# Patient Record
Sex: Female | Born: 1935 | Race: Black or African American | Hispanic: No | Smoking: Never smoker
Health system: Southern US, Community
[De-identification: ages and names within clinical notes are randomized; demographics above are authoritative.]

## PROBLEM LIST (undated history)

## (undated) DIAGNOSIS — I4891 Unspecified atrial fibrillation: Secondary | ICD-10-CM

## (undated) DIAGNOSIS — I5022 Chronic systolic (congestive) heart failure: Secondary | ICD-10-CM

## (undated) DIAGNOSIS — I639 Cerebral infarction, unspecified: Secondary | ICD-10-CM

## (undated) DIAGNOSIS — I10 Essential (primary) hypertension: Secondary | ICD-10-CM

## (undated) DIAGNOSIS — R001 Bradycardia, unspecified: Secondary | ICD-10-CM

## (undated) DIAGNOSIS — M109 Gout, unspecified: Secondary | ICD-10-CM

## (undated) DIAGNOSIS — E119 Type 2 diabetes mellitus without complications: Secondary | ICD-10-CM

## (undated) DIAGNOSIS — I350 Nonrheumatic aortic (valve) stenosis: Secondary | ICD-10-CM

## (undated) DIAGNOSIS — K219 Gastro-esophageal reflux disease without esophagitis: Secondary | ICD-10-CM

## (undated) HISTORY — DX: Unspecified atrial fibrillation: I48.91

## (undated) HISTORY — DX: Cerebral infarction, unspecified: I63.9

## (undated) HISTORY — DX: Nonrheumatic aortic (valve) stenosis: I35.0

## (undated) HISTORY — PX: CHOLECYSTECTOMY: SHX55

---

## 1999-04-24 ENCOUNTER — Emergency Department (HOSPITAL_COMMUNITY): Admission: EM | Admit: 1999-04-24 | Discharge: 1999-04-24 | Payer: Self-pay | Admitting: Emergency Medicine

## 1999-07-16 ENCOUNTER — Ambulatory Visit (HOSPITAL_COMMUNITY): Admission: RE | Admit: 1999-07-16 | Discharge: 1999-07-16 | Payer: Self-pay | Admitting: Internal Medicine

## 1999-07-16 ENCOUNTER — Encounter: Payer: Self-pay | Admitting: Internal Medicine

## 2001-07-14 ENCOUNTER — Emergency Department (HOSPITAL_COMMUNITY): Admission: EM | Admit: 2001-07-14 | Discharge: 2001-07-14 | Payer: Self-pay | Admitting: Emergency Medicine

## 2002-12-03 ENCOUNTER — Emergency Department (HOSPITAL_COMMUNITY): Admission: EM | Admit: 2002-12-03 | Discharge: 2002-12-03 | Payer: Self-pay | Admitting: Emergency Medicine

## 2003-06-24 ENCOUNTER — Emergency Department (HOSPITAL_COMMUNITY): Admission: EM | Admit: 2003-06-24 | Discharge: 2003-06-24 | Payer: Self-pay | Admitting: Emergency Medicine

## 2004-01-16 ENCOUNTER — Ambulatory Visit (HOSPITAL_COMMUNITY): Admission: RE | Admit: 2004-01-16 | Discharge: 2004-01-16 | Payer: Self-pay | Admitting: Internal Medicine

## 2006-06-02 ENCOUNTER — Inpatient Hospital Stay (HOSPITAL_COMMUNITY): Admission: AD | Admit: 2006-06-02 | Discharge: 2006-06-07 | Payer: Self-pay | Admitting: Internal Medicine

## 2006-06-03 ENCOUNTER — Ambulatory Visit: Payer: Self-pay | Admitting: Internal Medicine

## 2007-06-30 ENCOUNTER — Ambulatory Visit: Payer: Self-pay | Admitting: Internal Medicine

## 2007-07-13 ENCOUNTER — Ambulatory Visit: Payer: Self-pay

## 2007-07-13 ENCOUNTER — Encounter: Payer: Self-pay | Admitting: Internal Medicine

## 2007-07-13 ENCOUNTER — Ambulatory Visit: Payer: Self-pay | Admitting: Internal Medicine

## 2007-07-18 ENCOUNTER — Ambulatory Visit: Payer: Self-pay | Admitting: Cardiology

## 2007-07-21 ENCOUNTER — Ambulatory Visit: Payer: Self-pay | Admitting: Cardiology

## 2007-07-26 ENCOUNTER — Ambulatory Visit: Payer: Self-pay | Admitting: Cardiovascular Disease

## 2007-08-05 ENCOUNTER — Ambulatory Visit: Payer: Self-pay | Admitting: Cardiology

## 2007-08-23 ENCOUNTER — Ambulatory Visit: Payer: Self-pay | Admitting: Cardiology

## 2007-08-30 ENCOUNTER — Ambulatory Visit: Payer: Self-pay | Admitting: Cardiovascular Disease

## 2007-09-06 ENCOUNTER — Ambulatory Visit: Payer: Self-pay | Admitting: Cardiology

## 2007-09-20 ENCOUNTER — Ambulatory Visit: Payer: Self-pay | Admitting: Cardiology

## 2007-09-28 ENCOUNTER — Ambulatory Visit: Payer: Self-pay | Admitting: Internal Medicine

## 2007-09-28 LAB — CONVERTED CEMR LAB
BUN: 19 mg/dL (ref 6–23)
Basophils Relative: 0.4 % (ref 0.0–3.0)
CO2: 26 meq/L (ref 19–32)
Chloride: 109 meq/L (ref 96–112)
Creatinine, Ser: 1.1 mg/dL (ref 0.4–1.2)
Eosinophils Absolute: 0.2 10*3/uL (ref 0.0–0.7)
Eosinophils Relative: 2.3 % (ref 0.0–5.0)
GFR calc non Af Amer: 51 mL/min
Glucose, Bld: 104 mg/dL — ABNORMAL HIGH (ref 70–99)
Lymphocytes Relative: 33.2 % (ref 12.0–46.0)
MCV: 89.3 fL (ref 78.0–100.0)
Monocytes Relative: 6.5 % (ref 3.0–12.0)
Neutrophils Relative %: 57.6 % (ref 43.0–77.0)
Platelets: 281 10*3/uL (ref 150–400)
Potassium: 4 meq/L (ref 3.5–5.1)
RBC: 3.67 M/uL — ABNORMAL LOW (ref 3.87–5.11)
WBC: 7.7 10*3/uL (ref 4.5–10.5)

## 2007-10-07 ENCOUNTER — Ambulatory Visit: Payer: Self-pay | Admitting: Cardiology

## 2007-10-28 ENCOUNTER — Ambulatory Visit: Payer: Self-pay | Admitting: Cardiology

## 2007-11-04 ENCOUNTER — Ambulatory Visit: Payer: Self-pay | Admitting: Cardiology

## 2007-11-14 ENCOUNTER — Ambulatory Visit: Payer: Self-pay | Admitting: Cardiology

## 2007-11-23 ENCOUNTER — Ambulatory Visit: Payer: Self-pay | Admitting: Cardiovascular Disease

## 2007-12-02 ENCOUNTER — Ambulatory Visit: Payer: Self-pay | Admitting: Internal Medicine

## 2007-12-09 ENCOUNTER — Ambulatory Visit: Payer: Self-pay | Admitting: Cardiovascular Disease

## 2007-12-20 ENCOUNTER — Ambulatory Visit: Payer: Self-pay | Admitting: Cardiovascular Disease

## 2007-12-28 ENCOUNTER — Ambulatory Visit: Payer: Self-pay | Admitting: Internal Medicine

## 2008-01-11 ENCOUNTER — Ambulatory Visit: Payer: Self-pay | Admitting: Cardiovascular Disease

## 2008-01-25 ENCOUNTER — Ambulatory Visit: Payer: Self-pay | Admitting: Cardiology

## 2008-02-01 ENCOUNTER — Ambulatory Visit: Payer: Self-pay | Admitting: Cardiology

## 2008-02-08 ENCOUNTER — Ambulatory Visit: Payer: Self-pay | Admitting: Internal Medicine

## 2008-03-01 ENCOUNTER — Ambulatory Visit: Payer: Self-pay | Admitting: Cardiovascular Disease

## 2008-03-07 ENCOUNTER — Ambulatory Visit: Payer: Self-pay | Admitting: Cardiology

## 2008-03-19 ENCOUNTER — Ambulatory Visit: Payer: Self-pay | Admitting: Cardiology

## 2008-03-28 ENCOUNTER — Ambulatory Visit: Payer: Self-pay | Admitting: Internal Medicine

## 2008-04-04 ENCOUNTER — Ambulatory Visit: Payer: Self-pay | Admitting: Cardiology

## 2008-04-13 ENCOUNTER — Ambulatory Visit: Payer: Self-pay | Admitting: Cardiology

## 2008-04-24 ENCOUNTER — Ambulatory Visit: Payer: Self-pay | Admitting: Cardiovascular Disease

## 2008-05-08 ENCOUNTER — Ambulatory Visit: Payer: Self-pay | Admitting: Cardiology

## 2008-05-18 ENCOUNTER — Ambulatory Visit: Payer: Self-pay | Admitting: Cardiology

## 2008-05-29 ENCOUNTER — Ambulatory Visit: Payer: Self-pay | Admitting: Cardiology

## 2008-06-13 ENCOUNTER — Ambulatory Visit: Payer: Self-pay | Admitting: Internal Medicine

## 2008-06-19 ENCOUNTER — Encounter: Payer: Self-pay | Admitting: *Deleted

## 2008-06-27 ENCOUNTER — Ambulatory Visit: Payer: Self-pay

## 2008-06-27 LAB — CONVERTED CEMR LAB
POC INR: 1.3
Protime: 13.9

## 2008-07-11 ENCOUNTER — Ambulatory Visit: Payer: Self-pay | Admitting: Internal Medicine

## 2008-07-11 LAB — CONVERTED CEMR LAB: POC INR: 1.7

## 2008-07-25 ENCOUNTER — Encounter: Payer: Self-pay | Admitting: *Deleted

## 2008-08-01 ENCOUNTER — Ambulatory Visit: Payer: Self-pay | Admitting: Internal Medicine

## 2008-08-01 LAB — CONVERTED CEMR LAB
POC INR: 2
Prothrombin Time: 17.3 s

## 2008-08-15 ENCOUNTER — Ambulatory Visit: Payer: Self-pay | Admitting: Cardiology

## 2008-08-31 ENCOUNTER — Ambulatory Visit: Payer: Self-pay | Admitting: Internal Medicine

## 2008-08-31 LAB — CONVERTED CEMR LAB: POC INR: 4.9

## 2008-09-07 ENCOUNTER — Ambulatory Visit: Payer: Self-pay | Admitting: Cardiology

## 2008-09-21 ENCOUNTER — Ambulatory Visit: Payer: Self-pay | Admitting: Internal Medicine

## 2008-10-05 ENCOUNTER — Ambulatory Visit: Payer: Self-pay | Admitting: Internal Medicine

## 2008-10-05 LAB — CONVERTED CEMR LAB: POC INR: 1.3

## 2008-10-19 ENCOUNTER — Ambulatory Visit: Payer: Self-pay | Admitting: Internal Medicine

## 2008-10-19 LAB — CONVERTED CEMR LAB: POC INR: 1.3

## 2008-10-24 ENCOUNTER — Ambulatory Visit: Payer: Self-pay | Admitting: Internal Medicine

## 2008-10-24 DIAGNOSIS — I4891 Unspecified atrial fibrillation: Secondary | ICD-10-CM | POA: Insufficient documentation

## 2008-10-24 DIAGNOSIS — I359 Nonrheumatic aortic valve disorder, unspecified: Secondary | ICD-10-CM | POA: Insufficient documentation

## 2008-10-24 DIAGNOSIS — I498 Other specified cardiac arrhythmias: Secondary | ICD-10-CM

## 2008-10-24 DIAGNOSIS — I119 Hypertensive heart disease without heart failure: Secondary | ICD-10-CM | POA: Insufficient documentation

## 2008-11-02 ENCOUNTER — Ambulatory Visit: Payer: Self-pay | Admitting: Internal Medicine

## 2008-11-07 ENCOUNTER — Telehealth (INDEPENDENT_AMBULATORY_CARE_PROVIDER_SITE_OTHER): Payer: Self-pay | Admitting: *Deleted

## 2008-11-08 ENCOUNTER — Ambulatory Visit: Payer: Self-pay

## 2008-11-08 ENCOUNTER — Ambulatory Visit: Payer: Self-pay | Admitting: Cardiology

## 2008-11-08 ENCOUNTER — Encounter: Payer: Self-pay | Admitting: Internal Medicine

## 2008-11-08 ENCOUNTER — Ambulatory Visit (HOSPITAL_COMMUNITY): Admission: RE | Admit: 2008-11-08 | Discharge: 2008-11-08 | Payer: Self-pay | Admitting: Cardiology

## 2008-11-16 ENCOUNTER — Ambulatory Visit: Payer: Self-pay | Admitting: Internal Medicine

## 2008-11-16 LAB — CONVERTED CEMR LAB: POC INR: 2.7

## 2008-12-04 ENCOUNTER — Ambulatory Visit: Payer: Self-pay | Admitting: Internal Medicine

## 2008-12-04 ENCOUNTER — Ambulatory Visit: Payer: Self-pay | Admitting: Cardiology

## 2008-12-11 ENCOUNTER — Ambulatory Visit: Payer: Self-pay | Admitting: Cardiovascular Disease

## 2008-12-21 ENCOUNTER — Ambulatory Visit: Payer: Self-pay | Admitting: Cardiovascular Disease

## 2009-01-09 ENCOUNTER — Ambulatory Visit: Payer: Self-pay | Admitting: Internal Medicine

## 2009-01-09 LAB — CONVERTED CEMR LAB: POC INR: 3.1

## 2009-02-12 ENCOUNTER — Ambulatory Visit: Payer: Self-pay | Admitting: Cardiovascular Disease

## 2009-02-12 LAB — CONVERTED CEMR LAB: POC INR: 1.4

## 2009-02-26 ENCOUNTER — Ambulatory Visit: Payer: Self-pay | Admitting: Cardiology

## 2009-03-12 ENCOUNTER — Ambulatory Visit: Payer: Self-pay | Admitting: Cardiology

## 2009-03-12 LAB — CONVERTED CEMR LAB: POC INR: 1.5

## 2009-03-22 ENCOUNTER — Ambulatory Visit: Payer: Self-pay | Admitting: Cardiology

## 2009-03-22 LAB — CONVERTED CEMR LAB: POC INR: 2.2

## 2009-04-04 ENCOUNTER — Ambulatory Visit: Payer: Self-pay | Admitting: Internal Medicine

## 2009-04-04 LAB — CONVERTED CEMR LAB: POC INR: 1.8

## 2009-05-02 ENCOUNTER — Ambulatory Visit: Payer: Self-pay | Admitting: Internal Medicine

## 2009-05-30 ENCOUNTER — Ambulatory Visit: Payer: Self-pay | Admitting: Internal Medicine

## 2009-05-30 LAB — CONVERTED CEMR LAB: POC INR: 1.1

## 2009-06-06 ENCOUNTER — Ambulatory Visit: Payer: Self-pay | Admitting: Internal Medicine

## 2009-06-21 ENCOUNTER — Ambulatory Visit: Payer: Self-pay | Admitting: Cardiovascular Disease

## 2009-06-28 ENCOUNTER — Ambulatory Visit: Payer: Self-pay | Admitting: Cardiovascular Disease

## 2009-07-05 ENCOUNTER — Ambulatory Visit: Payer: Self-pay | Admitting: Cardiology

## 2009-07-05 LAB — CONVERTED CEMR LAB: POC INR: 2.5

## 2009-07-26 ENCOUNTER — Ambulatory Visit: Payer: Self-pay | Admitting: Cardiology

## 2009-07-26 LAB — CONVERTED CEMR LAB: POC INR: 3

## 2009-08-23 ENCOUNTER — Ambulatory Visit: Payer: Self-pay | Admitting: Cardiovascular Disease

## 2009-09-16 ENCOUNTER — Ambulatory Visit: Payer: Self-pay | Admitting: Cardiology

## 2009-09-16 LAB — CONVERTED CEMR LAB: POC INR: 2.2

## 2009-10-14 ENCOUNTER — Ambulatory Visit: Payer: Self-pay | Admitting: Cardiovascular Disease

## 2009-10-14 LAB — CONVERTED CEMR LAB: POC INR: 3.2

## 2009-11-11 ENCOUNTER — Ambulatory Visit: Payer: Self-pay | Admitting: Internal Medicine

## 2009-11-11 LAB — CONVERTED CEMR LAB: POC INR: 4.7

## 2009-11-21 ENCOUNTER — Ambulatory Visit: Payer: Self-pay | Admitting: Cardiology

## 2009-12-02 ENCOUNTER — Ambulatory Visit: Payer: Self-pay | Admitting: Internal Medicine

## 2009-12-03 ENCOUNTER — Observation Stay (HOSPITAL_COMMUNITY)
Admission: EM | Admit: 2009-12-03 | Discharge: 2009-12-05 | Payer: Self-pay | Source: Home / Self Care | Admitting: Emergency Medicine

## 2009-12-06 ENCOUNTER — Encounter: Payer: Self-pay | Admitting: Cardiology

## 2009-12-10 ENCOUNTER — Ambulatory Visit (HOSPITAL_COMMUNITY): Admission: RE | Admit: 2009-12-10 | Discharge: 2009-12-10 | Payer: Self-pay | Admitting: Internal Medicine

## 2009-12-13 ENCOUNTER — Encounter: Payer: Self-pay | Admitting: Cardiology

## 2009-12-13 LAB — CONVERTED CEMR LAB: Prothrombin Time: 34.7 s

## 2009-12-18 ENCOUNTER — Ambulatory Visit: Payer: Self-pay | Admitting: Internal Medicine

## 2009-12-18 LAB — CONVERTED CEMR LAB: POC INR: 3.2

## 2010-01-02 ENCOUNTER — Ambulatory Visit: Payer: Self-pay | Admitting: Internal Medicine

## 2010-01-30 ENCOUNTER — Ambulatory Visit: Admission: RE | Admit: 2010-01-30 | Discharge: 2010-01-30 | Payer: Self-pay | Source: Home / Self Care

## 2010-01-30 LAB — CONVERTED CEMR LAB: POC INR: 2.6

## 2010-02-16 LAB — CONVERTED CEMR LAB: POC INR: 2.3

## 2010-02-20 NOTE — Medication Information (Signed)
Summary: rov/sp  Anticoagulant Therapy  Managed by: Bethena Midget, RN, BSN Referring MD: Sherryl Manges MD Supervising MD: Excell Seltzer MD, Casimiro Needle Indication 1: Atrial Fibrillation (ICD-427.31) Lab Used: [facility]  Site: [facility] INR POC 1.9 INR RANGE 2 - 3  Dietary changes: no    Health status changes: no    Bleeding/hemorrhagic complications: no    Recent/future hospitalizations: no    Any changes in medication regimen? no    Recent/future dental: no  Any missed doses?: no       Is patient compliant with meds? yes       Allergies: 1)  ! Procardia  Anticoagulation Management History:      The patient is taking warfarin and comes in today for a routine follow up visit.  Positive risk factors for bleeding include an age of 75 years or older.  The bleeding index is 'intermediate risk'.  Positive CHADS2 values include Age > 34 years old.  The start date was 07/11/2007.  Her last INR was 6.1 RATIO.  Anticoagulation responsible Veronica Powers: Excell Seltzer MD, Casimiro Needle.  INR POC: 1.9.  Cuvette Lot#: [id number].  Exp: 10/2010.    Anticoagulation Management Assessment/Plan:      The patient's current anticoagulation dose is Coumadin 5 mg tabs: Take as directed by coumadin clinic..  The target INR is 2 - 3.  The next INR is due 09/16/2009.  Anticoagulation instructions were given to patient.  Results were reviewed/authorized by Bethena Midget, RN, BSN.  She was notified by Bethena Midget, RN, BSN.         Prior Anticoagulation Instructions: INR 3.0  Continue same dose of 1 tablet every day except 1 1/2 tablets on Tuesday, Thursday and Saturday.  Eat an extra serving of greens today.   Current Anticoagulation Instructions: INR 1.9 Today take 1.5 pills then resume 1 pill everyday except 1.5 pills on Tuesdays, Thursdays and Saturdays. Recheck in 3 weeks.

## 2010-02-20 NOTE — Medication Information (Signed)
Summary: rov/sp   Anticoagulant Therapy  Managed by: Weston Brass, PharmD Referring MD: Sherryl Manges MD Supervising MD: Gala Romney MD, Reuel Boom Indication 1: Atrial Fibrillation (ICD-427.31) Lab Used: [facility] [place] Site: [facility] INR POC 3.2 INR RANGE 2 - 3  Dietary changes: no    Health status changes: no    Bleeding/hemorrhagic complications: no    Recent/future hospitalizations: no    Any changes in medication regimen? yes       Details: starting metoprolol today  Recent/future dental: no  Any missed doses?: no       Is patient compliant with meds? yes       Allergies: 1)  ! Procardia  Anticoagulation Management History:      The patient is taking warfarin and comes in today for a routine follow up visit.  Positive risk factors for bleeding include an age of 75 years or older.  The bleeding index is 'intermediate risk'.  Positive CHADS2 values include Age > 80 years old.  The start date was 07/11/2007.  Her last INR was 6.1 RATIO.  Anticoagulation responsible provider: Bensimhon MD, Reuel Boom.  INR POC: 3.2.  Exp: 12/2010.    Anticoagulation Management Assessment/Plan:      The patient's current anticoagulation dose is Coumadin 5 mg tabs: Take as directed by coumadin clinic..  The target INR is 2 - 3.  The next INR is due 01/02/2010.  Anticoagulation instructions were given to patient.  Results were reviewed/authorized by Weston Brass, PharmD.  She was notified by Weston Brass PharmD.         Prior Anticoagulation Instructions: INR 2.9  Results received from [facility] with [facility].  Pt being discharged from [facility] today.  Spoke with caregiver in home.  Continue same dose of 1 tablet every day except 1 1/2 tablets on Monday, Wednesday and Friday.  Pt has appt for [facility]in Clinic on 11/30.   Current Anticoagulation Instructions: INR 3.2  Skip today's dose of Coumadin then resume same dose of 1 tablet every day except 1 1/2 tablest on Monday, Wednesday and Friday.  Recheck INR in 2 weeks.

## 2010-02-20 NOTE — Medication Information (Signed)
Summary: Veronica Powers  Anticoagulant Therapy  Managed by: Bethena Midget, RN, BSN Referring MD: Sherryl Manges MD PCP: Lubertha Sayres MD: Graciela Husbands MD, Viviann Spare Indication 1: Atrial Fibrillation (ICD-427.31) Lab Used: [facility] [place] Site: [facility] INR POC 2.7 INR RANGE 2 - 3  Dietary changes: no    Health status changes: no    Bleeding/hemorrhagic complications: no    Recent/future hospitalizations: no    Any changes in medication regimen? yes       Details: Metoprolol added  on  11/30  Recent/future dental: no  Any missed doses?: no       Is patient compliant with meds? yes       Allergies: 1)  ! Procardia  Anticoagulation Management History:      The patient is taking warfarin and comes in today for a routine follow up visit.  Positive risk factors for bleeding include an age of 75 years or older.  The bleeding index is 'intermediate risk'.  Positive CHADS2 values include Age > 44 years old.  The start date was 07/11/2007.  Her last INR was 6.1 RATIO.  Anticoagulation responsible provider: Graciela Husbands MD, Viviann Spare.  INR POC: 2.7.  Cuvette Lot#: [id number].  Exp: 02/2011.    Anticoagulation Management Assessment/Plan:      The patient's current anticoagulation dose is Coumadin 5 mg tabs: Take as directed by coumadin clinic..  The target INR is 2 - 3.  The next INR is due 01/30/2010.  Anticoagulation instructions were given to patient.  Results were reviewed/authorized by Bethena Midget, RN, BSN.  She was notified by Bethena Midget, RN, BSN.         Prior Anticoagulation Instructions: INR 3.2  Skip today's dose of Coumadin then resume same dose of 1 tablet every day except 1 1/2 tablest on Monday, Wednesday and Friday.  Recheck INR in 2 weeks.   Current Anticoagulation Instructions: INR 2.7 Continue 1 pill everyday except 1.5 pills on Mondays, Wednesdays and Fridays. Recheck in 4 weeks.

## 2010-02-20 NOTE — Medication Information (Signed)
Summary: rov/ewj  Anticoagulant Therapy  Managed by: Bethena Midget, RN, BSN Referring MD: Sherryl Manges MD Supervising MD: Daleen Squibb MD, Maisie Fus Indication 1: Atrial Fibrillation (ICD-427.31) Lab Used: [facility] [place] Site: [facility] INR POC 1.5 INR RANGE 2 - 3  Dietary changes: no    Health status changes: no    Bleeding/hemorrhagic complications: no    Recent/future hospitalizations: no    Any changes in medication regimen? no    Recent/future dental: no  Any missed doses?: no       Is patient compliant with meds? yes       Allergies: 1)  ! Procardia  Anticoagulation Management History:      The patient is taking warfarin and comes in today for a routine follow up visit.  Positive risk factors for bleeding include an age of 75 years or older.  The bleeding index is 'intermediate risk'.  Positive CHADS2 values include Age > 84 years old.  The start date was 07/11/2007.  Her last INR was 6.1 RATIO.  Anticoagulation responsible provider: Daleen Squibb MD, Maisie Fus.  INR POC: 1.5.  Cuvette Lot#: [id number].  Exp: 04/2010.    Anticoagulation Management Assessment/Plan:      The patient's current anticoagulation dose is Coumadin 5 mg tabs: Take as directed by coumadin clinic..  The target INR is 2 - 3.  The next INR is due 03/22/2009.  Anticoagulation instructions were given to patient.  Results were reviewed/authorized by Bethena Midget, RN, BSN.  She was notified by Bethena Midget, RN, BSN.         Prior Anticoagulation Instructions: INR 1.8 Today take 1 1/2 tablet then change dose to 1 tablet everyday. Recheck in 2 weeks.   Current Anticoagulation Instructions: INR 1.5 Today 1 1/2 tablets then change dose to 1 tablet everyday except 1 1/2 tablets on Wednesdays. Recheck in 10 days.

## 2010-02-20 NOTE — Medication Information (Signed)
Summary: rov/ewj  Anticoagulant Therapy  Managed by: Lyna Poser, PharmD Referring MD: Sherryl Manges MD Supervising MD: Daleen Squibb MD, Maisie Fus Indication 1: Atrial Fibrillation (ICD-427.31) Lab Used: [facility] [place] Site: [facility] INR POC 1.3 INR RANGE 2 - 3  Dietary changes: no    Health status changes: no    Bleeding/hemorrhagic complications: no    Recent/future hospitalizations: no    Any changes in medication regimen? no    Recent/future dental: no  Any missed doses?: no       Is patient compliant with meds? yes       Allergies: 1)  ! Procardia  Anticoagulation Management History:      The patient is taking warfarin and comes in today for a routine follow up visit.  Positive risk factors for bleeding include an age of 16 years or older.  The bleeding index is 'intermediate risk'.  Positive CHADS2 values include Age > 108 years old.  The start date was 07/11/2007.  Her last INR was 6.1 RATIO.  Anticoagulation responsible provider: Daleen Squibb MD, Maisie Fus.  INR POC: 1.3.  Cuvette Lot#: [id number].  Exp: 12/2010.    Anticoagulation Management Assessment/Plan:      The patient's current anticoagulation dose is Coumadin 5 mg tabs: Take as directed by coumadin clinic..  The target INR is 2 - 3.  The next INR is due 12/02/2009.  Anticoagulation instructions were given to patient.  Results were reviewed/authorized by Lyna Poser, PharmD.         Prior Anticoagulation Instructions: INR 4.7  Skip today and tomorrow's dosage of Coumadin, then start taking 1 tablet daily except 1.5 tablets on Saturdays.  Recheck in 10 days.    Current Anticoagulation Instructions: INR 1.3 Today take 1.5 pills then change dose to 1 pill everyday except 1.5 pills on Mondays and Fridays. Recheck in 10 days.

## 2010-02-20 NOTE — Medication Information (Signed)
Summary: rov/jm  Anticoagulant Therapy  Managed by: Cloyde Reams, RN, BSN Referring MD: Sherryl Manges MD Supervising MD: Johney Frame MD, Fayrene Fearing Indication 1: Atrial Fibrillation (ICD-427.31) Lab Used: [facility] [place] Site: [facility] INR POC 4.7 INR RANGE 2 - 3  Dietary changes: no    Health status changes: no    Bleeding/hemorrhagic complications: no    Recent/future hospitalizations: no    Any changes in medication regimen? no    Recent/future dental: no  Any missed doses?: no       Is patient compliant with meds? yes       Allergies: 1)  ! Procardia  Anticoagulation Management History:      The patient is taking warfarin and comes in today for a routine follow up visit.  Positive risk factors for bleeding include an age of 75 years or older.  The bleeding index is 'intermediate risk'.  Positive CHADS2 values include Age > 40 years old.  The start date was 07/11/2007.  Her last INR was 6.1 RATIO.  Anticoagulation responsible provider: Allred MD, Fayrene Fearing.  INR POC: 4.7.  Cuvette Lot#: [id number].  Exp: 12/2010.    Anticoagulation Management Assessment/Plan:      The patient's current anticoagulation dose is Coumadin 5 mg tabs: Take as directed by coumadin clinic..  The target INR is 2 - 3.  The next INR is due 11/21/2009.  Anticoagulation instructions were given to patient.  Results were reviewed/authorized by Cloyde Reams, RN, BSN.  She was notified by Cloyde Reams RN.         Prior Anticoagulation Instructions: INR 3.2  Do not take today's dose of Coumadin, then resume one tablet every day except for one and one-half tablets on Tuesday, Thursday, and Saturday.  Recheck in 4 weeks.    Current Anticoagulation Instructions: INR 4.7  Skip today and tomorrow's dosage of Coumadin, then start taking 1 tablet daily except 1.5 tablets on Saturdays.  Recheck in 10 days.

## 2010-02-20 NOTE — Medication Information (Signed)
Summary: rov coumadin clinic  Anticoagulant Therapy  Managed by: Weston Brass, PharmD Referring MD: Sherryl Manges MD Supervising MD: Johney Frame MD, Fayrene Fearing Indication 1: Atrial Fibrillation (ICD-427.31) Lab Used: [facility] [place] Site: [facility] INR POC 1.1 INR RANGE 2 - 3  Dietary changes: no    Health status changes: no    Bleeding/hemorrhagic complications: no    Recent/future hospitalizations: no    Any changes in medication regimen? no    Recent/future dental: no  Any missed doses?: no       Is patient compliant with meds? yes       Allergies: 1)  ! Procardia  Anticoagulation Management History:      The patient is taking warfarin and comes in today for a routine follow up visit.  Positive risk factors for bleeding include an age of 75 years or older.  The bleeding index is 'intermediate risk'.  Positive CHADS2 values include Age > 75 years old.  The start date was 07/11/2007.  Her last INR was 6.1 RATIO.  Anticoagulation responsible provider: Charisa Twitty MD, Fayrene Fearing.  INR POC: 1.1.  Cuvette Lot#: [id number].  Exp: 08/2010.    Anticoagulation Management Assessment/Plan:      The patient's current anticoagulation dose is Coumadin 5 mg tabs: Take as directed by coumadin clinic..  The target INR is 2 - 3.  The next INR is due 06/07/2009.  Anticoagulation instructions were given to patient.  Results were reviewed/authorized by Weston Brass, PharmD.  She was notified by Weston Brass PharmD.         Prior Anticoagulation Instructions: INR=2.5  will continue on present dosing schedule--(5mg ) 1 tablet on sun,mon,tues,thur,fri sat--1 and 1/2 tablets on wednesday  Current Anticoagulation Instructions: INR 1.1   Take 2 tablets today and tomorrow then resume same dose of 1 tablet every day except 1 1/2 tablets on Wednesday

## 2010-02-20 NOTE — Medication Information (Signed)
Summary: rov/ewj   Anticoagulant Therapy  Managed by: Cloyde Reams, RN, BSN Referring MD: Sherryl Manges MD Supervising MD: Riley Kill MD, Maisie Fus Indication 1: Atrial Fibrillation (ICD-427.31) Lab Used: [facility] [place] Site: [facility] INR POC 3.2 INR RANGE 2 - 3  Dietary changes: no    Health status changes: no    Bleeding/hemorrhagic complications: no    Recent/future hospitalizations: no    Any changes in medication regimen? no    Recent/future dental: yes     Details: teeth extracted two weeks  Any missed doses?: no       Is patient compliant with meds? yes       Allergies: 1)  ! Procardia  Anticoagulation Management History:      The patient is taking warfarin and comes in today for a routine follow up visit.  Positive risk factors for bleeding include an age of 75 years or older.  The bleeding index is 'intermediate risk'.  Positive CHADS2 values include Age > 95 years old.  The start date was 07/11/2007.  Her last INR was 6.1 RATIO.  Anticoagulation responsible Ricca Melgarejo: Riley Kill MD, Maisie Fus.  INR POC: 3.2.  Exp: 10/2010.    Anticoagulation Management Assessment/Plan:      The patient's current anticoagulation dose is Coumadin 5 mg tabs: Take as directed by coumadin clinic..  The target INR is 2 - 3.  The next INR is due 11/11/2009.  Anticoagulation instructions were given to patient.  Results were reviewed/authorized by Cloyde Reams, RN, BSN.  She was notified by Kennieth Francois.         Prior Anticoagulation Instructions: INR 2.2  Continue on same dosage 1 tablet daily except 1.5 tablets on Tuesdays, Thursdays, and Saturdays.  Recheck in 4 weeks.    Current Anticoagulation Instructions: INR 3.2  Do not take today's dose of Coumadin, then resume one tablet every day except for one and one-half tablets on Tuesday, Thursday, and Saturday.  Recheck in 4 weeks.

## 2010-02-20 NOTE — Medication Information (Signed)
Summary: Coumadin Clinic   Anticoagulant Therapy  Managed by: Weston Brass, PharmD Referring MD: Sherryl Manges MD Supervising MD: Myrtis Ser MD, Tinnie Gens Indication 1: Atrial Fibrillation (ICD-427.31) Lab Used: [facility] [place] Site: [facility] PT 34.7 INR POC 2.9 INR RANGE 2 - 3         Is patient compliant with meds? yes       Allergies: 1)  ! Procardia  Anticoagulation Management History:      The patient is taking warfarin and comes in today for a routine follow up visit.  Positive risk factors for bleeding include an age of 75 years or older.  The bleeding index is 'intermediate risk'.  Positive CHADS2 values include Age > 75 years old.  The start date was 07/11/2007.  Her last INR was 6.1 RATIO.  Prothrombin time is 34.7.  Anticoagulation responsible provider: Myrtis Ser MD, Tinnie Gens.  INR POC: 2.9.  Exp: 12/2010.    Anticoagulation Management Assessment/Plan:      The patient's current anticoagulation dose is Coumadin 5 mg tabs: Take as directed by coumadin clinic..  The target INR is 2 - 3.  The next INR is due 12/18/2009.  Anticoagulation instructions were given to patient.  Results were reviewed/authorized by Weston Brass, PharmD.  She was notified by Weston Brass PharmD.         Prior Anticoagulation Instructions: INR 2.2  Continue current dosing schedule of 1.5 tablets on Monday, Wednesday, and Friday and 1 tablet all other days.  Recheck in 1 week.  Orders given to [facility] with [facility], she was in the home and relayed instructions to patient.   Current Anticoagulation Instructions: INR 2.9  Results received from [facility].  Pt being discharged from [facility] today.  Spoke with caregiver in home.  Continue same dose of 1 tablet every day except 1 1/2 tablets on Monday, Wednesday and Friday.  Pt has appt for Coumadin Clinic on 11/30.

## 2010-02-20 NOTE — Medication Information (Signed)
Summary: rov/ewj  Anticoagulant Therapy  Managed by: Lew Dawes, PharmD Candidate Referring MD: Sherryl Manges MD Supervising MD: Excell Seltzer MD, Casimiro Needle Indication 1: Atrial Fibrillation (ICD-427.31) Lab Used: [facility] [place] [place] Site: [facility] INR POC 1.4 INR RANGE 2 - 3  Dietary changes: no    Health status changes: no    Bleeding/hemorrhagic complications: no    Recent/future hospitalizations: no    Any changes in medication regimen? no    Recent/future dental: no  Any missed doses?: no       Is patient compliant with meds? yes       Allergies: 1)  ! Procardia  Anticoagulation Management History:      The patient is taking warfarin and comes in today for a routine follow up visit.  Positive risk factors for bleeding include an age of 20 years or older.  The bleeding index is 'intermediate risk'.  Positive CHADS2 values include Age > 31 years old.  The start date was 07/11/2007.  Her last INR was 6.1 RATIO.  Anticoagulation responsible provider: Excell Seltzer MD, Casimiro Needle.  INR POC: 1.4.  Cuvette Lot#: [id number].  Exp: 04/2010.    Anticoagulation Management Assessment/Plan:      The patient's current anticoagulation dose is Coumadin 5 mg tabs: Take as directed by coumadin clinic..  The target INR is 2 - 3.  The next INR is due 02/22/2009.  Anticoagulation instructions were given to patient.  Results were reviewed/authorized by Lew Dawes, PharmD Candidate.  She was notified by Lew Dawes, Pharmd Candidate.         Prior Anticoagulation Instructions: INR 3.1  Take 1/2 tablet tonight, then start taking 1 tablet daily except 1/2 tablet on Tuesdays and Saturdays.   Recheck in 2-3 weeks.    Current Anticoagulation Instructions: INR 1.4  Take 1 tablets today then increase dose to 1 tablet daily except 0.5 tablet on Wednesdays.  Recheck in 10 days.

## 2010-02-20 NOTE — Medication Information (Signed)
Summary: rov/tm  Anticoagulant Therapy  Managed by: Weston Brass, PharmD Referring MD: Sherryl Manges MD Supervising MD: Juanda Chance MD, Dillyn Menna Indication 1: Atrial Fibrillation (ICD-427.31) Lab Used: [facility] [place] Site: [facility] INR POC 2.5 INR RANGE 2 - 3  Dietary changes: no    Health status changes: no    Bleeding/hemorrhagic complications: no    Recent/future hospitalizations: no    Any changes in medication regimen? no    Recent/future dental: no  Any missed doses?: no       Is patient compliant with meds? yes       Allergies: 1)  ! Procardia  Anticoagulation Management History:      The patient is taking warfarin and comes in today for a routine follow up visit.  Positive risk factors for bleeding include an age of 5 years or older.  The bleeding index is 'intermediate risk'.  Positive CHADS2 values include Age > 80 years old.  The start date was 07/11/2007.  Her last INR was 6.1 RATIO.  Anticoagulation responsible provider: Juanda Chance MD, Smitty Cords.  INR POC: 2.5.  Cuvette Lot#: [id number].  Exp: 08/2010.    Anticoagulation Management Assessment/Plan:      The patient's current anticoagulation dose is Coumadin 5 mg tabs: Take as directed by coumadin clinic..  The target INR is 2 - 3.  The next INR is due 07/26/2009.  Anticoagulation instructions were given to patient.  Results were reviewed/authorized by Weston Brass, PharmD.  She was notified by Weston Brass PharmD.         Prior Anticoagulation Instructions: INR 2.7 Continue 1 pill everyday except 1 1/2 pills on Tuesdays, Thursdays and Saturdays. Recheck in one week.   Current Anticoagulation Instructions: INR 2.5  Continue same dose of 1 tablet every day except 1 1/2 tablets on Tuesday, Thursday, and Saturday.

## 2010-02-20 NOTE — Medication Information (Signed)
Summary: rov/sp  Anticoagulant Therapy  Managed by: Charolotte Eke, PharmD Referring MD: Sherryl Manges MD Supervising MD: Ladona Ridgel MD, Sharlot Gowda Indication 1: Atrial Fibrillation (ICD-427.31) Lab Used: [facility] [place] Site: [facility] INR POC 1.9 INR RANGE 2 - 3  Dietary changes: no    Health status changes: no    Bleeding/hemorrhagic complications: no    Recent/future hospitalizations: no    Any changes in medication regimen? no    Recent/future dental: no  Any missed doses?: no       Is patient compliant with meds? yes       Current Medications (verified): 1)  Coumadin 5 Mg Tabs (Warfarin Sodium) .... Take As Directed By Coumadin Clinic. 2)  Brimonidine Tartrate 0.15 % Soln (Brimonidine Tartrate) .... Both Eyes Twice Daily 3)  Pilocarpine Hcl 4 % Soln (Pilocarpine Hcl) .... One Drop Both Eyes 4 Times A Day 4)  Azopt 1 % Susp (Brinzolamide) .... One Drop Both Eyes Twice Daily 5)  Alphagan P 0.15 % Soln (Brimonidine Tartrate) .... One Drop Both Eyes Twice Daily 6)  Refresh Plus 0.5 % Soln (Carboxymethylcellulose Sodium) .... One Drop Both Eyes As Needed 7)  Lumigan 0.03 % Soln (Bimatoprost) .... One Drop Both Eyes At Night 8)  Oxybutynin Chloride 10 Mg Xr24h-Tab (Oxybutynin Chloride) .... Take One By Mouth Once Daily As Needed 9)  Allopurinol 100 Mg Tabs (Allopurinol) .Marland Kitchen.. 1 Tab By Mouth Once Daily 10)  Guanfacine Hcl 2 Mg Tabs (Guanfacine Hcl) .... Take 1 Tab By Mouth At Bedtime 11)  Lipitor 20 Mg Tabs (Atorvastatin Calcium) .... Take One Tablet By Mouth Daily. 12)  Indomethacin 25 Mg Caps (Indomethacin) .... Take 1 Tab By Mouth Once Daily As Needed For Gout 13)  Lasix 40 Mg Tabs (Furosemide) .Marland Kitchen.. 1 Tab By Mouth Once Daily 14)  Amlodipine Besy-Benazepril Hcl 10-20 Mg Caps (Amlodipine Besy-Benazepril Hcl) .... Take One Capsule By Mouth Daily 15)  Hydralazine Hcl 10 Mg Tabs (Hydralazine Hcl) .... Take One Tablet Two Times A Day  Allergies (verified): 1)  !  Procardia  Anticoagulation Management History:      The patient is taking warfarin and comes in today for a routine follow up visit.  Positive risk factors for bleeding include an age of 75 years or older.  The bleeding index is 'intermediate risk'.  Positive CHADS2 values include Age > 62 years old.  The start date was 07/11/2007.  Her last INR was 6.1 RATIO.  Anticoagulation responsible provider: Ladona Ridgel MD, Sharlot Gowda.  INR POC: 1.9.  Cuvette Lot#: [id number].  Exp: 08/2010.    Anticoagulation Management Assessment/Plan:      The patient's current anticoagulation dose is Coumadin 5 mg tabs: Take as directed by coumadin clinic..  The target INR is 2 - 3.  The next INR is due 06/20/2009.  Anticoagulation instructions were given to patient.  Results were reviewed/authorized by Charolotte Eke, PharmD.  She was notified by Charolotte Eke, PharmD.         Prior Anticoagulation Instructions: INR 1.1   Take 2 tablets today and tomorrow then resume same dose of 1 tablet every day except 1 1/2 tablets on Wednesday   Current Anticoagulation Instructions: Take one and a half tablets today only then resume previous: 1 tab daily except one and a half tablets every Wed.

## 2010-02-20 NOTE — Medication Information (Signed)
Summary: Veronica Powers      Allergies Added:  Anticoagulant Therapy  Managed by: Minerva Areola, RN Referring MD: Sherryl Manges MD Supervising MD: Juanda Chance MD, Debra Calabretta Indication 1: Atrial Fibrillation (ICD-427.31) Lab Used: [facility] Site: [facility] INR POC 2.2 INR RANGE 2 - 3  Dietary changes: no    Health status changes: no    Bleeding/hemorrhagic complications: no    Recent/future hospitalizations: no    Any changes in medication regimen? no    Recent/future dental: no  Any missed doses?: no       Is patient compliant with meds? yes       Current Medications (verified): 1)  Coumadin 5 Mg Tabs (Warfarin Sodium) .... Take As Directed By [facility]madin Clinic. 2)  Brimonidine Tartrate 0.15 % Soln (Brimonidine Tartrate) .... Both Eyes Twice Daily 3)  Pilocarpine Hcl 4 % Soln (Pilocarpine Hcl) .... One Drop Both Eyes 4 Times A Day 4)  Azopt 1 % Susp (Brinzolamide) .... One Drop Both Eyes Twice Daily 5)  Alphagan P 0.15 % Soln (Brimonidine Tartrate) .... One Drop Both Eyes Twice Daily 6)  Refresh Plus 0.5 % Soln (Carboxymethylcellulose Sodium) .... One Drop Both Eyes As Needed 7)  Lumigan 0.03 % Soln (Bimatoprost) .... One Drop Both Eyes At Night 8)  Oxybutynin Chloride 10 Mg Xr24h-Tab (Oxybutynin Chloride) .... Take One By Mouth Once Daily As Needed 9)  Allopurinol 100 Mg Tabs (Allopurinol) .Marland Kitchen.. 1 Tab By Mouth Once Daily 10)  Guanfacine Hcl 2 Mg Tabs (Guanfacine Hcl) .... Take 1 Tab By Mouth At Bedtime 11)  Lipitor 20 Mg Tabs (Atorvastatin Calcium) .... Take One Tablet By Mouth Daily. 12)  Indomethacin 25 Mg Caps (Indomethacin) .... Take 1 Tab By Mouth Once Daily As Needed For Gout 13)  Lasix 40 Mg Tabs (Furosemide) .Marland Kitchen.. 1 Tab By Mouth Once Daily 14)  Amlodipine Besy-Benazepril Hcl 10-20 Mg Caps (Amlodipine Besy-Benazepril Hcl) .... Take One Capsule By Mouth Daily 15)  Hydralazine Hcl 10 Mg Tabs (Hydralazine Hcl) .... Take One Tablet Two Times A Day  Allergies (verified): 1)   ! Procardia  Anticoagulation Management History:      The patient is taking warfarin and comes in today for a routine follow up visit.  Positive risk factors for bleeding include an age of 41 years or older.  The bleeding index is 'intermediate risk'.  Positive CHADS2 values include Age > 37 years old.  The start date was 07/11/2007.  Her last INR was 6.1 RATIO.  Anticoagulation responsible provider: Juanda Chance MD, Smitty Cords.  INR POC: 2.2.  Cuvette Lot#: [id number].  Exp: 05/2010.    Anticoagulation Management Assessment/Plan:      The patient's current anticoagulation dose is Coumadin 5 mg tabs: Take as directed by coumadin clinic..  The target INR is 2 - 3.  The next INR is due 04/04/2009.  Anticoagulation instructions were given to patient.  Results were reviewed/authorized by Minerva Areola, RN.  She was notified by Minerva Areola, RN, BSN.         Prior Anticoagulation Instructions: INR 1.5 Today 1 1/2 tablets then change dose to 1 tablet everyday except 1 1/2 tablets on Wednesdays. Recheck in 10 days.   Current Anticoagulation Instructions: The patient is to continue with the same dose of coumadin.  This dosage includes: 1 tablet (5mg ) every day except 1 and a half tablet (7.5mg ) on Wednesdays.

## 2010-02-20 NOTE — Medication Information (Signed)
Summary: rov-tp  Anticoagulant Therapy  Managed by: Bethena Midget, RN, BSN Referring MD: Sherryl Manges MD Supervising MD: Excell Seltzer MD, Casimiro Needle Indication 1: Atrial Fibrillation (ICD-427.31) Lab Used: [facility] Site: [facility] INR POC 1.1 INR RANGE 2 - 3  Dietary changes: no    Health status changes: no    Bleeding/hemorrhagic complications: no    Recent/future hospitalizations: no    Any changes in medication regimen? no    Recent/future dental: no  Any missed doses?: no       Is patient compliant with meds? yes       Allergies: 1)  ! Procardia  Anticoagulation Management History:      The patient is taking warfarin and comes in today for a routine follow up visit.  Positive risk factors for bleeding include an age of 20 years or older.  The bleeding index is 'intermediate risk'.  Positive CHADS2 values include Age > 79 years old.  The start date was 07/11/2007.  Her last INR was 6.1 RATIO.  Anticoagulation responsible provider: Excell Seltzer MD, Casimiro Needle.  INR POC: 1.1.  Cuvette Lot#: [id number].  Exp: 08/2010.    Anticoagulation Management Assessment/Plan:      The patient's current anticoagulation dose is Coumadin 5 mg tabs: Take as directed by coumadin clinic..  The target INR is 2 - 3.  The next INR is due 06/28/2009.  Anticoagulation instructions were given to patient.  Results were reviewed/authorized by Bethena Midget, RN, BSN.  She was notified by Bethena Midget, RN, BSN.         Prior Anticoagulation Instructions: Take one and a half tablets today only then resume previous: 1 tab daily except one and a half tablets every Wed.  Current Anticoagulation Instructions: INR 1.1 Today take 1 1/2 pills  then change dose to 1 pill everyday except 1 1/2 pills on Tuesdays, Thursdays, and Saturdays. Recheck in one week.

## 2010-02-20 NOTE — Assessment & Plan Note (Signed)
Summary: per check out/sf   Primary Provider:  Chestine Spore  CC:  ROV; Chronic SOB.  History of Present Illness:    Veronica Powers is seen in followup for bradycardia. She recently underwent Holter monitoring following discontinuation of her carvedilol. It however demonstrated sinus rhythm at 32 and a peak heart rate of 80. Her echo was reviewed demonstrating normal left ventricular function and mild aortic stenosis;  she also has biatrial enlargement  Not withstanifding this however, the patient has no complaints of exercise intolerance or shortness of breath. I should note that she walks with a cane very very slowly.   She was recently hospitalized for diarrhea and nausea. Review of the laboratories from the hospitalization included an ECG demonstrating rapid atrial fibrillation; other laboratories were relatively normal. A TSH was not obtained   Echo cardiogram most recently was 2010 demonstrating mild aortic stenosis and normal left ventricular function    Problems Prior to Update: 1)  Hypertension, Heart Controlled w/o Assoc CHF  (ICD-402.10) 2)  Atrial Fibrillation  (ICD-427.31) 3)  Bradycardia  (ICD-427.89) 4)  Aortic Stenosis  (ICD-424.1)  Medications Prior to Update: 1)  Coumadin 5 Mg Tabs (Warfarin Sodium) .... Take As Directed By Coumadin Clinic. 2)  Brimonidine Tartrate 0.15 % Soln (Brimonidine Tartrate) .... Both Eyes Twice Daily 3)  Pilocarpine Hcl 4 % Soln (Pilocarpine Hcl) .... One Drop Both Eyes 4 Times A Day 4)  Azopt 1 % Susp (Brinzolamide) .... One Drop Both Eyes Twice Daily 5)  Alphagan P 0.15 % Soln (Brimonidine Tartrate) .... One Drop Both Eyes Twice Daily 6)  Refresh Plus 0.5 % Soln (Carboxymethylcellulose Sodium) .... One Drop Both Eyes As Needed 7)  Lumigan 0.03 % Soln (Bimatoprost) .... One Drop Both Eyes At Night 8)  Oxybutynin Chloride 10 Mg Xr24h-Tab (Oxybutynin Chloride) .... Take One By Mouth Once Daily As Needed 9)  Allopurinol 100 Mg Tabs (Allopurinol) .Marland Kitchen..  1 Tab By Mouth Once Daily 10)  Guanfacine Hcl 2 Mg Tabs (Guanfacine Hcl) .... Take 1 Tab By Mouth At Bedtime 11)  Lipitor 20 Mg Tabs (Atorvastatin Calcium) .... Take One Tablet By Mouth Daily. 12)  Indomethacin 25 Mg Caps (Indomethacin) .... Take 1 Tab By Mouth Once Daily As Needed For Gout 13)  Lasix 40 Mg Tabs (Furosemide) .Marland Kitchen.. 1 Tab By Mouth Once Daily 14)  Amlodipine Besy-Benazepril Hcl 10-20 Mg Caps (Amlodipine Besy-Benazepril Hcl) .... Take One Capsule By Mouth Daily 15)  Hydralazine Hcl 10 Mg Tabs (Hydralazine Hcl) .... Take One Tablet Two Times A Day  Current Medications (verified): 1)  Coumadin 5 Mg Tabs (Warfarin Sodium) .... Take As Directed By Coumadin Clinic. 2)  Brimonidine Tartrate 0.15 % Soln (Brimonidine Tartrate) .... Both Eyes Twice Daily 3)  Pilocarpine Hcl 4 % Soln (Pilocarpine Hcl) .... One Drop Both Eyes 4 Times A Day 4)  Azopt 1 % Susp (Brinzolamide) .... One Drop Both Eyes Twice Daily 5)  Alphagan P 0.15 % Soln (Brimonidine Tartrate) .... One Drop Both Eyes Twice Daily 6)  Refresh Plus 0.5 % Soln (Carboxymethylcellulose Sodium) .... One Drop Both Eyes As Needed 7)  Lumigan 0.03 % Soln (Bimatoprost) .... One Drop Both Eyes At Night 8)  Oxybutynin Chloride 10 Mg Xr24h-Tab (Oxybutynin Chloride) .... Take One By Mouth Once Daily As Needed 9)  Allopurinol 100 Mg Tabs (Allopurinol) .Marland Kitchen.. 1 Tab By Mouth Once Daily 10)  Guanfacine Hcl 2 Mg Tabs (Guanfacine Hcl) .... Take 1 Tab By Mouth At Bedtime 11)  Lipitor 20 Mg Tabs (  Atorvastatin Calcium) .... Take One Tablet By Mouth Daily. 12)  Indomethacin 25 Mg Caps (Indomethacin) .... Take 1 Tab By Mouth Once Daily As Needed For Gout 13)  Lasix 40 Mg Tabs (Furosemide) .Marland Kitchen.. 1 Tab By Mouth Once Daily 14)  Amlodipine Besy-Benazepril Hcl 10-20 Mg Caps (Amlodipine Besy-Benazepril Hcl) .... Take One Capsule By Mouth Daily 15)  Hydralazine Hcl 10 Mg Tabs (Hydralazine Hcl) .... Take One Tablet Two Times A Day  Allergies: 1)  !  Procardia  Past History:  Past Medical History: Last updated: 11/29/2008 Atrial fibrillation - permanent Controlled ventricular response Prior stroke Coumadin for the above Aortic stenosis - mild to moderate type 2 diabetes mellitus  Vital Signs:  Patient profile:   77 year old female Height:      58 inches Weight:      165 pounds BMI:     34.61 Pulse rate:   104 / minute Pulse rhythm:   irregular BP sitting:   200 / 80  (right arm)  Vitals Entered By: Stanton Kidney, EMT-P (December 18, 2009 11:43 AM)  Physical Exam  General:  The patient was alert and oriented in no acute distress. HEENT Normal.  Neck veins were flat, carotids were brisk.  Lungs were clear.  Heart sounds were regular without murmurs or gallops.  Abdomen was soft with active bowel sounds. There is no clubbing cyanosis or edema. Skin Warm and dry left arm is edematous and Largely non-functioning   EKG  Procedure date:  12/18/2009  Findings:      atrial fibrillation is present at a rate of 34 Intervals-/2106.46 Axis is leftward at -80  Impression & Recommendations:  Problem # 1:  ATRIAL FIBRILLATION (ICD-427.31) lesion is atrial fibrillation with a rapid rate. She was in atrial fibrillation couple weeks ago; I enjoyed to presume that this is persistent. For right now we will add metoprolol 25 mg twice daily. When she sees Dr. Chestine Spore in 2 weeks, and she is still going rapidly, her dose could be increased slightly. Based on the Race-2 data, a heart rate in the 80s and 04V would certainly be acceptable to me in atrial fibrillation.  In the event that she reverts to sinus rhythm, however, we will end up with bradycardia and she will almost certainly need to undergo pacemaker implantation for per day she has bradycardia  Problem # 2:  HYPERTENSION, HEART CONTROLLED W/O ASSOC CHF (ICD-402.10) her blood pressure is markedly elevated today. Yesterday at home was in the 1:30 range. She did not take her  diuretic today and she finds that she is very diuretic sensitive in terms of her blood pressure. She will be followed up with Dr. Chestine Spore in 2 weeks time. I will defer to his expertise management of her blood pressure.  Patient Instructions: 1)  Your physician has recommended you make the following change in your medication: Metoprolol 50mg  twice a day 2)  Your physician wants you to follow-up in:  6 months.  You will receive a reminder letter in the mail two months in advance. If you don't receive a letter, please call our office to schedule the follow-up appointment. Prescriptions: METOPROLOL TARTRATE 50 MG TABS (METOPROLOL TARTRATE) two times a day  #180 x 6   Entered by:   Claris Gladden RN   Authorized by:   Nathen May, MD, [facility]   Signed by:   Claris Gladden RN on 12/18/2009   Method used:   Electronically to  [facility]  [address] [phone] (retail)       [address] [place], [place]  [address]       Ph: [phone] or [phone]       Fax: [phone]   RxID:   [id number]

## 2010-02-20 NOTE — Medication Information (Signed)
Summary: cvrr  rov   js  Anticoagulant Therapy  Managed by: [name], PharmD, BCPS, CPP Referring MD: Sherryl Manges MD Supervising MD: Ladona Ridgel MD, Sharlot Gowda Indication 1: Atrial Fibrillation (ICD-427.31) Lab Used: [facility] Site: [facility] INR POC 1.8 INR RANGE 2 - 3  Dietary changes: no    Health status changes: no    Bleeding/hemorrhagic complications: no    Recent/future hospitalizations: no    Any changes in medication regimen? no    Recent/future dental: no  Any missed doses?: no       Is patient compliant with meds? yes       Current Medications (verified): 1)  Coumadin 5 Mg Tabs (Warfarin Sodium) .... Take As Directed By Coumadin Clinic. 2)  Brimonidine Tartrate 0.15 % Soln (Brimonidine Tartrate) .... Both Eyes Twice Daily 3)  Pilocarpine Hcl 4 % Soln (Pilocarpine Hcl) .... One Drop Both Eyes 4 Times A Day 4)  Azopt 1 % Susp (Brinzolamide) .... One Drop Both Eyes Twice Daily 5)  Alphagan P 0.15 % Soln (Brimonidine Tartrate) .... One Drop Both Eyes Twice Daily 6)  Refresh Plus 0.5 % Soln (Carboxymethylcellulose Sodium) .... One Drop Both Eyes As Needed 7)  Lumigan 0.03 % Soln (Bimatoprost) .... One Drop Both Eyes At Night 8)  Oxybutynin Chloride 10 Mg Xr24h-Tab (Oxybutynin Chloride) .... Take One By Mouth Once Daily As Needed 9)  Allopurinol 100 Mg Tabs (Allopurinol) .Marland Kitchen.. 1 Tab By Mouth Once Daily 10)  Guanfacine Hcl 2 Mg Tabs (Guanfacine Hcl) .... Take 1 Tab By Mouth At Bedtime 11)  Lipitor 20 Mg Tabs (Atorvastatin Calcium) .... Take One Tablet By Mouth Daily. 12)  Indomethacin 25 Mg Caps (Indomethacin) .... Take 1 Tab By Mouth Once Daily As Needed For Gout 13)  Lasix 40 Mg Tabs (Furosemide) .Marland Kitchen.. 1 Tab By Mouth Once Daily 14)  Amlodipine Besy-Benazepril Hcl 10-20 Mg Caps (Amlodipine Besy-Benazepril Hcl) .... Take One Capsule By Mouth Daily 15)  Hydralazine Hcl 10 Mg Tabs (Hydralazine Hcl) .... Take One Tablet Two Times A Day  Allergies: 1)  !  Procardia  Anticoagulation Management History:      The patient is taking warfarin and comes in today for a routine follow up visit.  Positive risk factors for bleeding include an age of 37 years or older.  The bleeding index is 'intermediate risk'.  Positive CHADS2 values include Age > 78 years old.  The start date was 07/11/2007.  Her last INR was 6.1 RATIO.  Anticoagulation responsible provider: Ladona Ridgel MD, Sharlot Gowda.  INR POC: 1.8.  Cuvette Lot#: [id number].  Exp: 05/2010.    Anticoagulation Management Assessment/Plan:      The patient's current anticoagulation dose is Coumadin 5 mg tabs: Take as directed by coumadin clinic..  The target INR is 2 - 3.  The next INR is due 05/02/2009.  Anticoagulation instructions were given to patient.  Results were reviewed/authorized by [name], PharmD, BCPS, CPP.         Prior Anticoagulation Instructions: The patient is to continue with the same dose of coumadin.  This dosage includes: 1 tablet (5mg ) every day except 1 and a half tablet (7.5mg ) on Wednesdays.  Current Anticoagulation Instructions: INR 1.8  Coumadin 1 and 1/2 tab = 7.5mg  today Thur 3/17  Then 1 and 1/2 tab on Wed and 1 tab = 5mg  all other days

## 2010-02-20 NOTE — Medication Information (Signed)
Summary: rov/tm  Anticoagulant Therapy  Managed by: Bethena Midget, RN, BSN Referring MD: Sherryl Manges MD Supervising MD: Clifton James MD, Cristal Deer Indication 1: Atrial Fibrillation (ICD-427.31) Lab Used: [facility] Site: [facility] INR POC 2.7 INR RANGE 2 - 3  Dietary changes: no    Health status changes: no    Bleeding/hemorrhagic complications: no    Recent/future hospitalizations: no    Any changes in medication regimen? no    Recent/future dental: no  Any missed doses?: no       Is patient compliant with meds? yes       Allergies: 1)  ! Procardia  Anticoagulation Management History:      Her anticoagulation is being managed by telephone today.  Positive risk factors for bleeding include an age of 75 years or older.  The bleeding index is 'intermediate risk'.  Positive CHADS2 values include Age > 55 years old.  The start date was 07/11/2007.  Her last INR was 6.1 RATIO.  Anticoagulation responsible provider: Clifton James MD, Cristal Deer.  INR POC: 2.7.  Cuvette Lot#: [id number].  Exp: 08/2010.    Anticoagulation Management Assessment/Plan:      The patient's current anticoagulation dose is Coumadin 5 mg tabs: Take as directed by coumadin clinic..  The target INR is 2 - 3.  The next INR is due 07/05/2009.  Anticoagulation instructions were given to patient.  Results were reviewed/authorized by Bethena Midget, RN, BSN.  She was notified by Bethena Midget, RN, BSN.         Prior Anticoagulation Instructions: INR 1.1 Today take 1 1/2 pills  then change dose to 1 pill everyday except 1 1/2 pills on Tuesdays, Thursdays, and Saturdays. Recheck in one week.   Current Anticoagulation Instructions: INR 2.7 Continue 1 pill everyday except 1 1/2 pills on Tuesdays, Thursdays and Saturdays. Recheck in one week.

## 2010-02-20 NOTE — Medication Information (Signed)
Summary: Veronica Powers      Allergies Added:  Anticoagulant Therapy  Managed by: [name], PharmD, BCPS, CPP Referring MD: Sherryl Manges MD Supervising MD: Gala Romney MD, Reuel Boom Indication 1: Atrial Fibrillation (ICD-427.31) Lab Used: [facility] Site: [facility] INR POC 2.5 INR RANGE 2 - 3  Dietary changes: no    Health status changes: no    Bleeding/hemorrhagic complications: no    Recent/future hospitalizations: no    Any changes in medication regimen? no    Recent/future dental: no  Any missed doses?: no       Is patient compliant with meds? yes       Current Medications (verified): 1)  Coumadin 5 Mg Tabs (Warfarin Sodium) .... Take As Directed By Coumadin Clinic. 2)  Brimonidine Tartrate 0.15 % Soln (Brimonidine Tartrate) .... Both Eyes Twice Daily 3)  Pilocarpine Hcl 4 % Soln (Pilocarpine Hcl) .... One Drop Both Eyes 4 Times A Day 4)  Azopt 1 % Susp (Brinzolamide) .... One Drop Both Eyes Twice Daily 5)  Alphagan P 0.15 % Soln (Brimonidine Tartrate) .... One Drop Both Eyes Twice Daily 6)  Refresh Plus 0.5 % Soln (Carboxymethylcellulose Sodium) .... One Drop Both Eyes As Needed 7)  Lumigan 0.03 % Soln (Bimatoprost) .... One Drop Both Eyes At Night 8)  Oxybutynin Chloride 10 Mg Xr24h-Tab (Oxybutynin Chloride) .... Take One By Mouth Once Daily As Needed 9)  Allopurinol 100 Mg Tabs (Allopurinol) .Marland Kitchen.. 1 Tab By Mouth Once Daily 10)  Guanfacine Hcl 2 Mg Tabs (Guanfacine Hcl) .... Take 1 Tab By Mouth At Bedtime 11)  Lipitor 20 Mg Tabs (Atorvastatin Calcium) .... Take One Tablet By Mouth Daily. 12)  Indomethacin 25 Mg Caps (Indomethacin) .... Take 1 Tab By Mouth Once Daily As Needed For Gout 13)  Lasix 40 Mg Tabs (Furosemide) .Marland Kitchen.. 1 Tab By Mouth Once Daily 14)  Amlodipine Besy-Benazepril Hcl 10-20 Mg Caps (Amlodipine Besy-Benazepril Hcl) .... Take One Capsule By Mouth Daily 15)  Hydralazine Hcl 10 Mg Tabs (Hydralazine Hcl) .... Take One Tablet Two Times A Day  Allergies  (verified): 1)  ! Procardia  Anticoagulation Management History:      The patient is taking warfarin and comes in today for a routine follow up visit.  Positive risk factors for bleeding include an age of 44 years or older.  The bleeding index is 'intermediate risk'.  Positive CHADS2 values include Age > 47 years old.  The start date was 07/11/2007.  Her last INR was 6.1 RATIO.  Anticoagulation responsible provider: Karo Rog MD, Reuel Boom.  INR POC: 2.5.  Cuvette Lot#: [id number].  Exp: 05/2010.    Anticoagulation Management Assessment/Plan:      The patient's current anticoagulation dose is Coumadin 5 mg tabs: Take as directed by coumadin clinic..  The target INR is 2 - 3.  The next INR is due 05/30/2009.  Anticoagulation instructions were given to patient.  Results were reviewed/authorized by [name], PharmD, BCPS, CPP.  She was notified by n terbeck.         Prior Anticoagulation Instructions: INR 1.8  Coumadin 1 and 1/2 tab = 7.5mg  today Thur 3/17  Then 1 and 1/2 tab on Wed and 1 tab = 5mg  all other days   Current Anticoagulation Instructions: INR=2.5  will continue on present dosing schedule--(5mg ) 1 tablet on sun,mon,tues,thur,fri sat--1 and 1/2 tablets on wednesday

## 2010-02-20 NOTE — Medication Information (Signed)
Summary: rov/ez  Anticoagulant Therapy  Managed by: Bethena Midget, RN, BSN Referring MD: Sherryl Manges MD Supervising MD: Riley Kill MD, Maisie Fus Indication 1: Atrial Fibrillation (ICD-427.31) Lab Used: [facility] Site: [facility] INR POC 1.8 INR RANGE 2 - 3  Dietary changes: no    Health status changes: yes       Details: Frequent urination. will f/u with PCP  Bleeding/hemorrhagic complications: no    Recent/future hospitalizations: no    Any changes in medication regimen? no    Recent/future dental: no  Any missed doses?: no       Is patient compliant with meds? yes       Allergies: 1)  ! Procardia  Anticoagulation Management History:      The patient is taking warfarin and comes in today for a routine follow up visit.  Positive risk factors for bleeding include an age of 75 years or older.  The bleeding index is 'intermediate risk'.  Positive CHADS2 values include Age > 75 years old.  The start date was 07/11/2007.  Her last INR was 6.1 RATIO.  Anticoagulation responsible provider: Riley Kill MD, Maisie Fus.  INR POC: 1.8.  Cuvette Lot#: [id number].  Exp: 04/2010.    Anticoagulation Management Assessment/Plan:      The patient's current anticoagulation dose is Coumadin 5 mg tabs: Take as directed by coumadin clinic..  The target INR is 2 - 3.  The next INR is due 03/12/2009.  Anticoagulation instructions were given to patient.  Results were reviewed/authorized by Bethena Midget, RN, BSN.  She was notified by Bethena Midget, RN, BSN.         Prior Anticoagulation Instructions: INR 1.4  Take 1 tablets today then increase dose to 1 tablet daily except 0.5 tablet on Wednesdays.  Recheck in 10 days.   Current Anticoagulation Instructions: INR 1.8 Today take 1 1/2 tablet then change dose to 1 tablet everyday. Recheck in 2 weeks.

## 2010-02-20 NOTE — Medication Information (Signed)
Summary: rov/tm  Anticoagulant Therapy  Managed by: Cloyde Reams, RN, BSN Referring MD: Sherryl Manges MD Supervising MD: Riley Kill MD, Maisie Fus Indication 1: Atrial Fibrillation (ICD-427.31) Lab Used: [facility] Site: [facility] INR POC 2.2 INR RANGE 2 - 3  Dietary changes: no    Health status changes: no    Bleeding/hemorrhagic complications: no    Recent/future hospitalizations: no    Any changes in medication regimen? no    Recent/future dental: no  Any missed doses?: no       Is patient compliant with meds? yes       Allergies: 1)  ! Procardia  Anticoagulation Management History:      The patient is taking warfarin and comes in today for a routine follow up visit.  Positive risk factors for bleeding include an age of 75 years or older.  The bleeding index is 'intermediate risk'.  Positive CHADS2 values include Age > 71 years old.  The start date was 07/11/2007.  Her last INR was 6.1 RATIO.  Anticoagulation responsible Laketia Vicknair: Riley Kill MD, Maisie Fus.  INR POC: 2.2.  Cuvette Lot#: [id number].  Exp: 10/2010.    Anticoagulation Management Assessment/Plan:      The patient's current anticoagulation dose is Coumadin 5 mg tabs: Take as directed by coumadin clinic..  The target INR is 2 - 3.  The next INR is due 10/14/2009.  Anticoagulation instructions were given to patient.  Results were reviewed/authorized by Cloyde Reams, RN, BSN.  She was notified by Cloyde Reams RN.         Prior Anticoagulation Instructions: INR 1.9 Today take 1.5 pills then resume 1 pill everyday except 1.5 pills on Tuesdays, Thursdays and Saturdays. Recheck in 3 weeks.   Current Anticoagulation Instructions: INR 2.2  Continue on same dosage 1 tablet daily except 1.5 tablets on Tuesdays, Thursdays, and Saturdays.  Recheck in 4 weeks.

## 2010-02-20 NOTE — Medication Information (Signed)
Summary: rov/tm  Anticoagulant Therapy  Managed by: Bethena Midget, RN, BSN Referring MD: Sherryl Manges MD PCP: Lubertha Sayres MD: Eden Emms MD, Theron Arista Indication 1: Atrial Fibrillation (ICD-427.31) Lab Used: [facility] Site: [facility] INR POC 2.6 INR RANGE 2 - 3  Dietary changes: no    Health status changes: no    Bleeding/hemorrhagic complications: no    Recent/future hospitalizations: no    Any changes in medication regimen? no    Recent/future dental: no  Any missed doses?: no       Is patient compliant with meds? yes       Allergies: 1)  ! Procardia  Anticoagulation Management History:      The patient is taking warfarin and comes in today for a routine follow up visit.  Positive risk factors for bleeding include an age of 75 years or older.  The bleeding index is 'intermediate risk'.  Positive CHADS2 values include Age > 44 years old.  The start date was 07/11/2007.  Her last INR was 6.1 RATIO.  Anticoagulation responsible provider: Eden Emms MD, Theron Arista.  INR POC: 2.6.  Cuvette Lot#: [id number].  Exp: 02/2011.    Anticoagulation Management Assessment/Plan:      The patient's current anticoagulation dose is Coumadin 5 mg tabs: Take as directed by coumadin clinic..  The target INR is 2 - 3.  The next INR is due 02/27/2010.  Anticoagulation instructions were given to patient.  Results were reviewed/authorized by Bethena Midget, RN, BSN.  She was notified by Linward Headland, PharmD candidate.         Prior Anticoagulation Instructions: INR 2.7 Continue 1 pill everyday except 1.5 pills on Mondays, Wednesdays and Fridays. Recheck in 4 weeks.   Current Anticoagulation Instructions: INR 2.6 (goal INR: 2-3)  Take 1 tablet everyday except 1 and 1/2 tablets on Mondays, Wednesdays, and Fridays.  Recheck in 4 weeks.

## 2010-02-20 NOTE — Medication Information (Signed)
Summary: rov/tm   Anticoagulant Therapy  Managed by: Weston Brass, PharmD Referring MD: Sherryl Manges MD Supervising MD: Ladona Ridgel MD, Sharlot Gowda Indication 1: Atrial Fibrillation (ICD-427.31) Lab Used: [facility] Site: [facility] INR POC 1.7 INR RANGE 2 - 3  Dietary changes: no    Health status changes: no    Bleeding/hemorrhagic complications: no    Recent/future hospitalizations: no    Any changes in medication regimen? no    Recent/future dental: no  Any missed doses?: no       Is patient compliant with meds? yes       Allergies: 1)  ! Procardia  Anticoagulation Management History:      The patient is taking warfarin and comes in today for a routine follow up visit.  Positive risk factors for bleeding include an age of 75 years or older.  The bleeding index is 'intermediate risk'.  Positive CHADS2 values include Age > 75 years old.  The start date was 07/11/2007.  Her last INR was 6.1 RATIO.  Anticoagulation responsible provider: Ladona Ridgel MD, Sharlot Gowda.  INR POC: 1.7.  Cuvette Lot#: [id number].  Exp: 12/2010.    Anticoagulation Management Assessment/Plan:      The patient's current anticoagulation dose is Coumadin 5 mg tabs: Take as directed by coumadin clinic..  The target INR is 2 - 3.  The next INR is due 12/18/2009.  Anticoagulation instructions were given to patient.  Results were reviewed/authorized by Weston Brass, PharmD.  She was notified by Weston Brass PharmD.         Prior Anticoagulation Instructions: INR 1.3 Today take 1.5 pills then change dose to 1 pill everyday except 1.5 pills on Mondays and Fridays. Recheck in 10 days.   Current Anticoagulation Instructions: INR 1.7  Increase dose to 1 tablet every day except 1 1/2 tablets on Monday, Wednesday and Friday.

## 2010-02-20 NOTE — Medication Information (Signed)
Summary: [facility]in Clinic  Anticoagulant Therapy  Managed by: Eda Keys, PharmD Referring MD: Sherryl Manges MD Supervising MD: Antoine Poche MD, Fayrene Fearing Indication 1: Atrial Fibrillation (ICD-427.31) Lab Used: [facility] Site: [facility] PT 26.3 INR POC 2.2 INR RANGE 2 - 3  Dietary changes: no    Health status changes: no    Bleeding/hemorrhagic complications: no    Recent/future hospitalizations: no    Any changes in medication regimen? no    Recent/future dental: no  Any missed doses?: no       Is patient compliant with meds? yes       Allergies: 1)  ! Procardia  Anticoagulation Management History:      Her anticoagulation is being managed by telephone today.  Positive risk factors for bleeding include an age of 75 years or older.  The bleeding index is 'intermediate risk'.  Positive CHADS2 values include Age > 54 years old.  The start date was 07/11/2007.  Her last INR was 6.1 RATIO.  Prothrombin time is 26.3.  Anticoagulation responsible provider: Antoine Poche MD, Fayrene Fearing.  INR POC: 2.2.  Exp: 12/2010.    Anticoagulation Management Assessment/Plan:      The patient's current anticoagulation dose is Coumadin 5 mg tabs: Take as directed by coumadin clinic..  The target INR is 2 - 3.  The next INR is due 12/13/2009.  Anticoagulation instructions were given to patient.  Results were reviewed/authorized by Eda Keys, PharmD.  She was notified by Eda Keys.         Prior Anticoagulation Instructions: INR 1.7  Increase dose to 1 tablet every day except 1 1/2 tablets on Monday, Wednesday and Friday.    Current Anticoagulation Instructions: INR 2.2  Continue current dosing schedule of 1.5 tablets on Monday, Wednesday, and Friday and 1 tablet all other days.  Recheck in 1 week.  Orders given to [facility] with [facility], she was in the home and relayed instructions to patient.

## 2010-02-20 NOTE — Medication Information (Signed)
Summary: rov/sp  Anticoagulant Therapy  Managed by: Weston Brass, PharmD Referring MD: Sherryl Manges MD Supervising MD: Antoine Poche MD, Fayrene Fearing Indication 1: Atrial Fibrillation (ICD-427.31) Lab Used: [facility] Site: [facility] INR POC 3.0 INR RANGE 2 - 3  Dietary changes: no    Health status changes: no    Bleeding/hemorrhagic complications: no    Recent/future hospitalizations: no    Any changes in medication regimen? no    Recent/future dental: no  Any missed doses?: no       Is patient compliant with meds? yes       Allergies: 1)  ! Procardia  Anticoagulation Management History:      The patient is taking warfarin and comes in today for a routine follow up visit.  Positive risk factors for bleeding include an age of 14 years or older.  The bleeding index is 'intermediate risk'.  Positive CHADS2 values include Age > 26 years old.  The start date was 07/11/2007.  Her last INR was 6.1 RATIO.  Anticoagulation responsible provider: Antoine Poche MD, Fayrene Fearing.  INR POC: 3.0.  Cuvette Lot#: [id number].  Exp: 09/2010.    Anticoagulation Management Assessment/Plan:      The patient's current anticoagulation dose is Coumadin 5 mg tabs: Take as directed by coumadin clinic..  The target INR is 2 - 3.  The next INR is due 08/23/2009.  Anticoagulation instructions were given to patient.  Results were reviewed/authorized by Weston Brass, PharmD.  She was notified by Weston Brass PharmD.         Prior Anticoagulation Instructions: INR 2.5  Continue same dose of 1 tablet every day except 1 1/2 tablets on Tuesday, Thursday, and Saturday.   Current Anticoagulation Instructions: INR 3.0  Continue same dose of 1 tablet every day except 1 1/2 tablets on Tuesday, Thursday and Saturday.  Eat an extra serving of greens today.

## 2010-02-27 DIAGNOSIS — I4891 Unspecified atrial fibrillation: Secondary | ICD-10-CM

## 2010-03-06 ENCOUNTER — Encounter: Payer: Self-pay | Admitting: Internal Medicine

## 2010-03-06 ENCOUNTER — Encounter (INDEPENDENT_AMBULATORY_CARE_PROVIDER_SITE_OTHER): Payer: PRIVATE HEALTH INSURANCE

## 2010-03-06 DIAGNOSIS — I4891 Unspecified atrial fibrillation: Secondary | ICD-10-CM

## 2010-03-06 DIAGNOSIS — Z7901 Long term (current) use of anticoagulants: Secondary | ICD-10-CM

## 2010-03-12 NOTE — Medication Information (Signed)
Summary: rov/sp  Anticoagulant Therapy  Managed by: Bethena Midget, RN, BSN Referring MD: Sherryl Manges MD PCP: Lubertha Sayres MD: Tenny Craw MD, Gunnar Fusi Indication 1: Atrial Fibrillation (ICD-427.31) Lab Used: [facility] Site: [facility] INR POC 1.1 INR RANGE 2 - 3  Dietary changes: no    Health status changes: yes       Details: Having Diarrhea all night last night   Bleeding/hemorrhagic complications: no    Recent/future hospitalizations: no    Any changes in medication regimen? no    Recent/future dental: no  Any missed doses?: no       Is patient compliant with meds? yes       Allergies: 1)  ! Procardia  Anticoagulation Management History:      The patient is taking warfarin and comes in today for a routine follow up visit.  Positive risk factors for bleeding include an age of 21 years or older.  The bleeding index is 'intermediate risk'.  Positive CHADS2 values include Age > 47 years old.  The start date was 07/11/2007.  Her last INR was 6.1 RATIO.  Anticoagulation responsible provider: Tenny Craw MD, Gunnar Fusi.  INR POC: 1.1.  Cuvette Lot#: [id number].  Exp: 01/2011.    Anticoagulation Management Assessment/Plan:      The patient's current anticoagulation dose is Coumadin 5 mg tabs: Take as directed by coumadin clinic..  The target INR is 2 - 3.  The next INR is due 03/13/2010.  Anticoagulation instructions were given to patient.  Results were reviewed/authorized by Bethena Midget, RN, BSN.  She was notified by Bethena Midget, RN, BSN.         Prior Anticoagulation Instructions: INR 2.6 (goal INR: 2-3)  Take 1 tablet everyday except 1 and 1/2 tablets on Mondays, Wednesdays, and Fridays.  Recheck in 4 weeks.    Current Anticoagulation Instructions: INR 1.1 Today take 1.5 pills, on Friday take 2 pills then resume 1 pill everyday except 1.5 pills on Mondays, Wednesdays and Fridays. Recheck in one week.

## 2010-03-13 ENCOUNTER — Encounter: Payer: Self-pay | Admitting: Internal Medicine

## 2010-03-13 ENCOUNTER — Encounter (INDEPENDENT_AMBULATORY_CARE_PROVIDER_SITE_OTHER): Payer: PRIVATE HEALTH INSURANCE

## 2010-03-13 DIAGNOSIS — I4891 Unspecified atrial fibrillation: Secondary | ICD-10-CM

## 2010-03-13 DIAGNOSIS — Z7901 Long term (current) use of anticoagulants: Secondary | ICD-10-CM

## 2010-03-16 ENCOUNTER — Emergency Department (HOSPITAL_COMMUNITY)
Admission: EM | Admit: 2010-03-16 | Discharge: 2010-03-16 | Disposition: A | Discharge status: Home / Self Care | Payer: PRIVATE HEALTH INSURANCE | Attending: Emergency Medicine | Admitting: Emergency Medicine

## 2010-03-16 ENCOUNTER — Emergency Department (HOSPITAL_COMMUNITY): Payer: PRIVATE HEALTH INSURANCE

## 2010-03-16 DIAGNOSIS — IMO0002 Reserved for concepts with insufficient information to code with codable children: Secondary | ICD-10-CM | POA: Insufficient documentation

## 2010-03-16 DIAGNOSIS — I4891 Unspecified atrial fibrillation: Secondary | ICD-10-CM | POA: Insufficient documentation

## 2010-03-16 DIAGNOSIS — Z7901 Long term (current) use of anticoagulants: Secondary | ICD-10-CM | POA: Insufficient documentation

## 2010-03-16 DIAGNOSIS — S0990XA Unspecified injury of head, initial encounter: Secondary | ICD-10-CM | POA: Insufficient documentation

## 2010-03-16 DIAGNOSIS — S0003XA Contusion of scalp, initial encounter: Secondary | ICD-10-CM | POA: Insufficient documentation

## 2010-03-16 DIAGNOSIS — Y92009 Unspecified place in unspecified non-institutional (private) residence as the place of occurrence of the external cause: Secondary | ICD-10-CM | POA: Insufficient documentation

## 2010-03-16 DIAGNOSIS — I10 Essential (primary) hypertension: Secondary | ICD-10-CM | POA: Insufficient documentation

## 2010-03-16 DIAGNOSIS — M25529 Pain in unspecified elbow: Secondary | ICD-10-CM | POA: Insufficient documentation

## 2010-03-16 DIAGNOSIS — Z8679 Personal history of other diseases of the circulatory system: Secondary | ICD-10-CM | POA: Insufficient documentation

## 2010-03-16 DIAGNOSIS — W19XXXA Unspecified fall, initial encounter: Secondary | ICD-10-CM | POA: Insufficient documentation

## 2010-03-16 DIAGNOSIS — S1093XA Contusion of unspecified part of neck, initial encounter: Secondary | ICD-10-CM | POA: Insufficient documentation

## 2010-03-16 DIAGNOSIS — G319 Degenerative disease of nervous system, unspecified: Secondary | ICD-10-CM | POA: Insufficient documentation

## 2010-03-18 NOTE — Medication Information (Signed)
Summary: rov/tm  Anticoagulant Therapy  Managed by: Windell Hummingbird, RN Referring MD: Sherryl Manges MD PCP: Lubertha Sayres MD: Ladona Ridgel MD, Sharlot Gowda Indication 1: Atrial Fibrillation (ICD-427.31) Lab Used: [facility] Site: [facility] INR POC 3.2 INR RANGE 2 - 3  Dietary changes: no    Health status changes: no    Bleeding/hemorrhagic complications: no    Recent/future hospitalizations: no    Any changes in medication regimen? no    Recent/future dental: no  Any missed doses?: no       Is patient compliant with meds? yes       Allergies: 1)  ! Procardia  Anticoagulation Management History:      The patient is taking warfarin and comes in today for a routine follow up visit.  Positive risk factors for bleeding include an age of 47 years or older.  The bleeding index is 'intermediate risk'.  Positive CHADS2 values include Age > 43 years old.  The start date was 07/11/2007.  Her last INR was 6.1 RATIO.  Anticoagulation responsible provider: Ladona Ridgel MD, Sharlot Gowda.  INR POC: 3.2.  Cuvette Lot#: [id number].  Exp: 01/2011.    Anticoagulation Management Assessment/Plan:      The patient's current anticoagulation dose is Coumadin 5 mg tabs: Take as directed by coumadin clinic..  The target INR is 2 - 3.  The next INR is due 03/27/2010.  Anticoagulation instructions were given to patient.  Results were reviewed/authorized by Windell Hummingbird, RN.  She was notified by Windell Hummingbird, RN.         Prior Anticoagulation Instructions: INR 1.1 Today take 1.5 pills, on Friday take 2 pills then resume 1 pill everyday except 1.5 pills on Mondays, Wednesdays and Fridays. Recheck in one week.   Current Anticoagulation Instructions: INR 3.2 Take 1/2 tablet today.  Then resume taking 1 tablet every day, except take 1 1/2 tablets on Mondays, Wednesdays, and Fridays. Recheck in 2 weeks.

## 2010-03-27 ENCOUNTER — Encounter (INDEPENDENT_AMBULATORY_CARE_PROVIDER_SITE_OTHER): Payer: PRIVATE HEALTH INSURANCE

## 2010-03-27 ENCOUNTER — Encounter: Payer: Self-pay | Admitting: Internal Medicine

## 2010-03-27 DIAGNOSIS — Z7901 Long term (current) use of anticoagulants: Secondary | ICD-10-CM

## 2010-03-27 DIAGNOSIS — I4891 Unspecified atrial fibrillation: Secondary | ICD-10-CM

## 2010-04-01 LAB — DIFFERENTIAL
Basophils Absolute: 0 10*3/uL (ref 0.0–0.1)
Eosinophils Relative: 0 % (ref 0–5)
Lymphocytes Relative: 8 % — ABNORMAL LOW (ref 12–46)
Lymphs Abs: 0.6 10*3/uL — ABNORMAL LOW (ref 0.7–4.0)
Monocytes Absolute: 0.2 10*3/uL (ref 0.1–1.0)
Neutro Abs: 6.7 10*3/uL (ref 1.7–7.7)

## 2010-04-01 LAB — URINE MICROSCOPIC-ADD ON

## 2010-04-01 LAB — URINALYSIS, ROUTINE W REFLEX MICROSCOPIC
Glucose, UA: NEGATIVE mg/dL
Leukocytes, UA: NEGATIVE
Protein, ur: >300 mg/dL — AB
Specific Gravity, Urine: 1.024 (ref 1.005–1.030)
pH: 5.5 (ref 5.0–8.0)

## 2010-04-01 LAB — COMPREHENSIVE METABOLIC PANEL
ALT: 24 U/L (ref 0–35)
AST: 28 U/L (ref 0–37)
AST: 38 U/L — ABNORMAL HIGH (ref 0–37)
Albumin: 3.1 g/dL — ABNORMAL LOW (ref 3.5–5.2)
Albumin: 3.7 g/dL (ref 3.5–5.2)
Alkaline Phosphatase: 58 U/L (ref 39–117)
BUN: 13 mg/dL (ref 6–23)
Calcium: 9 mg/dL (ref 8.4–10.5)
Chloride: 109 mEq/L (ref 96–112)
Creatinine, Ser: 1.14 mg/dL (ref 0.4–1.2)
GFR calc Af Amer: 55 mL/min — ABNORMAL LOW (ref >60)
Potassium: 3.4 mEq/L — ABNORMAL LOW (ref 3.5–5.1)
Sodium: 138 mEq/L (ref 135–145)
Total Bilirubin: 1 mg/dL (ref 0.3–1.2)
Total Bilirubin: 1.3 mg/dL — ABNORMAL HIGH (ref 0.3–1.2)
Total Protein: 7.2 g/dL (ref 6.0–8.3)
Total Protein: 8.5 g/dL — ABNORMAL HIGH (ref 6.0–8.3)

## 2010-04-01 LAB — CLOSTRIDIUM DIFFICILE EIA: C difficile Toxins A+B, EIA: NEGATIVE

## 2010-04-01 LAB — CBC
MCH: 30 pg (ref 26.0–34.0)
MCHC: 33.9 g/dL (ref 30.0–36.0)
MCV: 88.6 fL (ref 78.0–100.0)
Platelets: 258 10*3/uL (ref 150–400)
Platelets: 301 10*3/uL (ref 150–400)
RBC: 3.71 MIL/uL — ABNORMAL LOW (ref 3.87–5.11)
RBC: 4.09 MIL/uL (ref 3.87–5.11)
RDW: 16.2 % — ABNORMAL HIGH (ref 11.5–15.5)
RDW: 16.6 % — ABNORMAL HIGH (ref 11.5–15.5)
WBC: 4.2 10*3/uL (ref 4.0–10.5)

## 2010-04-01 LAB — OVA AND PARASITE EXAMINATION

## 2010-04-01 LAB — PROTIME-INR: Prothrombin Time: 15.4 seconds — ABNORMAL HIGH (ref 11.6–15.2)

## 2010-04-01 LAB — URINE CULTURE

## 2010-04-01 LAB — STOOL CULTURE

## 2010-04-01 NOTE — Medication Information (Signed)
Summary: rov/pc  Anticoagulant Therapy  Managed by: Cloyde Reams, RN, BSN Referring MD: Sherryl Manges MD PCP: Lubertha Sayres MD: Ladona Ridgel MD, Sharlot Gowda Indication 1: Atrial Fibrillation (ICD-427.31) Lab Used: [facility] Site: [facility] INR POC 3.6 INR RANGE 2 - 3  Dietary changes: no    Health status changes: yes       Details: Pt fell Feb 26, went to ED checked out ok nothing broken, just bruised.   Bleeding/hemorrhagic complications: no    Recent/future hospitalizations: no    Any changes in medication regimen? no    Recent/future dental: no  Any missed doses?: no       Is patient compliant with meds? yes       Allergies: 1)  ! Procardia  Anticoagulation Management History:      The patient is taking warfarin and comes in today for a routine follow up visit.  Positive risk factors for bleeding include an age of 75 years or older.  The bleeding index is 'intermediate risk'.  Positive CHADS2 values include Age > 75 years old.  The start date was 07/11/2007.  Her last INR was 6.1 RATIO.  Anticoagulation responsible provider: Ladona Ridgel MD, Sharlot Gowda.  INR POC: 3.6.  Cuvette Lot#: [id number].  Exp: 03/2011.    Anticoagulation Management Assessment/Plan:      The patient's current anticoagulation dose is Coumadin 5 mg tabs: Take as directed by coumadin clinic..  The target INR is 2 - 3.  The next INR is due 04/10/2010.  Anticoagulation instructions were given to patient.  Results were reviewed/authorized by Cloyde Reams, RN, BSN.  She was notified by Cloyde Reams RN.         Prior Anticoagulation Instructions: INR 3.2 Take 1/2 tablet today.  Then resume taking 1 tablet every day, except take 1 1/2 tablets on Mondays, Wednesdays, and Fridays. Recheck in 2 weeks.  Current Anticoagulation Instructions: INR 3.6  Skip today's dosage of Coumadin.  Then start taking 1 tablet daily except 1.5 tablets on Mondays and Fridays.  Recheck in 2 weeks.

## 2010-04-10 ENCOUNTER — Ambulatory Visit (INDEPENDENT_AMBULATORY_CARE_PROVIDER_SITE_OTHER): Payer: PRIVATE HEALTH INSURANCE | Admitting: *Deleted

## 2010-04-10 DIAGNOSIS — Z7901 Long term (current) use of anticoagulants: Secondary | ICD-10-CM | POA: Insufficient documentation

## 2010-04-10 DIAGNOSIS — I4891 Unspecified atrial fibrillation: Secondary | ICD-10-CM

## 2010-04-10 DIAGNOSIS — I10 Essential (primary) hypertension: Secondary | ICD-10-CM

## 2010-04-10 DIAGNOSIS — I639 Cerebral infarction, unspecified: Secondary | ICD-10-CM

## 2010-04-10 DIAGNOSIS — I6350 Cerebral infarction due to unspecified occlusion or stenosis of unspecified cerebral artery: Secondary | ICD-10-CM

## 2010-04-10 DIAGNOSIS — I2510 Atherosclerotic heart disease of native coronary artery without angina pectoris: Secondary | ICD-10-CM

## 2010-04-10 LAB — CBC WITH DIFFERENTIAL/PLATELET
Basophils Relative: 0.7 % (ref 0.0–3.0)
Eosinophils Absolute: 0.2 10*3/uL (ref 0.0–0.7)
Eosinophils Relative: 4 % (ref 0.0–5.0)
Lymphocytes Relative: 35.6 % (ref 12.0–46.0)
MCHC: 33.5 g/dL (ref 30.0–36.0)
Monocytes Relative: 6.5 % (ref 3.0–12.0)
Neutrophils Relative %: 53.2 % (ref 43.0–77.0)
RBC: 3.74 Mil/uL — ABNORMAL LOW (ref 3.87–5.11)
WBC: 6 10*3/uL (ref 4.5–10.5)

## 2010-04-10 LAB — COMPREHENSIVE METABOLIC PANEL
AST: 21 U/L (ref 0–37)
Albumin: 4 g/dL (ref 3.5–5.2)
BUN: 20 mg/dL (ref 6–23)
CO2: 27 mEq/L (ref 19–32)
Calcium: 9.2 mg/dL (ref 8.4–10.5)
Chloride: 102 mEq/L (ref 96–112)
Potassium: 4.4 mEq/L (ref 3.5–5.1)

## 2010-04-10 LAB — PROTIME-INR
INR: 8.9 ratio (ref 0.8–1.0)
Prothrombin Time: 79.2 s (ref 10.2–12.4)

## 2010-04-10 LAB — URIC ACID: Uric Acid, Serum: 6.7 mg/dL (ref 2.4–7.0)

## 2010-04-10 LAB — POCT INR: INR: 6

## 2010-04-10 NOTE — Patient Instructions (Addendum)
Dr Graciela Husbands spoke with pt on 04/10/10 and instructed pt to hold Coumadin x 4 days and recheck in clinic on 04/14/10.  Instructed pt if any bleeding occurs go to ED.  Pt agrees.

## 2010-04-11 ENCOUNTER — Encounter: Payer: PRIVATE HEALTH INSURANCE | Admitting: *Deleted

## 2010-04-14 ENCOUNTER — Ambulatory Visit (INDEPENDENT_AMBULATORY_CARE_PROVIDER_SITE_OTHER): Payer: PRIVATE HEALTH INSURANCE | Admitting: *Deleted

## 2010-04-14 DIAGNOSIS — I4891 Unspecified atrial fibrillation: Secondary | ICD-10-CM

## 2010-04-14 NOTE — Patient Instructions (Signed)
Start taking 1 tablet daily except 1.5 tablets on Mondays.  Recheck in 1 week.

## 2010-04-22 ENCOUNTER — Ambulatory Visit (INDEPENDENT_AMBULATORY_CARE_PROVIDER_SITE_OTHER): Payer: PRIVATE HEALTH INSURANCE | Admitting: *Deleted

## 2010-04-22 DIAGNOSIS — Z7901 Long term (current) use of anticoagulants: Secondary | ICD-10-CM

## 2010-04-22 DIAGNOSIS — I4891 Unspecified atrial fibrillation: Secondary | ICD-10-CM

## 2010-04-22 NOTE — Patient Instructions (Addendum)
INR 1.1 Today  take 10 mg. Change your Friday dose to 1.5 tablets. Take 1.5 tablets on mondays and fridays. And 1 tablet all other days. Return to clinic next week.

## 2010-05-01 ENCOUNTER — Ambulatory Visit (INDEPENDENT_AMBULATORY_CARE_PROVIDER_SITE_OTHER): Payer: PRIVATE HEALTH INSURANCE | Admitting: *Deleted

## 2010-05-01 DIAGNOSIS — I4891 Unspecified atrial fibrillation: Secondary | ICD-10-CM

## 2010-05-15 ENCOUNTER — Ambulatory Visit (INDEPENDENT_AMBULATORY_CARE_PROVIDER_SITE_OTHER): Payer: PRIVATE HEALTH INSURANCE | Admitting: *Deleted

## 2010-05-15 DIAGNOSIS — I4891 Unspecified atrial fibrillation: Secondary | ICD-10-CM

## 2010-05-15 LAB — POCT INR: INR: 1.5

## 2010-05-17 ENCOUNTER — Other Ambulatory Visit: Payer: Self-pay | Admitting: Internal Medicine

## 2010-05-29 ENCOUNTER — Ambulatory Visit (INDEPENDENT_AMBULATORY_CARE_PROVIDER_SITE_OTHER): Payer: PRIVATE HEALTH INSURANCE | Admitting: *Deleted

## 2010-05-29 DIAGNOSIS — I4891 Unspecified atrial fibrillation: Secondary | ICD-10-CM

## 2010-05-29 LAB — POCT INR: INR: 3.6

## 2010-06-03 ENCOUNTER — Inpatient Hospital Stay (HOSPITAL_COMMUNITY)
Admission: EM | Admit: 2010-06-03 | Discharge: 2010-06-06 | DRG: 292 | Disposition: A | Discharge status: Home Health | Payer: PRIVATE HEALTH INSURANCE | Attending: Internal Medicine | Admitting: Internal Medicine

## 2010-06-03 ENCOUNTER — Emergency Department (HOSPITAL_COMMUNITY): Payer: PRIVATE HEALTH INSURANCE

## 2010-06-03 DIAGNOSIS — I359 Nonrheumatic aortic valve disorder, unspecified: Secondary | ICD-10-CM

## 2010-06-03 DIAGNOSIS — I2789 Other specified pulmonary heart diseases: Secondary | ICD-10-CM | POA: Diagnosis present

## 2010-06-03 DIAGNOSIS — I509 Heart failure, unspecified: Secondary | ICD-10-CM | POA: Diagnosis present

## 2010-06-03 DIAGNOSIS — E7800 Pure hypercholesterolemia, unspecified: Secondary | ICD-10-CM | POA: Diagnosis present

## 2010-06-03 DIAGNOSIS — N183 Chronic kidney disease, stage 3 unspecified: Secondary | ICD-10-CM | POA: Diagnosis present

## 2010-06-03 DIAGNOSIS — I4891 Unspecified atrial fibrillation: Secondary | ICD-10-CM | POA: Diagnosis present

## 2010-06-03 DIAGNOSIS — E876 Hypokalemia: Secondary | ICD-10-CM | POA: Diagnosis present

## 2010-06-03 DIAGNOSIS — Z7901 Long term (current) use of anticoagulants: Secondary | ICD-10-CM

## 2010-06-03 DIAGNOSIS — I498 Other specified cardiac arrhythmias: Secondary | ICD-10-CM | POA: Diagnosis present

## 2010-06-03 DIAGNOSIS — I129 Hypertensive chronic kidney disease with stage 1 through stage 4 chronic kidney disease, or unspecified chronic kidney disease: Secondary | ICD-10-CM | POA: Diagnosis present

## 2010-06-03 DIAGNOSIS — I5033 Acute on chronic diastolic (congestive) heart failure: Principal | ICD-10-CM | POA: Diagnosis present

## 2010-06-03 DIAGNOSIS — I69959 Hemiplegia and hemiparesis following unspecified cerebrovascular disease affecting unspecified side: Secondary | ICD-10-CM

## 2010-06-03 LAB — URINALYSIS, ROUTINE W REFLEX MICROSCOPIC
Glucose, UA: NEGATIVE mg/dL
Leukocytes, UA: NEGATIVE
pH: 6 (ref 5.0–8.0)

## 2010-06-03 LAB — COMPREHENSIVE METABOLIC PANEL
BUN: 31 mg/dL — ABNORMAL HIGH (ref 6–23)
Calcium: 9.1 mg/dL (ref 8.4–10.5)
Glucose, Bld: 156 mg/dL — ABNORMAL HIGH (ref 70–99)
Sodium: 138 mEq/L (ref 135–145)
Total Protein: 8.5 g/dL — ABNORMAL HIGH (ref 6.0–8.3)

## 2010-06-03 LAB — DIFFERENTIAL
Basophils Relative: 0 % (ref 0–1)
Eosinophils Absolute: 0.2 10*3/uL (ref 0.0–0.7)
Monocytes Absolute: 0.4 10*3/uL (ref 0.1–1.0)
Neutro Abs: 6.4 10*3/uL (ref 1.7–7.7)

## 2010-06-03 LAB — PROTIME-INR
INR: 5.74 (ref 0.00–1.49)
Prothrombin Time: 51.4 seconds — ABNORMAL HIGH (ref 11.6–15.2)

## 2010-06-03 LAB — POCT CARDIAC MARKERS
CKMB, poc: 2.3 ng/mL (ref 1.0–8.0)
Myoglobin, poc: 129 ng/mL (ref 12–200)

## 2010-06-03 LAB — URINE MICROSCOPIC-ADD ON

## 2010-06-03 LAB — CBC
HCT: 30.8 % — ABNORMAL LOW (ref 36.0–46.0)
MCHC: 33.1 g/dL (ref 30.0–36.0)
MCV: 87 fL (ref 78.0–100.0)
RDW: 15 % (ref 11.5–15.5)

## 2010-06-03 LAB — TSH: TSH: 2.366 u[IU]/mL (ref 0.350–4.500)

## 2010-06-03 LAB — T4, FREE: Free T4: 1.27 ng/dL (ref 0.80–1.80)

## 2010-06-03 NOTE — Letter (Signed)
June 30, 2007    Veronica Powers, M.D.  [address]  [place], [place]  [address]   RE:  Veronica, Powers  MRN:  [id number]  /  DOB:  November 10, 1925   Dear Veronica Powers,   Veronica Powers came in today to see me about her slow heart rate.  Thank  you very much for the privilege.   As you know, she is an 77 year old lady who has suffered a significant  left body stroke for whom bradycardia has been an issue in the past but  in whom you noted a very slow heart rate when she saw you last week.  Your notes had suggested that she was to discontinue her labetalol and  increase her Lotrel.  Unfortunately, she did not appreciate that  recommendation and she came in today on the same medications that she  was on last week.  Given that I have asked her to stop the labetalol, we  have increased her Lotrel from 5/10 to 10/20 b.i.d.  I should note that  her other medications include Lipitor 20, furosemide 40, aspirin 81,  allopurinol and a bunch of eye drops for her glaucoma including  pilocarpine, brinzolamide, brimonidine and bimatoprost.    On examination her blood pressure was 180/92, unfortunately, with a  small cuff.  When we took it with a large cuff,  (when the nurse took  it) it was 150/62 with a pulse of 48.  Her lungs were clear.  Her heart sounds were regular but there was a harsh mid to late peaking  3/6 systolic murmur that was accompanied with delayed carotid pulses  both parvus and tardus.  She has trace peripheral edema but significant edema in her left  immobile arm.   I did not repeat the electrocardiogram today.  Reviewing yours from last  week, I could add nothing to your interpretation that had demonstrated a  junctional rhythm at 39 with intervals of 0.10/0.42.  There were no  discernible P-waves except potentially something described in the  midportion of the ST-segment.   IMPRESSION:  1. Largely asymptomatic junctional rhythm.  2. Severe hypertension.  3. A  harsh aortic outflow murmur, question aortic stenosis.  4. Prior stroke.   I asked Veronica Powers if she would not mind coming back in about 10 days  following the medication adjustments that you had recommended before.  We will plan to get a 2D echo at that time.  I will fill in the picture  hopefully a good deal more when we see her again.   Thanks for the consultation.    Sincerely,      Veronica Salvia, MD, [facility]  Electronically Signed    SCK/MedQ  DD: 06/30/2007  DT: 06/30/2007  Job #: [id number]

## 2010-06-03 NOTE — Letter (Signed)
July 13, 2007    Veronica Bayley, MD  [address], [address]  [place], [place] [address]   RE:  Veronica Powers  MRN:  [id number]  /  DOB:  Dec 13, 1925   Dear Veronica Powers,   Veronica Powers came back in today to follow up with Veronica echo.  As you  recall, we had talked about Veronica junctional rhythm and Veronica aortic outflow  murmur.  It turns out that she probably has some aortic sclerosis.  However, she is found to be in atrial fibrillation today.  She is  unaware of it.   Thromboembolic risk factors are notable for age, hypertension, (gender),  and prior stroke, although it is remote.  I presume that it was probably  related to hypertension.   MEDICATION LIST:  1. Lipitor 20.  2. Furosemide 40.  3. Aspirin 81.  4. Oxybutynin 10.  5. Allopurinol.  6. Amlodipine.  7. Benazepril 10/20 in the morning and 5/10 at night.  8. Labetalol 300 twice a day.  9. A variety of eye drops.   PHYSICAL EXAMINATION:  VITAL SIGNS:  Veronica blood pressure was still  elevated at 177/81, Veronica pulse was 75.  LUNGS:  Clear.  HEART:  Sounds were regular with variable murmur.  EXTREMITIES :  Edema 2+ on the left, 1+ edema on the right.   Electrocardiogram demonstrated atrial fibrillation with a rate of 75.   Veronica pulmonary echo demonstrates an aortic valve area of 1.3 consistent  with mild aortic stenosis.   IMPRESSION:  1. Atrial fibrillation - asymptomatic.  2. Thromboembolic risk factors notable for:      a.     Age.      b.     Hypertension.      c.     Prior stroke.      d.     ? Gender.  3. Aortic stenosis - mild.   Veronica Powers, as we talked about today, Veronica Powers has atrial fibrillation  and with a high [place] score of 4, she should be on Coumadin.  I have  reviewed this with Veronica and Veronica Powers including the timing of Veronica  old stroke, the benefits, risks versus aspirin, and they are agreeable.  I appreciate your insight that she was not having new problem with  falls.  We will begin Veronica on  Coumadin at 2.5 mg a day.  We will have Veronica  follow up with the Coumadin Clinic.  I do not think that I will plan to  cardiovert Veronica given Veronica propensity of bradycardia, and we will just let  Veronica stay in atrial fibrillation, I think, given Veronica paucity of symptoms.   I will see Veronica again in 3 months' time.  The patient will be followed at  the [facility] closely.    Sincerely,      Duke Salvia, MD, [facility]  Electronically Signed    SCK/MedQ  DD: 07/13/2007  DT: 07/14/2007  Job #: [id number]

## 2010-06-03 NOTE — Letter (Signed)
July 13, 2007    Veronica Powers, M.D.  [address], [address]  [place]  [place] [address]   RE:  Veronica Powers, Veronica Powers  MRN:  [id number]  /  DOB:  19-Apr-1925   Dear Fraser Din,   [name] comes in today following her echo.  Somewhat surprisingly,  the echo did not show much, but some aortic sclerosis.  Her heartbeat  was much better today and unfortunately demonstrates that she is in  atrial fibrillation with a controlled ventricular response.  In the  light of her prior stroke, her age, and hypertension, her CHADS score is  now 4 and translates to annualized risk of between 8 and 10%.  Unfortunately, the value of aspirin has been repeatedly demonstrated to  be inadequate and she probably needs to be on Coumadin in the absence of  a strong contraindication.   On examination today, her blood pressure was 177/81 with a pulse of 75.  Her lungs were clear.  Her heart sounds were irregular, variably intense  murmur.  The extremities had 1+ edema on the left and trace edema on the  right.  Her left arm was relatively flaccid.   Electrocardiogram dated today demonstrated atrial fibrillation at 52  with intervals of -/0.11/0.42.  The axis was leftward, -66.   IMPRESSION:  1. Atrial fibrillation - new onset.  2. Thromboembolic risk factors, notable for:      a.     Age.      b.     Hypertension.      c.     Prior stroke.  3. Normal left ventricular function with aortic sclerosis, murmur, and      probably mild aortic stenosis.   Fraser Din, I think [name] needs to be started on Coumadin as  mentioned.  I will plan to talk to you this afternoon when we get back  as to getting that initiated.  Recent data talked about    INCOMPLETE DICTATION    Sincerely,      Duke Salvia, MD, [facility]    SCK/MedQ  DD: 07/13/2007  DT: 07/14/2007  Job #: [id number]

## 2010-06-03 NOTE — Assessment & Plan Note (Signed)
[facility]                         ELECTROPHYSIOLOGY OFFICE NOTE   Veronica Powers, Veronica Powers                        MRN:          [id number]  DATE:03/28/2008                            DOB:          [birth year]    Ms. Veronica Powers is seen in followup for atrial fibrillation.  She has a  prior stroke and she takes Coumadin.  Her biggest complaint is that she  is having some problems with her right arm feeling like it is not strong  enough to do what she needs to do above her head.  She denies aching in  her proximal muscles.   Her medications currently include Lotrel 10/20, Lipitor, furosemide,  pilocarpine, and warfarin.   PHYSICAL EXAMINATION:  VITAL SIGNS:  Blood pressure today was 161/94,  her pulse was 106 and then down to 75.  LUNGS:  Clear.  HEART:  Heart sounds were irregular with a mid to late peaking 2/6  murmur.  NECK:  The carotids were mildly delayed.  EXTREMITIES:  No edema.  She had good strength with her wrist extension,  elbow flexion and extension, and shoulder abduction.   Electrocardiogram today demonstrated sinus rhythm at 79 with intervals  of -0.11/0.39, the axis was leftward -60.   IMPRESSION:  1. Atrial fibrillation - permanent.  2. Controlled ventricular response.  3. Prior stroke.  4. Coumadin for the above.  5. Complaints of right arm weakness without evidence of objective      weakness.  6. Aortic stenosis - mild to moderate.   Ms. Veronica Powers is stable from the arrhythmia point of view.  Her concern  with her right arm prompts me to think about polymyalgia rheumatica.  She was reluctant to have any blood drawn today.  Dr. Chestine Spore drew it a  couple of weeks ago and she will be following up with him shortly.  I  will ask her to follow up with him regarding the possibility of PMR.   Ms. Veronica Powers is quite adamant about not wanting surgery.  We will follow  her aortic stenosis clinically.  There will be no need to follow up with  an  echo.     Duke Salvia, MD, [facility]  Electronically Signed   SCK/MedQ  DD: 03/28/2008  DT: 03/29/2008  Job #: [id number]   cc:   [name], M.D.

## 2010-06-03 NOTE — Assessment & Plan Note (Signed)
[facility]                         ELECTROPHYSIOLOGY OFFICE NOTE   [name], [name]                        MRN:          [id number]  DATE:09/28/2007                            DOB:          Dec 27, 1925    [name] is seen in followup for atrial fibrillation.  She also has  hypertension and anemia and she is feeling pretty good at this point and  denies chest pain or shortness of breath.  There is a little bit of  peripheral edema.   Her medication list includes amlodipine and benazepril 10/20 in the a.m.  and 5/10 in the p.m., Lipitor 20, furosemide 40 daily, allopurinol 100,  a variety of eye drops, she is also taking oxybutynin to reverse urinary  retention.   On examination, her blood pressure is 160/90, her pulse is 76.  The  lungs are clear.  Heart sounds are regular.  There is edema in the left  arm.  The extremities have trace edema.   IMPRESSION:  1. Atrial fibrillation with controlled ventricular response.  2. Anemia.  3. Hypertension - with evidence of white coat syndrome.  Blood      pressures at home are 120-130, here in the office 160/90.  4. We will plan to check [name]'s CBC today on Coumadin to see if      her hemoglobin is stable.  It has previously been about 10.   We will also check a BMET to make sure there is no untoward effects of  her antihypertensives on her renal function.  We will see her again in 6  months' time.     Duke Salvia, MD, [facility]  Electronically Signed    SCK/MedQ  DD: 09/28/2007  DT: 09/29/2007  Job #: [id number]   cc:   [name], M.D.

## 2010-06-03 NOTE — H&P (Signed)
Veronica Powers, Veronica Powers               ACCOUNT NO.:  [id number]  MEDICAL RECORD NO.:  [id number]           PATIENT TYPE:  E  LOCATION:  [facility]                         FACILITY:  [facility]  PHYSICIAN:  Rock Nephew, MD       DATE OF BIRTH:  01/06/1926  DATE OF ADMISSION:  06/03/2010 DATE OF DISCHARGE:                             HISTORY & PHYSICAL   PRIMARY CARE PHYSICIAN:  [name], MD  CHIEF COMPLAINT:  Shortness of breath, CHF exacerbation, acute.  HISTORY OF PRESENT ILLNESS:  This is an 77 year old female with a history of asthma; hypertension; aortic stenosis, moderate, last echocardiogram in the computer is October 2010 with valve area of 1.12 cm, peak gradient of 20 mmHg; history of AFib, on Coumadin; history of CVA; left-sided hemiparesis; history of chronic kidney disease stage III; history of gout; and hypercholesterolemia.  The patient reports that she was in her usual state of health until this morning, she woke up with being very short of breath.  She denied any chest pain.  She denies any fevers or any chills.  She does reports she is coughing up whitish sputum.  She denied any orthopnea, PND, or lower extremity edema.  She came to the hospital.  EKG showed a normal sinus rhythm. Initial blood pressure was 144/51 with a pulse rate of 69.  The patient's chest x-ray showed cardiomegaly with mild edema consistent with congestive heart failure.  Pro-BNP was mildly elevated at 843.1.  Also, INR was 5.73, creatinine was 1.37, baseline is about 1.1-1.2.  The patient reports shortness of breath, however, she feels better since coming to the ED She received 60mg  IV lasix in the ED.  She has been placed on a BiPAP.  Again, she denies any chest pain.  We are asked to admit this patient for shortness of breath and congestive heart failure, acute CHF with preserved ejection fraction, probably acute diastolic CHF.  PAST MEDICAL HISTORY: 1. Asthma. 2. Hypertension. 3. Aortic  stenosis. 4. AFib, on Coumadin. 5. History of CVA. 6. Left-sided hemiparesis. 7. History of chronic kidney disease stage III. 8. History of gout. 9. History of hypercholesterolemia.  SOCIAL HISTORY:  Nonsmoker, nondrinker.  No drug abuse.  She lives at home with her husband.  She usually uses a cane for ambulation.  ALLERGIES:  PROCARDIA.  FAMILY HISTORY:  Hypertension.  REVIEW OF SYSTEMS:  She denies any headaches, any blurry vision.  She denies any chest pain.  She reports shortness of breath per HPI.  She denies any nausea or vomiting.  She reports coughing up whitish sputum. She denies any abdominal pain.  She reports some occasional constipation, but no diarrhea.  No pain in her legs.  She is alert, awake, and oriented x3 and can conversate even with the BiPAP on.  HOME MEDICATIONS: 1. She takes allopurinol 100 mg a day. 2. Alphagan ophthalmic ointment. 3. She takes amlodipine and benazepril combination pill, 10/20 mg once     a day. 4. She takes atorvastatin 20 mg a day. 5. She takes Azopt. 6. She takes furosemide 40 mg a day. 7. She takes guaifenesin 200 mg  once a day. 8. Hydralazine 10 mg, I believe it is once a day. 9. She also takes indomethacin. 10.She also takes Lumigan ophthalmic ointment. 11.Metoprolol tartrate 50 mg twice a day. 12.Oxybutynin chloride 10 mg once a day. 13.Warfarin 5 mg once a day.  Her Coumadin level is followed up by I believe in [facility] at [facility].  PHYSICAL EXAMINATION:  VITAL SIGNS:  Temperature 97.8.  Blood pressure has ranged from 144-134 systolic, diastolic has ranged from 51-73. Pulse rate has ranged from 66-39.  Respiratory rate has ranged from 32- 18.  97% saturation on BiPAP. HEAD, EYES, EARS, NOSE, AND THROAT:  Normocephalic and atraumatic. Pupils are equally round, and reactive to light. CARDIOVASCULAR:  S1 and S2.  Regular rate and rhythm.  No murmurs.  The patient has mild aortic stenosis, murmur. LUNGS:  Rhonchi  with bilateral air entry. ABDOMEN:  Soft, nontender, and nondistended.  Bowel sounds are positive. No guarding.  No rebound tenderness. EXTREMITIES:  She has no lower extremity edema. NEUROLOGIC:  She is alert, awake, and oriented x3.  Cranial nerves II- XII are grossly intact.  She has some mild weakness on the left side.  The patient's 12-lead EKG shows sinus rhythm, nonspecific intraventricular conduction delay, nonspecific ST-T wave changes.  RADIOLOGICAL STUDIES:  The patient's chest x-ray shows cardiomegaly with mild edema consistent with congestive heart failure.  LABORATORY STUDIES:  WBC count 9.2, hemoglobin 10.2, hematocrit 30.8, MCV is 87.0, platelets of 289, and neutrophils 78.  The patient's last H and H were about 11.2-11.3/33.  INR 5.73.  Sodium 138, potassium 3.6, chloride 105, bicarbonate 22, BUN 31, creatinine 1.37, baseline creatinine is about 1.2, total bili 0.5, AST 45, ALT 32, total protein 8.5, albumin 3.8.  Pro-beta nitrate peptide is 843.1.  Urinalysis: Specific gravity 1.018, negative leukocyte esterase, negative nitrites. Urine culture is pending.  IMPRESSION AND PLAN:  This is an 77 year old female with a history ofmultiple medical problems, admitted for shortness of breath, acute diastolic congestive heart failure exacerbation.  PROBLEM LIST: 1. Shortness of breath and acute diastolic congestive heart failure.     The patient is susceptible to fluid overload, especially in the setting of aortic stenosis.  The patient had a preserved ejection     fraction during the last echocardiogram.  The patient will be     admitted to the hospital.  The patient will be placed on Lasix 40     mg IV t.i.d.  The patient will be continued on oral     antihypertensives.  We will try and titrate off the Bipap.     We will also place p.r.n. metoprolol, p.r.n. enalaprilat, and     p.r.n. hydralazine just in case the patient is on BiPAP and she cannot     take oral  antihypertensives.  We will also get a 2-D echocardiogram     to assess the patient's ejection fraction.  If the patient has     significant changes in the aortic valve or the ejection fraction,     we will obtain the cardiology consultation for further evaluation.     The patient is a DNR so intubation is not an option.  The patient     will also be placed on baby aspirin and we will also cycle the     patient's cardiac enzymes x3. 2. Hypertension.  Again, the patient will be continued on home     antihypertensives and if she cannot take the oral antihypertensives  the patient will be placed on p.r.n. antihypertensives. 3. History of atrial fibrillation.  The patient is on Coumadin.  We     will old the Coumadin.  The patient's INR is supratherapeutic.  We     will give the patient 1 mg oral vitamin K and recheck the PT/INR in     the morning. 4. For aortic stenosis, again we will check a 2-D echocardiogram to     recalculate the patient's EF and valve area and peak gradient.  The     patient is a DNR.  I do not know if the patient is a candidate for     surgical intervention or not if she needs it. 5. Asthma.  The patient has a history of asthma.  The patient will be     continued on albuterol.  Again, she is in normal sinus rhythm. 6. History of cerebrovascular accident with left hemiparesis.  I will     place the patient on baby aspirin also as well as statin.  The     patient appears to be at baseline. 7. Hyperlipidemia.  We will check a fasting lipid profile.  The     patient will be continued on atorvastatin, mild renal insufficiency     on chronic kidney disease stage III.  We will monitor.  It could be     related to CHF.  Hopefully, the kidney function will improve with     some mild Lasix. 8. Deep vein thrombosis prophylaxis.  The patient's has     supratherapeutic INR.  She does not need DVT prophylaxis other than     her that her INR is already being elevated.  CODE  STATUS:  I discussed code status with the patient.  The patient wishes to be a DNR.  The patient has 2 Daughters however unfortunately none are present at this time.     Rock Nephew, MD     NH/MEDQ  D:  06/03/2010  T:  06/03/2010  Job:  [id number]  cc:   [name], M.D. Duke Salvia, MD, [facility]  Electronically Signed by Rock Nephew MD on 06/03/2010 05:36:16 PM

## 2010-06-04 ENCOUNTER — Inpatient Hospital Stay (HOSPITAL_COMMUNITY): Payer: PRIVATE HEALTH INSURANCE

## 2010-06-04 LAB — CARDIAC PANEL(CRET KIN+CKTOT+MB+TROPI)
CK, MB: 3.1 ng/mL (ref 0.3–4.0)
Relative Index: 1.6 (ref 0.0–2.5)
Total CK: 167 U/L (ref 7–177)
Total CK: 146 U/L (ref 7–177)

## 2010-06-04 LAB — URINE CULTURE

## 2010-06-04 LAB — DIFFERENTIAL
Basophils Absolute: 0 10*3/uL (ref 0.0–0.1)
Basophils Relative: 0 % (ref 0–1)
Eosinophils Relative: 2 % (ref 0–5)
Lymphocytes Relative: 27 % (ref 12–46)
Monocytes Absolute: 0.3 10*3/uL (ref 0.1–1.0)

## 2010-06-04 LAB — COMPREHENSIVE METABOLIC PANEL
ALT: 23 U/L (ref 0–35)
AST: 23 U/L (ref 0–37)
Albumin: 3.1 g/dL — ABNORMAL LOW (ref 3.5–5.2)
CO2: 23 mEq/L (ref 19–32)
Calcium: 8.6 mg/dL (ref 8.4–10.5)
GFR calc Af Amer: 46 mL/min — ABNORMAL LOW (ref >60)
Sodium: 138 mEq/L (ref 135–145)
Total Protein: 7.2 g/dL (ref 6.0–8.3)

## 2010-06-04 LAB — PHOSPHORUS: Phosphorus: 2.9 mg/dL (ref 2.3–4.6)

## 2010-06-04 LAB — MAGNESIUM: Magnesium: 1.7 mg/dL (ref 1.5–2.5)

## 2010-06-04 LAB — CBC
HCT: 27 % — ABNORMAL LOW (ref 36.0–46.0)
MCH: 28.4 pg (ref 26.0–34.0)
MCHC: 33 g/dL (ref 30.0–36.0)
RDW: 15 % (ref 11.5–15.5)

## 2010-06-04 LAB — LIPID PANEL
HDL: 46 mg/dL (ref >39)
Total CHOL/HDL Ratio: 1.9 RATIO
Triglycerides: 100 mg/dL (ref <150)
VLDL: 20 mg/dL (ref 0–40)

## 2010-06-04 LAB — PROTIME-INR: INR: 4.72 — ABNORMAL HIGH (ref 0.00–1.49)

## 2010-06-05 LAB — PROTIME-INR
INR: 2.51 — ABNORMAL HIGH (ref 0.00–1.49)
Prothrombin Time: 27.2 seconds — ABNORMAL HIGH (ref 11.6–15.2)

## 2010-06-05 LAB — BASIC METABOLIC PANEL
Calcium: 9.2 mg/dL (ref 8.4–10.5)
GFR calc non Af Amer: 42 mL/min — ABNORMAL LOW (ref >60)
Potassium: 3.7 mEq/L (ref 3.5–5.1)
Sodium: 139 mEq/L (ref 135–145)

## 2010-06-05 LAB — VITAMIN B12: Vitamin B-12: 230 pg/mL (ref 211–911)

## 2010-06-05 LAB — FOLATE: Folate: >20 ng/mL

## 2010-06-05 LAB — FERRITIN: Ferritin: 125 ng/mL (ref 10–291)

## 2010-06-06 LAB — CBC
Hemoglobin: 10.7 g/dL — ABNORMAL LOW (ref 12.0–15.0)
RBC: 3.69 MIL/uL — ABNORMAL LOW (ref 3.87–5.11)
WBC: 6.2 10*3/uL (ref 4.0–10.5)

## 2010-06-06 LAB — BASIC METABOLIC PANEL
CO2: 26 mEq/L (ref 19–32)
Chloride: 100 mEq/L (ref 96–112)
GFR calc Af Amer: 49 mL/min — ABNORMAL LOW (ref >60)
Sodium: 136 mEq/L (ref 135–145)

## 2010-06-06 LAB — PROTIME-INR
INR: 1.8 — ABNORMAL HIGH (ref 0.00–1.49)
Prothrombin Time: 21.1 seconds — ABNORMAL HIGH (ref 11.6–15.2)

## 2010-06-06 NOTE — Consult Note (Signed)
[place]  Patient:    Veronica Powers, Veronica Powers Visit Number: [id number] MRN: [id number]          Service Type: EMS Location: [place] Attending Physician:  Osvaldo Human Dictated by:   Noralyn Pick Eden Emms, M.D. [facility] Admit Date:  07/14/2001   CC:         Lindell Spar. Chestine Spore, M.D.   Consultation Report  HISTORY OF PRESENT ILLNESS:  The patient is a 77 year old patient of Dr. Lindell Spar. Clark.  She has known history of cardiac disease.  She has a longstanding history of murmur.  She had a stroke leading to left upper extremity paralysis 19 years ago which she says was from her high blood pressure.  She came to the minor side of the ER today for a tooth abscess.  Apparently, her heart rate was somewhat slow when she went to see the oral surgeon, however, she is on both Lotrel and Tenex which will slow her heart rate.  Her coronary risk factors include hypertension.  PAST MEDICAL HISTORY:  Her other past medical history is significant for gout, previous CVA, hypertension and cataract laser surgery.  The patient has never had chest pain, syncope, any significant heart failure. She has never had atrial fibrillation or significant heart arrhythmias.  MEDICATIONS: 1. Lotrel 20/10 mg. 2. Avapro. 3. Lasix. 4. Tenex. 5. Allopurinol. 6. Indocin. 7. Prednisone Forte ophthalmic solution.  PHYSICAL EXAMINATION:  GENERAL:  She is overweight.  VITAL SIGNS:  The blood pressure is 160/80.  The pulse is 84 with an occasional PVC.  LUNGS:  Clear.  CARDIAC:  Carotids are normal.  There is an S1 and S2 with a benign systolic ejection murmur of aortic sclerosis.  ABDOMEN:  Benign.  EXTREMITIES:  Lower extremities have intact pulses.  No edema.  Her left upper extremity has some paralysis, particularly in the hand and fingers; this is chronic.  LABORATORY AND ACCESSORY DATA:  Her EKG shows sinus rhythm with left axis deviation, nonspecific ST-T wave changes and  probable LVH.  IMPRESSION:  I think the patient can have oral surgery.  If her heart rate dropped low for the oral surgeon, I suspect all we would need to do is hold her Tenex the night before any procedure; this can also be decreased in dose per Dr. [name].  Since she has significant hypertension and has had a previous stroke and she is 60, I think it would be reasonable to see her as an outpatient to do a baseline echocardiogram to rule out any significant aortic stenosis and also to do a baseline stress test to make sure there is no occult coronary disease.  I explained this in detail to the patients family.  She will be discharged from the minor side of the emergency room per Dr. Chestine Spore with antibiotics for her tooth abscess.  Since she has never had a heart condition and there is no evidence of a previous myocardial infarction on her electrocardiogram and the oral surgery is relatively low risk, I think that if she had to have her abscess fixed, she could do this prior to a stress test or echocardiogram.  Dr. Chestine Spore will handle tapering the dose of the Tenex and I explained to the patient that she should probably not take the Tenex before any procedure if her heart rate was low previously. Dictated by:   Noralyn Pick Eden Emms, M.D. [facility] Attending Physician:  Osvaldo Human DD:  07/14/01 TD:  07/16/01 Job: [id number]

## 2010-06-06 NOTE — Discharge Summary (Signed)
NAMETAYLOR, SPILDE               ACCOUNT NO.:  [id number]   MEDICAL RECORD NO.:  [id number]          PATIENT TYPE:  INP   LOCATION:  5704                         FACILITY:  [facility]   PHYSICIAN:  [name], M.D.    DATE OF BIRTH:  05-12-1925   DATE OF ADMISSION:  06/02/2006  DATE OF DISCHARGE:  06/07/2006                               DISCHARGE SUMMARY   DISCHARGE DIAGNOSES:  1. Herpes zoster of the right eye.  2. Significant systemic hypertension.  3. Hyperlipidemia.  4. Gouty arthropathy.  5. Newly diagnosed type 2 diabetes mellitus.  6. Arteriosclerotic cerebrovascular disease with a previous history of      a right cerebrovascular accident.   REASON FOR ADMISSION:  Mrs. Batley is a 77 year old hypertensive  African American woman who was admitted with progressive swelling and  pain of the right eye.  The patient had been seen by Dr. Elmer Picker for the  above complaints and a diagnosis of herpes zoster involving the fifth  cranial nerve was made.  She was started on Valtrex and bacitracin  ointment after being evaluated in her office the day prior to admission.  On the day of admission, the patient's daughter called the office  stating that she had significantly more pain, swelling and difficulty  seeing out of the right eye and she was brought in to the office where  she was noted to be febrile to 101 and a random blood sugar obtained at  that time was 250 mg percent.  The patient had no prior history of  diabetes although she had had several random blood sugars of 150-160 mg  percent.   PERTINENT PHYSICAL FINDINGS:  GENERAL APPEARANCE:  She is a well-  developed, obese woman appearing quite anxious and with a very inflamed  right periorbital area.  VITAL SIGNS:  Her temperature was 101, blood pressure 148/78, pulse rate  is 84, respiratory rate of 20.  HEENT: There was marked soft tissue swelling of the right orbit with  vesicular lesions and rash of the forehead and the  supraorbital area.  She had full range of motion and she did have grossly good vision, being  able to read newspaper headlines at one foot away.  Her pharynx was  without any swelling, no exudates.  NECK:  Her neck was supple with no adenopathy or thyromegaly.  CHEST:  No splinting, tenderness or deformities.  LUNGS:  Clear.  No wheezes and no focal changes in breath sounds.  CARDIAC:  Precordium is 2+ dynamic.  ABDOMEN:  Obese, soft, nontender, no organomegaly and no masses.  EXTREMITIES:  She had no clubbing, cyanosis and no edema.  No calf  tenderness.  Negative Homans' sign.  NEUROLOGICAL:  She is alert, oriented, cooperative.  She did have a  spastic left hemiparesis.   PERTINENT LABS AND X-RAY DATA:  Her white blood count is 6100 with  hematocrit of 35, hemoglobin of 11.6 and platelet count up to 264,000.  Her chemistry sodium is 130 with a glucose of 120, creatinine 1.52.  She  had normal LFTs and calcium levels.  Her estimated  GFR was 33 mL per  minute.  Total cholesterol 98, LDL cholesterol 42.  TSH is normal at  101.23 and her T3 was 86.6.   EKG:  She had a normal sinus rhythm with a slight rightward deviation of  the lower axis and some nonspecific repolarization abnormalities.   The patient was admitted with working diagnosis of acute herpes zoster  with involvement of a right fifth cranial nerve and probable cellulitis  producing even more soft tissue swelling, systemic hypertension, ASCVD,  hypertensive cardiovascular disease, prior history of a right CVA and a  chronic stable spastic colectomy left hemiparesis, history of glaucoma  and new-onset diabetes, possibly related to prednisone exacerbation of  glucose.   HOSPITAL COURSE:  The patient was cultured both blood as well as the  local wound and empirically started on Unisom 3 grams IV every 12 hours  along with doxycycline to cover the possibility of MRSA, her  antihypertensive medications, Norvasc as well as ARB,  Tenex Lipitor, and  allopurinol were continued and a CT scan of her orbit was requested.  The patient's admitting chest x-ray was unremarkable except for some  cardiomegaly and the presence of surgical clips related to her previous  cholecystectomy, orbital films revealed clear paranasal sinuses and no  acute changes.  A CT scan of the orbits and the temporal bones without  contrast revealed primarily lateral periorbital soft tissue swelling,  felt to be consistent with herpes zoster.  There was a small bit of a  air adjacent to the lateral aspect of the globe but the globe was  otherwise intact.  She had advanced atrophy of the brain related her old  right hemi hemispheric infarct and hemorrhage.   The patient was seen at my request by the infectious disease division.  She was seen by Dr. Cliffton Asters who recommended that her Valtrex of be  continued at 1000 mg three times a day and that her Unasyn and  doxycycline be changed just to Ancef.  With the initial antibiotic  therapy, the patient showed a fairly significant and prompt improvement  both in her temperature elevation as well as reduction in the soft  tissue swelling of her right eye.  She was started on Amaryl 2 mg a day  as maintenance therapy for her diabetes and a standard a fractional  schedule of NovoLog based on her preprandial blood sugars.  The patient  had no other complications or problems during hospitalization with her  fairly prompt response to systemic antibiotics and with reduction and  resolution of her fever, it was felt that the patient could be switched  to oral antibiotics and seen in follow-up by Dr. Elmer Picker with scheduled  return appointment in one week.   DISCHARGE CONDITION:  Much improved.   DISCHARGE DIET:  She is discharged on a 3 grams sodium modified fat  restricted, no concentrated sweets diet.   DISCHARGE MEDICATIONS: 1. Antibiotic therapy, Keflex 500 mg three times a day.  2. Amaryl 2 mg  daily.  3. Allopurinol 100 mg daily.  4. Norvasc 10 mg daily.  5. Tenex 2 mg at bedtime.  6. Zocor 40 mg or Lipitor 20 mg daily.  7. Valtrex 100 mg three times a day.  8. Lasix 40 mg per day.   The patient will be seen by Dr. Elmer Picker in two days and we will see her  back in one week.           ______________________________  [name],  M.D.     PC/MEDQ  D:  07/17/2006  T:  07/18/2006  Job:  [id number]

## 2010-06-12 ENCOUNTER — Ambulatory Visit (INDEPENDENT_AMBULATORY_CARE_PROVIDER_SITE_OTHER): Payer: PRIVATE HEALTH INSURANCE | Admitting: *Deleted

## 2010-06-12 DIAGNOSIS — I4891 Unspecified atrial fibrillation: Secondary | ICD-10-CM

## 2010-06-12 LAB — POCT INR: INR: 1.1

## 2010-06-14 NOTE — Discharge Summary (Signed)
Veronica, Powers               ACCOUNT NO.:  [id number]  MEDICAL RECORD NO.:  [id number]           PATIENT TYPE:  I  LOCATION:  3737                         FACILITY:  [facility]  PHYSICIAN:  Lonia Blood, M.D.       DATE OF BIRTH:  [birth year]  DATE OF ADMISSION:  06/03/2010 DATE OF DISCHARGE:  06/06/2010                              DISCHARGE SUMMARY   PRIMARY CARE PHYSICIAN:  [name], MD  PRIMARY CARDIOLOGIST:  Duke Salvia, MD, [facility]  DISCHARGE DIAGNOSES: 1. Acute on chronic diastolic congestive heart failure exacerbation. 2. Moderate aortic stenosis. 3. Moderate pulmonary hypertension. 4. Severe lifelong hypertension. 5. History of chronic asthma. 6. History of atrial fibrillation on chronic Coumadin with severe     bradycardia in the hospital on 50 mg of metoprolol twice a day -     dose was drastically reduced. 7. Gout. 8. Chronic kidney stage III. 9. Hyperlipidemia.  DISCHARGE MEDICATIONS: 1. Lasix 40 mg daily. 2. Aspirin 81 mg daily. 3. Potassium chloride 20 mEq daily. 4. Isordil 10 mg 3 times a day. 5. Colace 100 mg twice a day. 6. Lasix 40 mg daily. 7. Hydralazine 10 mg 3 times a day. 8. Oxybutynin extended release 10 mg daily. 9. Allopurinol 100 mg daily. 10.Guaifenesin 10 mg at bedtime. 11.Azopt 1 drop both eyes daily. 12.Alphagan 1 drop both eyes daily. 13.Lubricant eyedrops 1 drop both eyes daily. 14.Pilocarpine 1 drop both eyes 4 times a day. 15.Coumadin 1-1/2 tablets of 5 mg on Friday, then 5 mg all the rest of     the days. 16.Metoprolol 25 mg daily. 17.Amlodipine/benazepril 10/20 daily. 18.Atorvastatin 20 mg daily. 19.Lumigan 1 drop both eyes daily.  CONDITION ON DISCHARGE:  Veronica Powers was discharged in good condition. She was instructed to follow up with her primary care physician, Dr. [name].  She was also told to follow up with a primary cardiologist, Dr. Sherryl Manges.  PROCEDURE THIS ADMISSION: 1. The patient underwent a  portable chest x-ray.  Findings of     cardiomegaly, vascular congestion. 2. Echocardiogram on Jun 03, 2010, showing ejection fraction of 60%-     65%, LVH, grade 2 diastolic dysfunction, moderate aortic stenosis,     pulmonary artery pressure of 71 mmHg.  HISTORY AND PHYSICAL:  Refer to dictated H and P done by Dr. Delsa Grana.  HOSPITAL COURSE:  Veronica Powers, an 77 year old woman with a history of asthma, hypertension, and aortic stenosis, presented to emergency room with worsening shortness of breath.  She was diagnosed with acute on chronic congestive heart failure exacerbation of the diastolic type and she was admitted to the hospital and placed on BiPAP.  After diuresis with intravenous Lasix, she improved significantly.  She was resumed on all her home medications including metoprolol 50 mg twice a day.  It was noted that on that dose of metoprolol, she became bradycardic to the 30s and 40s.  We had to drastically reduce the dose of metoprolol and she was sent home on 25 mg of metoprolol daily.  She will need to follow up with her primary cardiologist, Dr. Sherryl Manges to assure  that she does not have tachybrady syndrome.  Otherwise, the patient achieved euvolemia by Jun 06, 2010, and all her antihypertensives were resumed as prior to this admission obviously with the exception of the decreased dose in the metoprolol.  We have also increased the dose of hydralazine to 10 mg 3 times a day and we have added isosorbide dinitrate 10 mg 3 times a day. The patient seems to be tolerating the changes well and she is stable for discharge today, Jun 06, 2010.     Lonia Blood, M.D.     SL/MEDQ  D:  06/06/2010  T:  06/07/2010  Job:  [id number]  cc:   [name], M.D. Duke Salvia, MD, [facility]  Electronically Signed by Lonia Blood M.D. on 06/14/2010 08:18:41 AM

## 2010-06-19 ENCOUNTER — Ambulatory Visit (INDEPENDENT_AMBULATORY_CARE_PROVIDER_SITE_OTHER): Payer: PRIVATE HEALTH INSURANCE | Admitting: *Deleted

## 2010-06-19 DIAGNOSIS — I4891 Unspecified atrial fibrillation: Secondary | ICD-10-CM

## 2010-06-19 LAB — POCT INR: INR: 2.8

## 2010-06-25 ENCOUNTER — Encounter: Payer: Self-pay | Admitting: Internal Medicine

## 2010-07-10 ENCOUNTER — Ambulatory Visit (INDEPENDENT_AMBULATORY_CARE_PROVIDER_SITE_OTHER): Payer: PRIVATE HEALTH INSURANCE | Admitting: *Deleted

## 2010-07-10 DIAGNOSIS — I4891 Unspecified atrial fibrillation: Secondary | ICD-10-CM

## 2010-07-10 LAB — POCT INR: INR: 1.8

## 2010-07-18 ENCOUNTER — Encounter: Payer: Self-pay | Admitting: Internal Medicine

## 2010-07-21 ENCOUNTER — Encounter: Payer: Self-pay | Admitting: Internal Medicine

## 2010-07-21 ENCOUNTER — Ambulatory Visit (INDEPENDENT_AMBULATORY_CARE_PROVIDER_SITE_OTHER): Payer: PRIVATE HEALTH INSURANCE | Admitting: Internal Medicine

## 2010-07-21 ENCOUNTER — Ambulatory Visit (INDEPENDENT_AMBULATORY_CARE_PROVIDER_SITE_OTHER): Payer: PRIVATE HEALTH INSURANCE | Admitting: *Deleted

## 2010-07-21 DIAGNOSIS — I4891 Unspecified atrial fibrillation: Secondary | ICD-10-CM

## 2010-07-21 DIAGNOSIS — I359 Nonrheumatic aortic valve disorder, unspecified: Secondary | ICD-10-CM

## 2010-07-21 DIAGNOSIS — I509 Heart failure, unspecified: Secondary | ICD-10-CM

## 2010-07-21 DIAGNOSIS — I498 Other specified cardiac arrhythmias: Secondary | ICD-10-CM

## 2010-07-21 DIAGNOSIS — I10 Essential (primary) hypertension: Secondary | ICD-10-CM

## 2010-07-21 DIAGNOSIS — I119 Hypertensive heart disease without heart failure: Secondary | ICD-10-CM

## 2010-07-21 DIAGNOSIS — I5032 Chronic diastolic (congestive) heart failure: Secondary | ICD-10-CM

## 2010-07-21 NOTE — Progress Notes (Signed)
  HPI  Veronica Powers is a 77 y.o. female Seen in followup for bradycardia in the setting of paroxysmal atrial fibrillation moderate aortic stenosis. She was recently hospitalized for fluid overload. Ultrasound in May 2005 demonstrated normal left ventricular function with evidence of diastolic dysfunction.  Her metoprolol dose had been increased by someone else was re\re decreased. She is doing better with improved exercise tolerance.  Past Medical History  Diagnosis Date  . Atrial fibrillation     permanent  . Atrial fibrillation with controlled ventricular response   . Stroke   . Aortic stenosis, moderate   . Diabetes mellitus     type 2    No past surgical history on file.  Current Outpatient Prescriptions  Medication Sig Dispense Refill  . allopurinol (ZYLOPRIM) 100 MG tablet Take 100 mg by mouth daily.        Marland Kitchen amLODipine-benazepril (LOTREL) 10-20 MG per capsule Take 1 capsule by mouth daily.        Marland Kitchen aspirin 81 MG tablet Take 81 mg by mouth daily.        Marland Kitchen atorvastatin (LIPITOR) 20 MG tablet Take 20 mg by mouth daily.        . bimatoprost (LUMIGAN) 0.03 % ophthalmic solution Place 1 drop into both eyes at bedtime.        . brimonidine (ALPHAGAN) 0.15 % ophthalmic solution Place 1 drop into both eyes 2 (two) times daily.        . brinzolamide (AZOPT) 1 % ophthalmic suspension Place 1 drop into both eyes 2 (two) times daily.        . carboxymethylcellulose (REFRESH PLUS) 0.5 % SOLN Place 1 drop into both eyes as needed.        . furosemide (LASIX) 40 MG tablet Take 40 mg by mouth daily.        Marland Kitchen guanFACINE (TENEX) 2 MG tablet Take 1 mg by mouth at bedtime.        . hydrALAZINE (APRESOLINE) 10 MG tablet Take 10 mg by mouth 2 (two) times daily.        . indomethacin (INDOCIN) 25 MG capsule Take 25 mg by mouth daily.        . metoprolol (LOPRESSOR) 50 MG tablet Take 50 mg by mouth 2 (two) times daily.        Marland Kitchen oxybutynin (DITROPAN-XL) 10 MG 24 hr tablet Take 10 mg by mouth daily.         . pilocarpine (PILOCAR) 4 % ophthalmic solution Place 1 drop into both eyes 4 (four) times daily.        Marland Kitchen warfarin (COUMADIN) 5 MG tablet take by mouth as directed BY COUMADIN CLINIC  35 tablet  3    Allergies  Allergen Reactions  . Nifedipine     REACTION: made her weak    Review of Systems negative except from HPI and PMH  Physical Exam Well developed and well nourished in no acute distress HENT normal E scleral and icterus clear Neck Supple Clear to ausculation Regular rate and rhythm, 2/6 systolic murmur along the right sternal border Soft with active bowel sounds No clubbing cyanosis and edema Alert and oriented, grossly normal motor and sensory function Skin Warm and Dry  ECG Sinus rhythm at 48 Intervals 0.15/0.10/0.51 Axis is leftward at -1  Assessment and  Plan

## 2010-07-21 NOTE — Assessment & Plan Note (Signed)
Mild to moderate by recent echo

## 2010-07-21 NOTE — Assessment & Plan Note (Addendum)
No intercurrent recurrences. Would stop aspirin

## 2010-07-21 NOTE — Patient Instructions (Signed)
Your physician wants you to follow-up in: 1 year  You will receive a reminder letter in the mail two months in advance. If you don't receive a letter, please call our office to schedule the follow-up appointment.  Your physician recommends that you continue on your current medications as directed. Please refer to the Current Medication list given to you today.  

## 2010-07-21 NOTE — Assessment & Plan Note (Signed)
Remains largely asymptomatic; we'll continue to observe

## 2010-07-21 NOTE — Assessment & Plan Note (Signed)
Will continue on her current medications for now. She is to see Dr. Chestine Spore in the next few weeks

## 2010-07-24 ENCOUNTER — Encounter: Payer: PRIVATE HEALTH INSURANCE | Admitting: *Deleted

## 2010-08-11 ENCOUNTER — Encounter: Payer: PRIVATE HEALTH INSURANCE | Admitting: *Deleted

## 2010-08-14 ENCOUNTER — Ambulatory Visit (INDEPENDENT_AMBULATORY_CARE_PROVIDER_SITE_OTHER): Payer: PRIVATE HEALTH INSURANCE | Admitting: *Deleted

## 2010-08-14 DIAGNOSIS — I4891 Unspecified atrial fibrillation: Secondary | ICD-10-CM

## 2010-08-14 LAB — POCT INR: INR: 3.1

## 2010-08-17 ENCOUNTER — Emergency Department (HOSPITAL_COMMUNITY): Payer: PRIVATE HEALTH INSURANCE

## 2010-08-17 ENCOUNTER — Emergency Department (HOSPITAL_COMMUNITY)
Admission: EM | Admit: 2010-08-17 | Discharge: 2010-08-17 | Disposition: A | Discharge status: Home / Self Care | Payer: PRIVATE HEALTH INSURANCE | Attending: Emergency Medicine | Admitting: Emergency Medicine

## 2010-08-17 DIAGNOSIS — I10 Essential (primary) hypertension: Secondary | ICD-10-CM | POA: Insufficient documentation

## 2010-08-17 DIAGNOSIS — W06XXXA Fall from bed, initial encounter: Secondary | ICD-10-CM | POA: Insufficient documentation

## 2010-08-17 DIAGNOSIS — J45909 Unspecified asthma, uncomplicated: Secondary | ICD-10-CM | POA: Insufficient documentation

## 2010-08-17 DIAGNOSIS — S2239XA Fracture of one rib, unspecified side, initial encounter for closed fracture: Secondary | ICD-10-CM | POA: Insufficient documentation

## 2010-08-17 DIAGNOSIS — I4891 Unspecified atrial fibrillation: Secondary | ICD-10-CM | POA: Insufficient documentation

## 2010-08-17 DIAGNOSIS — Z8679 Personal history of other diseases of the circulatory system: Secondary | ICD-10-CM | POA: Insufficient documentation

## 2010-08-17 DIAGNOSIS — E7800 Pure hypercholesterolemia, unspecified: Secondary | ICD-10-CM | POA: Insufficient documentation

## 2010-09-04 ENCOUNTER — Encounter: Payer: PRIVATE HEALTH INSURANCE | Admitting: *Deleted

## 2010-09-05 ENCOUNTER — Ambulatory Visit (INDEPENDENT_AMBULATORY_CARE_PROVIDER_SITE_OTHER): Payer: PRIVATE HEALTH INSURANCE | Admitting: *Deleted

## 2010-09-05 DIAGNOSIS — I4891 Unspecified atrial fibrillation: Secondary | ICD-10-CM

## 2010-09-05 LAB — POCT INR: INR: 2.7

## 2010-09-22 ENCOUNTER — Other Ambulatory Visit: Payer: Self-pay | Admitting: Internal Medicine

## 2010-10-03 ENCOUNTER — Encounter: Payer: PRIVATE HEALTH INSURANCE | Admitting: *Deleted

## 2010-10-29 ENCOUNTER — Ambulatory Visit (INDEPENDENT_AMBULATORY_CARE_PROVIDER_SITE_OTHER): Payer: PRIVATE HEALTH INSURANCE | Admitting: *Deleted

## 2010-10-29 DIAGNOSIS — I4891 Unspecified atrial fibrillation: Secondary | ICD-10-CM

## 2010-11-26 ENCOUNTER — Encounter: Payer: PRIVATE HEALTH INSURANCE | Admitting: *Deleted

## 2011-02-15 ENCOUNTER — Other Ambulatory Visit: Payer: Self-pay | Admitting: Internal Medicine

## 2011-02-19 ENCOUNTER — Telehealth: Payer: Self-pay | Admitting: *Deleted

## 2011-02-19 NOTE — Telephone Encounter (Signed)
Attempt contact with patient via phone and letter to schedule appointment, last here at clinic was 10/2010.

## 2011-02-24 ENCOUNTER — Encounter: Payer: PRIVATE HEALTH INSURANCE | Admitting: *Deleted

## 2011-02-27 ENCOUNTER — Ambulatory Visit (INDEPENDENT_AMBULATORY_CARE_PROVIDER_SITE_OTHER): Payer: PRIVATE HEALTH INSURANCE

## 2011-02-27 DIAGNOSIS — I4891 Unspecified atrial fibrillation: Secondary | ICD-10-CM

## 2011-02-27 DIAGNOSIS — Z7901 Long term (current) use of anticoagulants: Secondary | ICD-10-CM

## 2011-02-27 MED ORDER — WARFARIN SODIUM 5 MG PO TABS: ORAL_TABLET | ORAL | Status: DC

## 2011-03-24 ENCOUNTER — Ambulatory Visit (INDEPENDENT_AMBULATORY_CARE_PROVIDER_SITE_OTHER): Payer: PRIVATE HEALTH INSURANCE | Admitting: *Deleted

## 2011-03-24 DIAGNOSIS — I4891 Unspecified atrial fibrillation: Secondary | ICD-10-CM

## 2011-03-24 DIAGNOSIS — Z7901 Long term (current) use of anticoagulants: Secondary | ICD-10-CM

## 2011-03-24 LAB — POCT INR: INR: 2.9

## 2011-03-30 ENCOUNTER — Other Ambulatory Visit: Payer: Self-pay | Admitting: Internal Medicine

## 2011-04-14 ENCOUNTER — Ambulatory Visit (INDEPENDENT_AMBULATORY_CARE_PROVIDER_SITE_OTHER): Payer: PRIVATE HEALTH INSURANCE | Admitting: Pharmacist

## 2011-04-14 DIAGNOSIS — I4891 Unspecified atrial fibrillation: Secondary | ICD-10-CM

## 2011-04-14 DIAGNOSIS — Z7901 Long term (current) use of anticoagulants: Secondary | ICD-10-CM

## 2011-04-14 LAB — POCT INR: INR: 1.4

## 2011-04-15 ENCOUNTER — Other Ambulatory Visit: Payer: PRIVATE HEALTH INSURANCE

## 2011-04-22 ENCOUNTER — Ambulatory Visit
Admission: RE | Admit: 2011-04-22 | Discharge: 2011-04-22 | Disposition: A | Discharge status: Home / Self Care | Payer: PRIVATE HEALTH INSURANCE | Source: Ambulatory Visit | Attending: Internal Medicine | Admitting: Internal Medicine

## 2011-04-22 DIAGNOSIS — Z780 Asymptomatic menopausal state: Secondary | ICD-10-CM

## 2011-05-15 ENCOUNTER — Ambulatory Visit (INDEPENDENT_AMBULATORY_CARE_PROVIDER_SITE_OTHER): Payer: PRIVATE HEALTH INSURANCE | Admitting: Pharmacist

## 2011-05-15 DIAGNOSIS — I4891 Unspecified atrial fibrillation: Secondary | ICD-10-CM

## 2011-05-15 DIAGNOSIS — Z7901 Long term (current) use of anticoagulants: Secondary | ICD-10-CM

## 2011-05-29 ENCOUNTER — Ambulatory Visit (INDEPENDENT_AMBULATORY_CARE_PROVIDER_SITE_OTHER): Payer: PRIVATE HEALTH INSURANCE | Admitting: *Deleted

## 2011-05-29 DIAGNOSIS — Z7901 Long term (current) use of anticoagulants: Secondary | ICD-10-CM

## 2011-05-29 DIAGNOSIS — I4891 Unspecified atrial fibrillation: Secondary | ICD-10-CM

## 2011-05-29 LAB — POCT INR: INR: 2.7

## 2011-06-19 ENCOUNTER — Ambulatory Visit (INDEPENDENT_AMBULATORY_CARE_PROVIDER_SITE_OTHER): Payer: PRIVATE HEALTH INSURANCE | Admitting: Pharmacist

## 2011-06-19 DIAGNOSIS — I4891 Unspecified atrial fibrillation: Secondary | ICD-10-CM

## 2011-06-19 DIAGNOSIS — Z7901 Long term (current) use of anticoagulants: Secondary | ICD-10-CM

## 2011-06-19 LAB — POCT INR: INR: 1.2

## 2011-07-03 ENCOUNTER — Ambulatory Visit (INDEPENDENT_AMBULATORY_CARE_PROVIDER_SITE_OTHER): Payer: PRIVATE HEALTH INSURANCE | Admitting: *Deleted

## 2011-07-03 DIAGNOSIS — Z7901 Long term (current) use of anticoagulants: Secondary | ICD-10-CM

## 2011-07-03 DIAGNOSIS — I4891 Unspecified atrial fibrillation: Secondary | ICD-10-CM

## 2011-07-17 ENCOUNTER — Ambulatory Visit (INDEPENDENT_AMBULATORY_CARE_PROVIDER_SITE_OTHER): Payer: PRIVATE HEALTH INSURANCE

## 2011-07-17 DIAGNOSIS — I4891 Unspecified atrial fibrillation: Secondary | ICD-10-CM

## 2011-07-17 DIAGNOSIS — Z7901 Long term (current) use of anticoagulants: Secondary | ICD-10-CM

## 2011-08-09 ENCOUNTER — Other Ambulatory Visit: Payer: Self-pay | Admitting: Internal Medicine

## 2011-08-20 ENCOUNTER — Telehealth: Payer: Self-pay | Admitting: Internal Medicine

## 2011-08-20 ENCOUNTER — Ambulatory Visit (INDEPENDENT_AMBULATORY_CARE_PROVIDER_SITE_OTHER): Payer: PRIVATE HEALTH INSURANCE

## 2011-08-20 DIAGNOSIS — Z7901 Long term (current) use of anticoagulants: Secondary | ICD-10-CM

## 2011-08-20 DIAGNOSIS — I4891 Unspecified atrial fibrillation: Secondary | ICD-10-CM

## 2011-08-20 LAB — PROTIME-INR
INR: 4.6 — ABNORMAL HIGH (ref <1.50)
Prothrombin Time: 44.1 seconds — ABNORMAL HIGH (ref 11.6–15.2)

## 2011-08-20 NOTE — Telephone Encounter (Signed)
New msg Pt's daughter called and said she saw Dr Chestine Spore and he put her on colcrys.0.6mg . She wanted you to be aware

## 2011-08-20 NOTE — Telephone Encounter (Signed)
Will forward to Kennon Rounds to see if any contraindication with the patient's current meds.

## 2011-08-21 NOTE — Telephone Encounter (Signed)
LM for pt's daughter.  Colcrys may increase INR but Coumadin dose was adjusted on 8/1 to account for other new gout medications.  Will continue her new dose she was given yesterday until her next appt.

## 2011-09-02 ENCOUNTER — Ambulatory Visit (INDEPENDENT_AMBULATORY_CARE_PROVIDER_SITE_OTHER): Payer: PRIVATE HEALTH INSURANCE | Admitting: *Deleted

## 2011-09-02 DIAGNOSIS — Z7901 Long term (current) use of anticoagulants: Secondary | ICD-10-CM

## 2011-09-02 DIAGNOSIS — I4891 Unspecified atrial fibrillation: Secondary | ICD-10-CM

## 2011-09-02 LAB — POCT INR: INR: 2.9

## 2011-09-08 ENCOUNTER — Telehealth: Payer: Self-pay | Admitting: Internal Medicine

## 2011-09-08 NOTE — Telephone Encounter (Addendum)
New msg [facility] wants tests results faxed to her [phone]

## 2011-09-08 NOTE — Telephone Encounter (Signed)
I left a message for Veronica Powers to call as I am not sure what test results she is looking for.

## 2011-09-09 NOTE — Telephone Encounter (Signed)
I left a message for Joni Reining to call.

## 2011-09-09 NOTE — Telephone Encounter (Signed)
Per Joni Reining, she needs the patient's most recent EF/ copy of her echo report. I have faxed a report from 06/03/10 to [phone].

## 2011-09-24 ENCOUNTER — Ambulatory Visit (INDEPENDENT_AMBULATORY_CARE_PROVIDER_SITE_OTHER): Payer: PRIVATE HEALTH INSURANCE | Admitting: Pharmacist

## 2011-09-24 DIAGNOSIS — Z7901 Long term (current) use of anticoagulants: Secondary | ICD-10-CM

## 2011-09-24 DIAGNOSIS — I4891 Unspecified atrial fibrillation: Secondary | ICD-10-CM

## 2011-10-02 ENCOUNTER — Encounter: Payer: Self-pay | Admitting: Internal Medicine

## 2011-10-02 ENCOUNTER — Telehealth: Payer: Self-pay | Admitting: *Deleted

## 2011-10-02 ENCOUNTER — Ambulatory Visit (INDEPENDENT_AMBULATORY_CARE_PROVIDER_SITE_OTHER): Payer: PRIVATE HEALTH INSURANCE | Admitting: Internal Medicine

## 2011-10-02 ENCOUNTER — Ambulatory Visit (INDEPENDENT_AMBULATORY_CARE_PROVIDER_SITE_OTHER): Payer: PRIVATE HEALTH INSURANCE | Admitting: *Deleted

## 2011-10-02 VITALS — BP 164/55 | HR 33 | Wt 139.0 lb

## 2011-10-02 DIAGNOSIS — I119 Hypertensive heart disease without heart failure: Secondary | ICD-10-CM

## 2011-10-02 DIAGNOSIS — I359 Nonrheumatic aortic valve disorder, unspecified: Secondary | ICD-10-CM

## 2011-10-02 DIAGNOSIS — R001 Bradycardia, unspecified: Secondary | ICD-10-CM

## 2011-10-02 DIAGNOSIS — I498 Other specified cardiac arrhythmias: Secondary | ICD-10-CM

## 2011-10-02 DIAGNOSIS — Z7901 Long term (current) use of anticoagulants: Secondary | ICD-10-CM

## 2011-10-02 DIAGNOSIS — I4891 Unspecified atrial fibrillation: Secondary | ICD-10-CM

## 2011-10-02 LAB — POCT INR: INR: 6.5

## 2011-10-02 MED ORDER — BENAZEPRIL HCL 20 MG PO TABS: 20.0000 mg | ORAL_TABLET | Freq: Every day | ORAL | Status: DC

## 2011-10-02 MED ORDER — HYDRALAZINE HCL 10 MG PO TABS: ORAL_TABLET | ORAL | Status: DC

## 2011-10-02 NOTE — Assessment & Plan Note (Addendum)
As above.

## 2011-10-02 NOTE — Assessment & Plan Note (Signed)
Mild by exam; least echo 2011 showed that  Will folllwo

## 2011-10-02 NOTE — Assessment & Plan Note (Signed)
Patient has profound bradycardia with junctional rhythm. She is however asymptomatic. She is taking 2 medications one of which is certainly contributing and 1 which may be. I will stop her Lopressor. We will also change her Lotrel to benazepril. We will eliminate amlodipine calcium component. We'll increase her hydralazine to help offset the change in blood pressure medications. We will see her next week to reassess her symptoms and her bradycardia. She is advised to go to the hospital if she has any intercurrent symptoms

## 2011-10-02 NOTE — Patient Instructions (Signed)
Your physician has recommended you make the following change in your medication:  1) Stop amlodipine-benazepril. 2) Start benazepril 20 mg one tablet by mouth daily. 3) Increase hydralazine to 10 mg two tablets by mouth twice daily. 4) Stop metoprolol.  Return in 1 week for an EKG with the nurse.  Your physician wants you to follow-up in: 1 year with Dr. Graciela Husbands. You will receive a reminder letter in the mail two months in advance. If you don't receive a letter, please call our office to schedule the follow-up appointment.

## 2011-10-02 NOTE — Patient Instructions (Addendum)
Anticoagulation safety with elevated INR

## 2011-10-02 NOTE — Progress Notes (Signed)
Patient Care Team: Laurena Slimmer, MD as PCP - General (Endocrinology)   HPI  Veronica Powers is a 78 y.o. female Seen in followup for bradycardia in the setting of paroxysmal atrial fibrillation and moderate aortic stenosis.   She has had a couple of recent illnesses and has lost 30 pounds associated with anorexia. Her weight is improving. She denies lightheadedness or worsening exercise intolerance.  Past Medical History  Diagnosis Date  . Atrial fibrillation     permanent  . Atrial fibrillation with controlled ventricular response   . Stroke   . Aortic stenosis, moderate   . Diabetes mellitus     type 2    No past surgical history on file.  Current Outpatient Prescriptions  Medication Sig Dispense Refill  . allopurinol (ZYLOPRIM) 100 MG tablet Take 100 mg by mouth daily.        Marland Kitchen amLODipine-benazepril (LOTREL) 10-20 MG per capsule Take 1 capsule by mouth daily.        Marland Kitchen atorvastatin (LIPITOR) 20 MG tablet Take 20 mg by mouth daily.        . bimatoprost (LUMIGAN) 0.03 % ophthalmic solution Place 1 drop into both eyes at bedtime.        . brimonidine (ALPHAGAN) 0.15 % ophthalmic solution Place 1 drop into both eyes 2 (two) times daily.        . brinzolamide (AZOPT) 1 % ophthalmic suspension Place 1 drop into both eyes 2 (two) times daily.        . carboxymethylcellulose (REFRESH PLUS) 0.5 % SOLN Place 1 drop into both eyes as needed.        . COLCRYS 0.6 MG tablet prn      . fluticasone (FLONASE) 50 MCG/ACT nasal spray prn      . furosemide (LASIX) 40 MG tablet Take 40 mg by mouth daily.        Marland Kitchen guanFACINE (TENEX) 2 MG tablet Take 1 mg by mouth at bedtime.        . hydrALAZINE (APRESOLINE) 10 MG tablet Take 10 mg by mouth 2 (two) times daily.        . indomethacin (INDOCIN) 25 MG capsule Take 25 mg by mouth daily.        . metoprolol (LOPRESSOR) 50 MG tablet Take 50 mg by mouth 2 (two) times daily.        Marland Kitchen oxybutynin (DITROPAN-XL) 10 MG 24 hr tablet Take 10 mg by mouth  daily.        . pilocarpine (PILOCAR) 4 % ophthalmic solution Place 1 drop into both eyes 4 (four) times daily.        Marland Kitchen warfarin (COUMADIN) 5 MG tablet TAKE AS DIRECTED BY ANTICOAGULATION CLINIC  40 tablet  2    Allergies  Allergen Reactions  . Nifedipine     REACTION: made her weak    Review of Systems negative except from HPI and PMH  Physical Exam BP 164/55  Pulse 33  Wt 139 lb (63.05 kg) Well developed and well nourished in no acute distress HENT normal E scleral and icterus clear Neck Supple JVP flat; carotids brisk and full Clear to ausculation Slow but Regular rate and rhythm, 2/6 systolic murmur with a retained split S2 Soft with active bowel sounds No clubbing cyanosis 1+ Edema Alert and oriented, grossly normal motor and sensory function Skin Warm and Dry  Electrocardiogram demonstrates junctional rhythm at 33 Intervals-/11/52 with a QTC of 390 Axis -62 1 QRS morphology is similar to  sinus rhythm a year ago  Assessment and  Plan

## 2011-10-02 NOTE — Telephone Encounter (Signed)
Left message for patient to call and schedule nurse visit appointment for EKG per Dr. Graciela Husbands.

## 2011-10-09 ENCOUNTER — Ambulatory Visit (INDEPENDENT_AMBULATORY_CARE_PROVIDER_SITE_OTHER): Payer: PRIVATE HEALTH INSURANCE | Admitting: *Deleted

## 2011-10-09 DIAGNOSIS — R001 Bradycardia, unspecified: Secondary | ICD-10-CM

## 2011-10-09 DIAGNOSIS — I498 Other specified cardiac arrhythmias: Secondary | ICD-10-CM

## 2011-10-09 DIAGNOSIS — Z7901 Long term (current) use of anticoagulants: Secondary | ICD-10-CM

## 2011-10-09 DIAGNOSIS — I4891 Unspecified atrial fibrillation: Secondary | ICD-10-CM

## 2011-10-09 NOTE — Progress Notes (Signed)
Pt here for EKG--previous bradycardia--reviewed with dr jordan--pt back in sinus rhythm

## 2011-10-19 ENCOUNTER — Other Ambulatory Visit: Payer: Self-pay | Admitting: Internal Medicine

## 2011-10-19 ENCOUNTER — Ambulatory Visit (HOSPITAL_COMMUNITY)
Admission: RE | Admit: 2011-10-19 | Discharge: 2011-10-19 | Disposition: A | Discharge status: Home / Self Care | Payer: PRIVATE HEALTH INSURANCE | Source: Ambulatory Visit | Attending: Internal Medicine | Admitting: Internal Medicine

## 2011-10-19 ENCOUNTER — Ambulatory Visit (INDEPENDENT_AMBULATORY_CARE_PROVIDER_SITE_OTHER): Payer: PRIVATE HEALTH INSURANCE | Admitting: *Deleted

## 2011-10-19 DIAGNOSIS — I517 Cardiomegaly: Secondary | ICD-10-CM | POA: Insufficient documentation

## 2011-10-19 DIAGNOSIS — R0602 Shortness of breath: Secondary | ICD-10-CM | POA: Insufficient documentation

## 2011-10-19 DIAGNOSIS — I4891 Unspecified atrial fibrillation: Secondary | ICD-10-CM

## 2011-10-19 DIAGNOSIS — Z7901 Long term (current) use of anticoagulants: Secondary | ICD-10-CM

## 2011-10-19 LAB — POCT INR: INR: 2.1

## 2011-10-21 ENCOUNTER — Other Ambulatory Visit: Payer: Self-pay | Admitting: Internal Medicine

## 2011-10-21 DIAGNOSIS — J984 Other disorders of lung: Secondary | ICD-10-CM

## 2011-10-21 NOTE — Progress Notes (Signed)
I AM UNABLE TO CLOSE THIS ENCOUNTER

## 2011-10-22 ENCOUNTER — Ambulatory Visit (HOSPITAL_COMMUNITY)
Admission: RE | Admit: 2011-10-22 | Discharge: 2011-10-22 | Disposition: A | Discharge status: Home / Self Care | Payer: PRIVATE HEALTH INSURANCE | Source: Ambulatory Visit | Attending: Internal Medicine | Admitting: Internal Medicine

## 2011-10-22 DIAGNOSIS — N281 Cyst of kidney, acquired: Secondary | ICD-10-CM | POA: Insufficient documentation

## 2011-10-22 DIAGNOSIS — E041 Nontoxic single thyroid nodule: Secondary | ICD-10-CM | POA: Insufficient documentation

## 2011-10-22 DIAGNOSIS — I517 Cardiomegaly: Secondary | ICD-10-CM | POA: Insufficient documentation

## 2011-10-22 DIAGNOSIS — I2510 Atherosclerotic heart disease of native coronary artery without angina pectoris: Secondary | ICD-10-CM | POA: Insufficient documentation

## 2011-10-22 DIAGNOSIS — I700 Atherosclerosis of aorta: Secondary | ICD-10-CM | POA: Insufficient documentation

## 2011-10-22 DIAGNOSIS — J984 Other disorders of lung: Secondary | ICD-10-CM

## 2011-10-22 MED ORDER — IOHEXOL 300 MG/ML  SOLN
80.0000 mL | Freq: Once | INTRAMUSCULAR | Status: AC | PRN
  Administered 2011-10-22: 80 mL via INTRAVENOUS

## 2011-10-26 ENCOUNTER — Other Ambulatory Visit: Payer: Self-pay | Admitting: Internal Medicine

## 2011-11-10 ENCOUNTER — Ambulatory Visit (INDEPENDENT_AMBULATORY_CARE_PROVIDER_SITE_OTHER): Payer: PRIVATE HEALTH INSURANCE | Admitting: Pharmacist

## 2011-11-10 DIAGNOSIS — I4891 Unspecified atrial fibrillation: Secondary | ICD-10-CM

## 2011-11-10 DIAGNOSIS — Z7901 Long term (current) use of anticoagulants: Secondary | ICD-10-CM

## 2011-11-10 LAB — POCT INR: INR: 2.4

## 2011-11-17 ENCOUNTER — Other Ambulatory Visit: Payer: Self-pay | Admitting: *Deleted

## 2011-11-17 MED ORDER — WARFARIN SODIUM 5 MG PO TABS: 5.0000 mg | ORAL_TABLET | ORAL | Status: DC

## 2011-12-01 ENCOUNTER — Ambulatory Visit (INDEPENDENT_AMBULATORY_CARE_PROVIDER_SITE_OTHER): Payer: PRIVATE HEALTH INSURANCE | Admitting: *Deleted

## 2011-12-01 DIAGNOSIS — I4891 Unspecified atrial fibrillation: Secondary | ICD-10-CM

## 2011-12-01 DIAGNOSIS — Z7901 Long term (current) use of anticoagulants: Secondary | ICD-10-CM

## 2011-12-01 LAB — POCT INR: INR: 2.2

## 2012-01-01 ENCOUNTER — Ambulatory Visit (INDEPENDENT_AMBULATORY_CARE_PROVIDER_SITE_OTHER): Payer: PRIVATE HEALTH INSURANCE | Admitting: *Deleted

## 2012-01-01 DIAGNOSIS — Z7901 Long term (current) use of anticoagulants: Secondary | ICD-10-CM

## 2012-01-01 DIAGNOSIS — I4891 Unspecified atrial fibrillation: Secondary | ICD-10-CM

## 2012-01-01 LAB — POCT INR: INR: 2.6

## 2012-02-19 ENCOUNTER — Inpatient Hospital Stay (HOSPITAL_COMMUNITY)
Admission: EM | Admit: 2012-02-19 | Discharge: 2012-02-22 | DRG: 154 | Disposition: A | Discharge status: Home / Self Care | Payer: PRIVATE HEALTH INSURANCE | Attending: Cardiology | Admitting: Cardiology

## 2012-02-19 ENCOUNTER — Encounter (HOSPITAL_COMMUNITY): Payer: Self-pay | Admitting: *Deleted

## 2012-02-19 ENCOUNTER — Emergency Department (HOSPITAL_COMMUNITY): Payer: PRIVATE HEALTH INSURANCE

## 2012-02-19 DIAGNOSIS — I69998 Other sequelae following unspecified cerebrovascular disease: Secondary | ICD-10-CM

## 2012-02-19 DIAGNOSIS — K1120 Sialoadenitis, unspecified: Principal | ICD-10-CM | POA: Insufficient documentation

## 2012-02-19 DIAGNOSIS — R220 Localized swelling, mass and lump, head: Secondary | ICD-10-CM

## 2012-02-19 DIAGNOSIS — I498 Other specified cardiac arrhythmias: Secondary | ICD-10-CM

## 2012-02-19 DIAGNOSIS — Z7901 Long term (current) use of anticoagulants: Secondary | ICD-10-CM

## 2012-02-19 DIAGNOSIS — Z8673 Personal history of transient ischemic attack (TIA), and cerebral infarction without residual deficits: Secondary | ICD-10-CM

## 2012-02-19 DIAGNOSIS — L89309 Pressure ulcer of unspecified buttock, unspecified stage: Secondary | ICD-10-CM | POA: Diagnosis present

## 2012-02-19 DIAGNOSIS — D649 Anemia, unspecified: Secondary | ICD-10-CM | POA: Diagnosis present

## 2012-02-19 DIAGNOSIS — I359 Nonrheumatic aortic valve disorder, unspecified: Secondary | ICD-10-CM | POA: Diagnosis present

## 2012-02-19 DIAGNOSIS — I5033 Acute on chronic diastolic (congestive) heart failure: Secondary | ICD-10-CM | POA: Diagnosis present

## 2012-02-19 DIAGNOSIS — I10 Essential (primary) hypertension: Secondary | ICD-10-CM | POA: Diagnosis present

## 2012-02-19 DIAGNOSIS — I4891 Unspecified atrial fibrillation: Secondary | ICD-10-CM

## 2012-02-19 DIAGNOSIS — I509 Heart failure, unspecified: Secondary | ICD-10-CM | POA: Diagnosis present

## 2012-02-19 DIAGNOSIS — E876 Hypokalemia: Secondary | ICD-10-CM | POA: Diagnosis present

## 2012-02-19 DIAGNOSIS — M109 Gout, unspecified: Secondary | ICD-10-CM | POA: Diagnosis present

## 2012-02-19 DIAGNOSIS — R634 Abnormal weight loss: Secondary | ICD-10-CM | POA: Diagnosis present

## 2012-02-19 DIAGNOSIS — H409 Unspecified glaucoma: Secondary | ICD-10-CM | POA: Diagnosis present

## 2012-02-19 DIAGNOSIS — Z79899 Other long term (current) drug therapy: Secondary | ICD-10-CM

## 2012-02-19 DIAGNOSIS — I5022 Chronic systolic (congestive) heart failure: Secondary | ICD-10-CM | POA: Diagnosis not present

## 2012-02-19 DIAGNOSIS — I119 Hypertensive heart disease without heart failure: Secondary | ICD-10-CM

## 2012-02-19 DIAGNOSIS — E119 Type 2 diabetes mellitus without complications: Secondary | ICD-10-CM | POA: Diagnosis present

## 2012-02-19 DIAGNOSIS — L8992 Pressure ulcer of unspecified site, stage 2: Secondary | ICD-10-CM | POA: Diagnosis present

## 2012-02-19 HISTORY — DX: Essential (primary) hypertension: I10

## 2012-02-19 HISTORY — DX: Gout, unspecified: M10.9

## 2012-02-19 HISTORY — DX: Gastro-esophageal reflux disease without esophagitis: K21.9

## 2012-02-19 LAB — CBC WITH DIFFERENTIAL/PLATELET
Eosinophils Relative: 0 % (ref 0–5)
HCT: 33.4 % — ABNORMAL LOW (ref 36.0–46.0)
Lymphocytes Relative: 12 % (ref 12–46)
Lymphs Abs: 1.2 10*3/uL (ref 0.7–4.0)
MCV: 86.8 fL (ref 78.0–100.0)
Monocytes Absolute: 0.7 10*3/uL (ref 0.1–1.0)
RBC: 3.85 MIL/uL — ABNORMAL LOW (ref 3.87–5.11)
RDW: 15.2 % (ref 11.5–15.5)
WBC: 10.2 10*3/uL (ref 4.0–10.5)

## 2012-02-19 LAB — URINE MICROSCOPIC-ADD ON

## 2012-02-19 LAB — URINALYSIS, ROUTINE W REFLEX MICROSCOPIC
Bilirubin Urine: NEGATIVE
Hgb urine dipstick: NEGATIVE
Protein, ur: NEGATIVE mg/dL
Specific Gravity, Urine: 1.013 (ref 1.005–1.030)
Urobilinogen, UA: 0.2 mg/dL (ref 0.0–1.0)

## 2012-02-19 LAB — COMPREHENSIVE METABOLIC PANEL
BUN: 21 mg/dL (ref 6–23)
CO2: 24 mEq/L (ref 19–32)
Calcium: 9.5 mg/dL (ref 8.4–10.5)
Creatinine, Ser: 1.05 mg/dL (ref 0.50–1.10)
GFR calc Af Amer: 55 mL/min — ABNORMAL LOW (ref >90)
GFR calc non Af Amer: 47 mL/min — ABNORMAL LOW (ref >90)
Glucose, Bld: 134 mg/dL — ABNORMAL HIGH (ref 70–99)

## 2012-02-19 LAB — MRSA PCR SCREENING: MRSA by PCR: POSITIVE — AB

## 2012-02-19 LAB — PROTIME-INR: Prothrombin Time: 22.5 seconds — ABNORMAL HIGH (ref 11.6–15.2)

## 2012-02-19 LAB — TSH: TSH: 1.207 u[IU]/mL (ref 0.350–4.500)

## 2012-02-19 LAB — TROPONIN I
Troponin I: 0.49 ng/mL (ref <0.30)
Troponin I: 0.43 ng/mL (ref <0.30)

## 2012-02-19 MED ORDER — ACETAMINOPHEN 325 MG PO TABS
650.0000 mg | ORAL_TABLET | ORAL | Status: DC | PRN
  Administered 2012-02-19 – 2012-02-21 (×4): 650 mg via ORAL
  Filled 2012-02-19 (×4): qty 2

## 2012-02-19 MED ORDER — ALLOPURINOL 100 MG PO TABS
100.0000 mg | ORAL_TABLET | Freq: Two times a day (BID) | ORAL | Status: DC
  Administered 2012-02-19 – 2012-02-22 (×6): 100 mg via ORAL
  Filled 2012-02-19 (×7): qty 1

## 2012-02-19 MED ORDER — ATORVASTATIN CALCIUM 20 MG PO TABS
20.0000 mg | ORAL_TABLET | Freq: Every day | ORAL | Status: DC
  Administered 2012-02-20 – 2012-02-22 (×3): 20 mg via ORAL
  Filled 2012-02-19 (×3): qty 1

## 2012-02-19 MED ORDER — POTASSIUM CHLORIDE 10 MEQ/100ML IV SOLN
10.0000 meq | INTRAVENOUS | Status: AC
  Administered 2012-02-19 (×3): 10 meq via INTRAVENOUS
  Filled 2012-02-19 (×4): qty 100

## 2012-02-19 MED ORDER — ALPRAZOLAM 0.25 MG PO TABS
0.2500 mg | ORAL_TABLET | Freq: Two times a day (BID) | ORAL | Status: DC | PRN
  Administered 2012-02-21 (×2): 0.25 mg via ORAL
  Filled 2012-02-19 (×2): qty 1

## 2012-02-19 MED ORDER — DILTIAZEM HCL 100 MG IV SOLR
5.0000 mg/h | INTRAVENOUS | Status: DC
  Filled 2012-02-19: qty 100

## 2012-02-19 MED ORDER — FUROSEMIDE 40 MG PO TABS
40.0000 mg | ORAL_TABLET | Freq: Every day | ORAL | Status: DC
  Administered 2012-02-20 – 2012-02-22 (×3): 40 mg via ORAL
  Filled 2012-02-19 (×3): qty 1

## 2012-02-19 MED ORDER — SODIUM CHLORIDE 0.9 % IV BOLUS (SEPSIS): 1000.0000 mL | Freq: Once | INTRAVENOUS | Status: DC

## 2012-02-19 MED ORDER — ESMOLOL HCL-SODIUM CHLORIDE 2000 MG/100ML IV SOLN
25.0000 ug/kg/min | INTRAVENOUS | Status: DC
  Administered 2012-02-19 – 2012-02-20 (×2): 75 ug/kg/min via INTRAVENOUS
  Administered 2012-02-20: 100 ug/kg/min via INTRAVENOUS
  Filled 2012-02-19 (×3): qty 100

## 2012-02-19 MED ORDER — SODIUM CHLORIDE 0.9 % IJ SOLN
3.0000 mL | Freq: Two times a day (BID) | INTRAMUSCULAR | Status: DC
  Administered 2012-02-20 – 2012-02-22 (×5): 3 mL via INTRAVENOUS

## 2012-02-19 MED ORDER — SODIUM CHLORIDE 0.9 % IJ SOLN: 3.0000 mL | INTRAMUSCULAR | Status: DC | PRN

## 2012-02-19 MED ORDER — COLCHICINE 0.6 MG PO TABS
0.6000 mg | ORAL_TABLET | Freq: Two times a day (BID) | ORAL | Status: DC
  Administered 2012-02-19 – 2012-02-22 (×6): 0.6 mg via ORAL
  Filled 2012-02-19 (×7): qty 1

## 2012-02-19 MED ORDER — ONDANSETRON HCL 4 MG/2ML IJ SOLN: 4.0000 mg | Freq: Four times a day (QID) | INTRAMUSCULAR | Status: DC | PRN

## 2012-02-19 MED ORDER — GUANFACINE HCL 1 MG PO TABS
1.0000 mg | ORAL_TABLET | Freq: Every day | ORAL | Status: DC
  Filled 2012-02-19: qty 1

## 2012-02-19 MED ORDER — BENAZEPRIL HCL 20 MG PO TABS
20.0000 mg | ORAL_TABLET | Freq: Every day | ORAL | Status: DC
  Administered 2012-02-20 – 2012-02-22 (×3): 20 mg via ORAL
  Filled 2012-02-19 (×3): qty 1

## 2012-02-19 MED ORDER — SODIUM CHLORIDE 0.9 % IV SOLN: 250.0000 mL | INTRAVENOUS | Status: DC | PRN

## 2012-02-19 MED ORDER — ESMOLOL HCL-SODIUM CHLORIDE 2000 MG/100ML IV SOLN
25.0000 ug/kg/min | Freq: Once | INTRAVENOUS | Status: AC
  Administered 2012-02-19: 25.397 ug/kg/min via INTRAVENOUS
  Filled 2012-02-19: qty 100

## 2012-02-19 MED ORDER — POTASSIUM CHLORIDE CRYS ER 20 MEQ PO TBCR
40.0000 meq | EXTENDED_RELEASE_TABLET | Freq: Once | ORAL | Status: AC
  Administered 2012-02-19: 40 meq via ORAL
  Filled 2012-02-19: qty 2

## 2012-02-19 MED ORDER — WARFARIN SODIUM 5 MG PO TABS
5.0000 mg | ORAL_TABLET | Freq: Every day | ORAL | Status: DC
  Administered 2012-02-19 – 2012-02-20 (×2): 5 mg via ORAL
  Filled 2012-02-19 (×3): qty 1

## 2012-02-19 MED ORDER — SODIUM CHLORIDE 0.9 % IV BOLUS (SEPSIS)
500.0000 mL | Freq: Once | INTRAVENOUS | Status: DC
  Administered 2012-02-19: 500 mL via INTRAVENOUS

## 2012-02-19 MED ORDER — ISOSORB DINITRATE-HYDRALAZINE 20-37.5 MG PO TABS
1.0000 | ORAL_TABLET | Freq: Two times a day (BID) | ORAL | Status: DC
  Administered 2012-02-19 – 2012-02-22 (×6): 1 via ORAL
  Filled 2012-02-19 (×7): qty 1

## 2012-02-19 MED ORDER — WARFARIN - PHARMACIST DOSING INPATIENT: Freq: Every day | Status: DC

## 2012-02-19 MED ORDER — GUANFACINE HCL 2 MG PO TABS
2.0000 mg | ORAL_TABLET | Freq: Every day | ORAL | Status: DC
  Administered 2012-02-19: 2 mg via ORAL
  Filled 2012-02-19: qty 1

## 2012-02-19 NOTE — ED Notes (Signed)
IV team to see pt. MD aware.

## 2012-02-19 NOTE — ED Notes (Signed)
Veronica Powers, with speech therapy paged for swallow eval.

## 2012-02-19 NOTE — Evaluation (Signed)
Clinical/Bedside Swallow Evaluation Patient Details  Name: Veronica Powers MRN: [id number] Date of Birth: 1925-07-23  Today's Date: 02/19/2012 Time: 4098-1191 SLP Time Calculation (min): 24 min  Past Medical History:  Past Medical History  Diagnosis Date  . Atrial fibrillation     permanent  . Atrial fibrillation with controlled ventricular response   . Aortic stenosis, moderate   . Diabetes mellitus     type 2  . Stroke     left sided residual  . CHF (congestive heart failure)   . Hypertension   . Gout    Past Surgical History: History reviewed. No pertinent past surgical history. HPI:  Patient with a history of PAF, junctional bradycardia and chronic diastolic CHF presents to [facility] ED today after 3 days of weakness and new facial swelling. She denies overt ischemic, arrhythmic or CHF-type symptoms. Weakness and malaise could be attributed to hypokalemia which would drive paroxysm of a-fib. Question underlying infectious/inflammatory process accounting for facial swelling, and thus potentiating a-fib also. She is afebrile without a leukocytosis, but also elderly and may not mount a large immune response. Facial mass is mildly tender, non erythemetous and occurred acutely overnight ? Parotiditis, sialoadenitis. ENT consult.    Assessment / Plan / Recommendation Clinical Impression  Pt demonstrates baseline oral and oropharyngeal function with no evidence of aspiration and no apparent impact of facial swlling on swallow function. Pt does have some mild difficulty chewing due to missing dentition. Daughter plans to get dentures to pt, but agrees with mechanical soft diet.     Aspiration Risk  None    Diet Recommendation Dysphagia 3 (Mechanical Soft);Thin liquid   Liquid Administration via: Cup;Straw Medication Administration: Whole meds with liquid    Other  Recommendations Oral Care Recommendations: Patient independent with oral care   Follow Up Recommendations  None     Frequency and Duration        Pertinent Vitals/Pain NA    SLP Swallow Goals     Swallow Study Prior Functional Status       General HPI: Patient with a history of PAF, junctional bradycardia and chronic diastolic CHF presents to [facility] ED today after 3 days of weakness and new facial swelling. She denies overt ischemic, arrhythmic or CHF-type symptoms. Weakness and malaise could be attributed to hypokalemia which would drive paroxysm of a-fib. Question underlying infectious/inflammatory process accounting for facial swelling, and thus potentiating a-fib also. She is afebrile without a leukocytosis, but also elderly and may not mount a large immune response. Facial mass is mildly tender, non erythemetous and occurred acutely overnight ? Parotiditis, sialoadenitis. ENT consult.  Type of Study: Bedside swallow evaluation Diet Prior to this Study: Regular;Thin liquids Temperature Spikes Noted: No Respiratory Status: Room air History of Recent Intubation: No Behavior/Cognition: Alert;Cooperative;Pleasant mood Oral Cavity - Dentition: Missing dentition;Dentures, not available Self-Feeding Abilities: Able to feed self;Needs assist Patient Positioning: Upright in bed Baseline Vocal Quality: Clear Volitional Cough: Strong Volitional Swallow: Able to elicit    Oral/Motor/Sensory Function Overall Oral Motor/Sensory Function: Impaired at baseline Labial ROM: Reduced left Labial Symmetry: Abnormal symmetry left Labial Strength: Reduced Labial Sensation: Reduced Lingual ROM: Reduced left Lingual Symmetry: Abnormal symmetry left Lingual Strength: Reduced Lingual Sensation: Reduced Facial ROM: Within Functional Limits   Ice Chips Ice chips: Not tested   Thin Liquid Thin Liquid: Within functional limits    Nectar Thick Nectar Thick Liquid: Not tested   Honey Thick Honey Thick Liquid: Not tested   Puree Puree: Within functional  limits   Solid   GO    Solid: Impaired Presentation:  Self Fed Oral Phase Impairments: Impaired anterior to posterior transit Oral Phase Functional Implications: Left lateral sulci pocketing;Oral residue      Harlon Ditty, MA CCC-SLP [phone]  Claudine Mouton 02/19/2012,4:28 PM

## 2012-02-19 NOTE — ED Provider Notes (Signed)
History     CSN: [id number]  Arrival date & time 02/19/12  1003   First MD Initiated Contact with Patient 02/19/12 1003      Chief Complaint  Patient presents with  . Weakness    (Consider location/radiation/quality/duration/timing/severity/associated sxs/prior treatment) HPI  The patient presents with weakness.  She states that there was no clear precipitant, but over the past few days she has gradually become more weak, diffusely.  She denies focal pain beyond that about her right TMJ, superior natal cleft.  Pain in these areas is also relatively new, but without clear time of onset.  She specifically denies any chest pain, syncope, lightheadedness, vomiting. She states that she takes her medication as directed. She specifically denies dysphasia, dyspnea (2/2 the jaw pain), or any inability to handle her own secretions.   Past Medical History  Diagnosis Date  . Atrial fibrillation     permanent  . Atrial fibrillation with controlled ventricular response   . Aortic stenosis, moderate   . Diabetes mellitus     type 2  . Stroke     left sided residual  . CHF (congestive heart failure)   . Hypertension   . Gout     History reviewed. No pertinent past surgical history.  Family History  Problem Relation Age of Onset  . Coronary artery disease    . Cancer      History  Substance Use Topics  . Smoking status: Never Smoker   . Smokeless tobacco: Never Used  . Alcohol Use: No    OB History    Grav Para Term Preterm Abortions TAB SAB Ect Mult Living                  Review of Systems  Constitutional:       Per HPI, otherwise negative  HENT:       Per HPI, otherwise negative  Eyes: Negative.   Respiratory:       Per HPI, otherwise negative  Cardiovascular:       Per HPI, otherwise negative  Gastrointestinal: Negative for vomiting.  Genitourinary: Negative.   Musculoskeletal:       Per HPI, otherwise negative  Skin: Negative.   Neurological: Negative for  syncope.    Allergies  Nifedipine  Home Medications   Current Outpatient Rx  Name  Route  Sig  Dispense  Refill  . ALLOPURINOL 100 MG PO TABS   Oral   Take 100 mg by mouth daily.           . ATORVASTATIN CALCIUM 20 MG PO TABS   Oral   Take 20 mg by mouth daily.           Marland Kitchen BENAZEPRIL HCL 20 MG PO TABS   Oral   Take 1 tablet (20 mg total) by mouth daily.   30 tablet   6   . BIMATOPROST 0.03 % OP SOLN   Both Eyes   Place 1 drop into both eyes at bedtime.           Marland Kitchen BRIMONIDINE TARTRATE 0.15 % OP SOLN   Both Eyes   Place 1 drop into both eyes 2 (two) times daily.           Marland Kitchen BRINZOLAMIDE 1 % OP SUSP   Both Eyes   Place 1 drop into both eyes 2 (two) times daily.           Marland Kitchen CARBOXYMETHYLCELLULOSE SODIUM 0.5 % OP SOLN   Both  Eyes   Place 1 drop into both eyes as needed.           . COLCRYS 0.6 MG PO TABS      prn         . FLUTICASONE PROPIONATE 50 MCG/ACT NA SUSP      prn         . FUROSEMIDE 40 MG PO TABS   Oral   Take 40 mg by mouth daily.           Marland Kitchen GUANFACINE HCL 2 MG PO TABS   Oral   Take 1 mg by mouth at bedtime.           Marland Kitchen HYDRALAZINE HCL 10 MG PO TABS      Take 2 tablets by mouth twice daily   120 tablet   6   . INDOMETHACIN 25 MG PO CAPS   Oral   Take 25 mg by mouth daily.           . OXYBUTYNIN CHLORIDE ER 10 MG PO TB24   Oral   Take 10 mg by mouth daily.           Marland Kitchen PILOCARPINE HCL 4 % OP SOLN   Both Eyes   Place 1 drop into both eyes 4 (four) times daily.           . WARFARIN SODIUM 5 MG PO TABS   Oral   Take 1 tablet (5 mg total) by mouth as directed.   40 tablet   3     30 day supply     BP 151/104  Pulse 124  Temp 99.4 F (37.4 C) (Oral)  Resp 12  SpO2 98%  Physical Exam  Nursing note and vitals reviewed. Constitutional: She is oriented to person, place, and time. She appears well-developed and well-nourished. She appears ill.  HENT:  Head: Normocephalic and atraumatic.     Mouth/Throat: Uvula is midline and oropharynx is clear and moist.       Edentulous  Eyes: Conjunctivae normal and EOM are normal.  Cardiovascular: An irregularly irregular rhythm present. Tachycardia present.   Pulmonary/Chest: Effort normal and breath sounds normal. No stridor. No respiratory distress.  Abdominal: She exhibits no distension.  Musculoskeletal: She exhibits no edema.       Arms: Neurological: She is alert and oriented to person, place, and time. No cranial nerve deficit.  Skin: Skin is warm and dry.  Psychiatric: She has a normal mood and affect.    ED Course  Procedures (including critical care time)   Labs Reviewed  CBC WITH DIFFERENTIAL  COMPREHENSIVE METABOLIC PANEL  LIPASE, BLOOD  TROPONIN I  PROTIME-INR  URINALYSIS, ROUTINE W REFLEX MICROSCOPIC   No results found.   No diagnosis found.  Cardiac: 63 year regular abnormal Pulse ox 98% nasal cannula abnormal    Date: 02/19/2012  Rate: 160  Rhythm: atrial fibrillation  QRS Axis: left  Intervals: afib  ST/T Wave abnormalities: nonspecific ST/T changes  Conduction Disutrbances:left anterior fascicular block  Narrative Interpretation:   Old EKG Reviewed: changes noted  Faster rate, abnormal  Immediately after initial eval interventions ordered  11:20 AM Family is here.  They report that the patient's Sx began approximately three days ago.  The pain in the face is new within 24 hours. On re-exam the patient has no malocclusion.  11:48 AM K low, and trop elevated.  IV access still not obtained.  HR <100  2:23 PM Case discussed with cardiology.  The patient has been receiving his mouth, with significant decrease in her heart rate, to less than 100.  MDM  This elderly female with multiple medical problems presents with new weakness, as well as facial pain and a lesion.  On exam the patient is tachycardic, though not hypotensive initially.  With her history of A. fib, this  Heart rate,  she required esmolol for conversion.  Notably, the patient had an elevated troponin as well as elevated BNP.  She also had hypokalemia.  The patient received IV potassium, while being resuscitated with esmolol.  She improved substantially, but given the complexity of her condition, she required admission for further evaluation and management.   CRITICAL CARE Performed by: Gerhard Munch   Total critical care time: 35  Critical care time was exclusive of separately billable procedures and treating other patients.  Critical care was necessary to treat or prevent imminent or life-threatening deterioration.  Critical care was time spent personally by me on the following activities: development of treatment plan with patient and/or surrogate as well as nursing, discussions with consultants, evaluation of patient's response to treatment, examination of patient, obtaining history from patient or surrogate, ordering and performing treatments and interventions, ordering and review of laboratory studies, ordering and review of radiographic studies, pulse oximetry and re-evaluation of patient's condition.   Gerhard Munch, MD 02/19/12 1425

## 2012-02-19 NOTE — ED Notes (Signed)
2 RNs attempted to start IV without success. IV team paged awaiting return phone call.

## 2012-02-19 NOTE — ED Notes (Addendum)
Per [facility]- pt has been having increased generalized weakness over the 3 days. Denies any recent sickness. Pt also has a knot on the right side of her neck that appeared last night and is painful. Pt also has a knot on buttocks that appeared over a week ago and is painful as well. bp 200/106. Pt took her BP medications prior to PT[facility] arrival. Pt reported to be in A. Fib with hr in the 120-160 with [facility]. Marland Kitchen

## 2012-02-19 NOTE — ED Notes (Signed)
Judeth Porch, pts daughter  [phone]

## 2012-02-19 NOTE — Progress Notes (Signed)
ANTICOAGULATION CONSULT NOTE - Initial Consult  Pharmacy Consult for Coumadin Indication: atrial fibrillation  Allergies  Allergen Reactions  . Nifedipine     REACTION: made her weak    Patient Measurements: Weight: 138 lb 14.2 oz (63 kg)   Vital Signs: Temp: 98.1 F (36.7 C) (01/31 1428) Temp src: Oral (01/31 1010) BP: 139/89 mmHg (01/31 1145) Pulse Rate: 87  (01/31 1145)  Labs:  Basename 02/19/12 1050  HGB 11.5*  HCT 33.4*  PLT 267  APTT --  LABPROT 22.5*  INR 2.08*  HEPARINUNFRC --  CREATININE 1.05  CKTOTAL --  CKMB --  TROPONINI 0.49*    The CrCl is unknown because both a height and weight (above a minimum accepted value) are required for this calculation.   Medical History: Past Medical History  Diagnosis Date  . Atrial fibrillation     permanent  . Atrial fibrillation with controlled ventricular response   . Aortic stenosis, moderate   . Diabetes mellitus     type 2  . Stroke     left sided residual  . CHF (congestive heart failure)   . Hypertension   . Gout     Medications:  See electronic med rec  Assessment: 79 y.o. female presents with weakness. On coumadin PTA for PAF. Baseline INR therapeutic. Home dose of coumadin 5mg  daily - last dose 1/30.   Goal of Therapy:  INR 2-3 Monitor platelets by anticoagulation protocol: Yes   Plan:  1. Daily INR 2. Coumadin 5mg  po daily  Christoper Fabian, PharmD, BCPS Clinical pharmacist, pager [phone] 02/19/2012,3:15 PM

## 2012-02-19 NOTE — H&P (Signed)
History and Physical   Patient ID: Veronica Powers MRN: [id number], DOB/AGE: 09/03/1925   Admit date: 02/19/2012 Date of Consult: 02/19/2012   Primary Physician: Laurena Slimmer, MD Primary Cardiologist: Berton Mount, MD (EP)  HPI: Veronica Powers is a 79 y.o. female with PMHx s/f PAF (on chronic Coumadin anticoagulation), h/o moderate AS, chronic diastolic CHF, h/o CVA and HTN who presents to [facility] ED today c/o weakness. EKG and telemetry there revealed atrial fibrillation with RVR.   Dr. Graciela Husbands saw the patient in 09/2011 for junctional bradycardia in the setting of PAF. Lopressor was held. ACEi added. Hydralazine increased. On follow-up, bradycardia had improved to NSR.    2D echo 10/2008: LVEF 60%, mild MR, mild AS, mild-mod AI, moderate biatrial elargement, RV systolic function WNL  She reports experiencing generalized malaise, weakness and nausea x 3 days. Last night, she developed sudden R facial swelling. She denies chest pain, shortness of breath, PND, orthopnea, new cough, LE edema, palpitations, lightheadedness and syncope. Denies n/v/d, fevers, chills, urinary changes. No active bleeding- denies hematochezia, melena, hemoptysis, hematemesis. She reports baseline DOE, and ambulates with a cane. She endorses decreased appetite and ~ 40 lbs weight loss since last summer. No difficulty swallowing, no sore throat. Minimal facial swelling, no redness or calor. Minimally tender.  No prior cardiac caths. Normal stress test several years ago.   In the ED, EKG revealed atrial fibrillation with RVR. Initial trop-I returned elevated at 0.49. pBNP 1951. She is hypokalemic at 2.4. Renal function preserved. LFTs WNL. CBC reveals low, but stable H&H. WBC and PLT WNL. U/a  With + small amt of leuks. CXR reveals chronic mild cardiomegaly and no acute disease. She was supplemented with K-Dur 10 mEq x 6 and started on an esmolol gtt.   Problem List: Past Medical History  Diagnosis Date  . Atrial  fibrillation     permanent  . Atrial fibrillation with controlled ventricular response   . Aortic stenosis, moderate   . Diabetes mellitus     type 2  . Stroke     left sided residual  . CHF (congestive heart failure)   . Hypertension   . Gout     History reviewed. No pertinent past surgical history.   Allergies:  Allergies  Allergen Reactions  . Nifedipine     REACTION: made her weak    Home Medications: Prior to Admission medications   Medication Sig Start Date End Date Taking? Authorizing Provider  allopurinol (ZYLOPRIM) 100 MG tablet Take 100 mg by mouth 2 (two) times daily.    Yes Historical Provider, MD  atorvastatin (LIPITOR) 20 MG tablet Take 20 mg by mouth daily.     Yes Historical Provider, MD  benazepril (LOTENSIN) 20 MG tablet Take 20 mg by mouth daily. 10/02/11 10/01/12 Yes Duke Salvia, MD  bimatoprost (LUMIGAN) 0.03 % ophthalmic solution Place 1 drop into both eyes at bedtime.     Yes Historical Provider, MD  brimonidine (ALPHAGAN) 0.15 % ophthalmic solution Place 1 drop into both eyes 2 (two) times daily.     Yes Historical Provider, MD  brinzolamide (AZOPT) 1 % ophthalmic suspension Place 1 drop into both eyes 2 (two) times daily.     Yes Historical Provider, MD  carboxymethylcellulose (REFRESH PLUS) 0.5 % SOLN Place 1 drop into both eyes as needed.     Yes Historical Provider, MD  COLCRYS 0.6 MG tablet Take 0.6 mg by mouth 2 (two) times daily. prn 09/14/11  Yes Historical Provider, MD  fluticasone (FLONASE) 50 MCG/ACT nasal spray Place 2 sprays into the nose daily. prn 09/02/11  Yes Historical Provider, MD  furosemide (LASIX) 40 MG tablet Take 40 mg by mouth daily.    Yes Historical Provider, MD  guanFACINE (TENEX) 2 MG tablet Take 1 mg by mouth at bedtime.    Yes Historical Provider, MD  hydrALAZINE (APRESOLINE) 10 MG tablet Take 20 mg by mouth 2 (two) times daily.   Yes Historical Provider, MD  indomethacin (INDOCIN) 25 MG capsule Take 25-50 mg by mouth 2 (two)  times daily with a meal. For gout flare ups   Yes Historical Provider, MD  isosorbide-hydrALAZINE (BIDIL) 20-37.5 MG per tablet Take 1 tablet by mouth 2 (two) times daily.   Yes Historical Provider, MD  oxybutynin (DITROPAN-XL) 10 MG 24 hr tablet Take 10 mg by mouth daily as needed. For bladder control   Yes Historical Provider, MD  pilocarpine (PILOCAR) 4 % ophthalmic solution Place 1 drop into both eyes 4 (four) times daily.     Yes Historical Provider, MD  warfarin (COUMADIN) 5 MG tablet Take 5 mg by mouth daily.   Yes Historical Provider, MD    Inpatient Medications:     (Not in a hospital admission)  Family History  Problem Relation Age of Onset  . Coronary artery disease    . Cancer       History   Social History  . Marital Status: Widowed    Spouse Name: N/A    Number of Children: N/A  . Years of Education: N/A   Occupational History  . RETIRED    Social History Main Topics  . Smoking status: Never Smoker   . Smokeless tobacco: Never Used  . Alcohol Use: No  . Drug Use: No  . Sexually Active: Not on file   Other Topics Concern  . Not on file   Social History Narrative  . No narrative on file     Review of Systems: General: positive malaise, weakness, negative for chills, fever, night sweats or weight changes.  Cardiovascular: negative for chest pain, dyspnea on exertion, edema, orthopnea, palpitations, paroxysmal nocturnal dyspnea or shortness of breath Dermatological: negative for rash Respiratory: negative for cough or wheezing Urologic: negative for hematuria Abdominal: positive nausea, negative for vomiting, diarrhea, bright red blood per rectum, melena, or hematemesis Neurologic: negative for visual changes, syncope, or dizziness All other systems reviewed and are otherwise negative except as noted above.  Physical Exam: Blood pressure 139/89, pulse 87, temperature 99.4 F (37.4 C), temperature source Oral, resp. rate 13, weight 63 kg (138 lb 14.2  oz), SpO2 99.00%.    General: Well developed, elderly, well nourished, in no acute distress. Head/Neck: Normocephalic, R indurated facial lesion, nonfluctuant, minimally mobile, minimally tender, non erythematous, no calor, sclera non-icteric, no xanthomas, nares are without discharge. Negative for carotid bruits. JVD not elevated. Lungs: Clear bilaterally to auscultation without wheezes, rales, or rhonchi. Breathing is unlabored. Heart: Tachycardic, irregular, II/VI systolic crescendo-drescendo murmur, with S1 S2. No murmurs, rubs, or gallops appreciated. Abdomen: Soft, non-tender, non-distended with normoactive bowel sounds. No hepatomegaly. No rebound/guarding. No obvious abdominal masses. Msk:  Strength and tone appears normal for age. Extremities: No clubbing, cyanosis or edema.  Distal pedal pulses are 2+ and equal bilaterally. Neuro: Alert and oriented X 3. Moves all extremities spontaneously. Psych:  Responds to questions appropriately with a normal affect.  Labs: Recent Labs  Basename 02/19/12 1050   WBC 10.2   HGB 11.5*   HCT  33.4*   MCV 86.8   PLT 267   Lab 02/19/12 1050  NA 135  K 2.4*  CL 94*  CO2 24  BUN 21  CREATININE 1.05  CALCIUM 9.5  PROT 7.8  BILITOT 1.2  ALKPHOS 108  ALT 29  AST 35  AMYLASE --  LIPASE 12  GLUCOSE 134*   Recent Labs  Basename 02/19/12 1050   CKTOTAL --   CKMB --   CKMBINDEX --   TROPONINI 0.49*   Radiology/Studies: Dg Chest 2 View  02/19/2012  *RADIOLOGY REPORT*  Clinical Data: Cough, shortness of breath, and chest discomfort.  CHEST - 2 VIEW  Comparison: 10/19/2011 and multiple other chest x-rays dating back to 06/02/2006  Findings: Chronic mild cardiomegaly.  Main pulmonary arteries are chronically slightly prominent.  No infiltrates or effusions.  No acute osseous abnormality.  IMPRESSION: No acute disease.  Chronic mild cardiomegaly.   Original Report Authenticated By: Francene Boyers, M.D.    EKG: atrial fibrillation with RVR, 160  bpm, LAD, borderline LVH; no ST/T changes  ASSESSMENT:   79 y.o. female with PMHx s/f PAF (on chronic Coumadin anticoagulation), h/o moderate AS, chronic diastolic CHF, h/o CVA and HTN who presents to [facility] ED today c/o weakness. EKG and telemetry there revealed atrial fibrillation with RVR.   1. Atrial fibrillation with RVR 2. History of junctional bradycardia 3. Moderate AS 4. Acute on chronic diastolic CHF 5. H/o CVA 7. HTN 8. Facial swelling 9. Hypokalemia 10. ? UTI 11. Weight loss  DISCUSSION/PLAN:  Patient with a history of PAF, junctional bradycardia and chronic diastolic CHF presents to [facility] ED today after 3 days of weakness and new facial swelling. She denies overt ischemic, arrhythmic or CHF-type symptoms. Weakness and malaise could be attributed to hypokalemia which would drive paroxysm of a-fib. Question underlying infectious/inflammatory process accounting for facial swelling, and thus potentiating a-fib also. She is afebrile without a leukocytosis, but also elderly and may not mount a large immune response. Facial mass is mildly tender, non erythemetous and occurred acutely overnight ? Parotiditis, sialoadenitis. Will request ENT consult. Will correct hypokalemia and obtain urine culture.   Has recent history of bradycardia for which Lopressor was discontinued. If remains in a-fib despite aforementioned corrections, may need to consider pacemaker placement given need for rate-control and h/o junctional bradycardia (tachy-brady). Continue Coumadin. Regarding elevated TnI, suspect demand ischemia as she denies anginal symptoms. Will trend markers and follow clinically. Continue esmolol gtt for rate-control. Otherwise, euvolemic on exam. Correct potassium before resuming Lasix. Resume outpatient dosing once corrected. Given CHF history, hold off on over-hydrating. Supplement K with PO over IV formulations. Will need to review swallow eval before transitioning to PO. Repeat  2D echo. With weight loss, will check TSH and free T4.  Additional exacerbating factors of a-fib including thyroid abnormalities or progression of AS. Will request EP consult.  Signed, R. Hurman Horn, PA-C 02/19/2012, 1:16 PM  The patient was seen in the emergency room.  She came to the emergency room because of concern over the sudden painful right facial swelling in the area of the right parotid gland.  She went to bed last night feeling okay and woke up this morning with marked swelling and tenderness.  He does not have any leukocytosis and she is afebrile.  She herself was unaware of her rapid irregular heart rate.  As noted she does have a past history of paroxysmal atrial fibrillation and when last seen in the office by Dr. Graciela Husbands  on September 2013 she had a junctional bradycardia which corrected itself when Lopressor was held.  As a history of hypertension and an ACE inhibitor was added and her hydralazine was increased that time.  He does have a history of chronic diastolic heart failure and has been on Lasix.  She has been feeling weaker for the past 3 days and on arrival today she was found to be markedly hypokalemic with potassium 2.4 presumably from Lasix.  She has not been having any diarrhea.  She has had a 40 pound weight loss in the past year despite normal appetite according to her daughter.  He does not have any history of hyperthyroidism and her last thyroid function studies were in 2012 and we will update a free T4 and a TSH to rule out apathetic hyperthyroidism of the elderly.  She was started on esmolol intravenously in the emergency room and has had a good clinical response with slowing of her ventricular response and we will plan to continue esmolol overnight and then switch to oral beta blocker tomorrow.  Given her past history of junctional bradycardia she may have aspects of sick sinus syndrome with tachybradycardia syndrome and may eventually need a permanent pacemaker.  We will ask  EP to see her.  We'll update her echocardiogram.  In regard to her presenting symptom of painful swelling of the right side of her face possible acute  parotitis I have called Dr. Lucky Rathke office for ENT consult.  The patient is presently therapeutic on her Coumadin with an INR of 2.08 for her atrial fibrillation.  She has a history of a prior stroke.  She has a slightly elevated troponin felt to be secondary to demand ischemia from her rapid atrial fibrillation.  Her pro BNP is mildly elevated but clinically and by chest x-ray she does not appear to be in heart failure.  She is mildly anemic and we will get a anemia panel especially in view of her weight loss.  Agree with the assessment and plan as outlined by Mr. Arguello, PA-C.

## 2012-02-19 NOTE — Consult Note (Signed)
Reason for Consult: Facial swelling Referring Physician: Cassell Clement, MD  Veronica Powers is an 79 y.o. female.  HPI: Admitted today from the emergency department for cardiac abnormality. She woke up this morning with right facial swelling. She denies any pain or any fluctuation. She's never had problems like this before. When she ate today he didn't get any worse.  Past Medical History  Diagnosis Date  . Atrial fibrillation     permanent  . Atrial fibrillation with controlled ventricular response   . Aortic stenosis, moderate   . Diabetes mellitus     type 2  . Stroke     left sided residual  . CHF (congestive heart failure)   . Hypertension   . Gout   . Anginal pain   . Shortness of breath   . GERD (gastroesophageal reflux disease)     History reviewed. No pertinent past surgical history.  Family History  Problem Relation Age of Onset  . Coronary artery disease    . Cancer      Social History:  reports that she has never smoked. She has never used smokeless tobacco. She reports that she does not drink alcohol or use illicit drugs.  Allergies:  Allergies  Allergen Reactions  . Nifedipine     REACTION: made her weak    Medications: Reviewed  Results for orders placed during the hospital encounter of 02/19/12 (from the past 48 hour(s))  CBC WITH DIFFERENTIAL     Status: Abnormal   Collection Time   02/19/12 10:50 AM      Component Value Range Comment   WBC 10.2  4.0 - 10.5 K/uL    RBC 3.85 (*) 3.87 - 5.11 MIL/uL    Hemoglobin 11.5 (*) 12.0 - 15.0 g/dL    HCT 40.9 (*) 81.1 - 46.0 %    MCV 86.8  78.0 - 100.0 fL    MCH 29.9  26.0 - 34.0 pg    MCHC 34.4  30.0 - 36.0 g/dL    RDW 91.4  78.2 - 95.6 %    Platelets 267  150 - 400 K/uL    Neutrophils Relative 81 (*) 43 - 77 %    Neutro Abs 8.3 (*) 1.7 - 7.7 K/uL    Lymphocytes Relative 12  12 - 46 %    Lymphs Abs 1.2  0.7 - 4.0 K/uL    Monocytes Relative 7  3 - 12 %    Monocytes Absolute 0.7  0.1 - 1.0 K/uL     Eosinophils Relative 0  0 - 5 %    Eosinophils Absolute 0.0  0.0 - 0.7 K/uL    Basophils Relative 0  0 - 1 %    Basophils Absolute 0.0  0.0 - 0.1 K/uL   COMPREHENSIVE METABOLIC PANEL     Status: Abnormal   Collection Time   02/19/12 10:50 AM      Component Value Range Comment   Sodium 135  135 - 145 mEq/L    Potassium 2.4 (*) 3.5 - 5.1 mEq/L    Chloride 94 (*) 96 - 112 mEq/L    CO2 24  19 - 32 mEq/L    Glucose, Bld 134 (*) 70 - 99 mg/dL    BUN 21  6 - 23 mg/dL    Creatinine, Ser 2.13  0.50 - 1.10 mg/dL    Calcium 9.5  8.4 - 08.6 mg/dL    Total Protein 7.8  6.0 - 8.3 g/dL    Albumin 3.5  3.5 - 5.2 g/dL    AST 35  0 - 37 U/L    ALT 29  0 - 35 U/L    Alkaline Phosphatase 108  39 - 117 U/L    Total Bilirubin 1.2  0.3 - 1.2 mg/dL    GFR calc non Af Amer 47 (*) >90 mL/min    GFR calc Af Amer 55 (*) >90 mL/min   LIPASE, BLOOD     Status: Normal   Collection Time   02/19/12 10:50 AM      Component Value Range Comment   Lipase 12  11 - 59 U/L   TROPONIN I     Status: Abnormal   Collection Time   02/19/12 10:50 AM      Component Value Range Comment   Troponin I 0.49 (*) <0.30 ng/mL   PROTIME-INR     Status: Abnormal   Collection Time   02/19/12 10:50 AM      Component Value Range Comment   Prothrombin Time 22.5 (*) 11.6 - 15.2 seconds    INR 2.08 (*) 0.00 - 1.49   PRO B NATRIURETIC PEPTIDE     Status: Abnormal   Collection Time   02/19/12 10:50 AM      Component Value Range Comment   Pro B Natriuretic peptide (BNP) 1951.0 (*) 0 - 450 pg/mL   URINALYSIS, ROUTINE W REFLEX MICROSCOPIC     Status: Abnormal   Collection Time   02/19/12 11:26 AM      Component Value Range Comment   Color, Urine YELLOW  YELLOW    APPearance CLEAR  CLEAR    Specific Gravity, Urine 1.013  1.005 - 1.030    pH 5.0  5.0 - 8.0    Glucose, UA NEGATIVE  NEGATIVE mg/dL    Hgb urine dipstick NEGATIVE  NEGATIVE    Bilirubin Urine NEGATIVE  NEGATIVE    Ketones, ur 15 (*) NEGATIVE mg/dL    Protein, ur NEGATIVE   NEGATIVE mg/dL    Urobilinogen, UA 0.2  0.0 - 1.0 mg/dL    Nitrite NEGATIVE  NEGATIVE    Leukocytes, UA SMALL (*) NEGATIVE   URINE MICROSCOPIC-ADD ON     Status: Normal   Collection Time   02/19/12 11:26 AM      Component Value Range Comment   Squamous Epithelial / LPF RARE  RARE    WBC, UA 3-6  <3 WBC/hpf    Bacteria, UA RARE  RARE   MAGNESIUM     Status: Normal   Collection Time   02/19/12  3:39 PM      Component Value Range Comment   Magnesium 2.0  1.5 - 2.5 mg/dL   TROPONIN I     Status: Abnormal   Collection Time   02/19/12  3:40 PM      Component Value Range Comment   Troponin I 0.43 (*) <0.30 ng/mL     Dg Chest 2 View  02/19/2012  *RADIOLOGY REPORT*  Clinical Data: Cough, shortness of breath, and chest discomfort.  CHEST - 2 VIEW  Comparison: 10/19/2011 and multiple other chest x-rays dating back to 06/02/2006  Findings: Chronic mild cardiomegaly.  Main pulmonary arteries are chronically slightly prominent.  No infiltrates or effusions.  No acute osseous abnormality.  IMPRESSION: No acute disease.  Chronic mild cardiomegaly.   Original Report Authenticated By: Francene Boyers, M.D.     [id number] except as listed in admit H&P  Blood pressure 168/105, pulse 113, temperature 97.1 F (36.2 C), temperature  source Oral, resp. rate 17, height 4\' 11"  (1.499 m), weight 126 lb 15.8 oz (57.6 kg), SpO2 99.00%.  PHYSICAL EXAM: Overall appearance:  Healthy appearing, in no distress Head:  Normocephalic, atraumatic. Ears: External auditory ears are normal. Nose: External nose is healthy in appearance. Internal nasal exam free of any lesions or obstruction. Oral Cavity:  There are no mucosal lesions or masses identified. Mucous membranes dry. Dentition in poor repair. No visible or palpable lesion along the right parotid the puncta. Neuro:  No identifiable neurologic deficits. Neck: Firm nontender swelling right parotid gland. It is relatively soft.  Studies Reviewed:  none  Procedures: none   Assessment/Plan: Acute right parotid sialoadenitis. Recommend increase fluid intake, heating pad on the gland, massage the gland, and this should respond. Hold off on antibiotics unless it gets worse.  Veronica Powers 02/19/2012, 5:46 PM

## 2012-02-20 ENCOUNTER — Encounter (HOSPITAL_COMMUNITY): Payer: Self-pay | Admitting: Neurology

## 2012-02-20 DIAGNOSIS — I4891 Unspecified atrial fibrillation: Secondary | ICD-10-CM

## 2012-02-20 DIAGNOSIS — R220 Localized swelling, mass and lump, head: Secondary | ICD-10-CM

## 2012-02-20 LAB — CBC
HCT: 31.2 % — ABNORMAL LOW (ref 36.0–46.0)
Platelets: 253 10*3/uL (ref 150–400)
RBC: 3.59 MIL/uL — ABNORMAL LOW (ref 3.87–5.11)
RDW: 15.3 % (ref 11.5–15.5)
WBC: 11 10*3/uL — ABNORMAL HIGH (ref 4.0–10.5)

## 2012-02-20 LAB — FERRITIN: Ferritin: 271 ng/mL (ref 10–291)

## 2012-02-20 LAB — HEMOGLOBIN A1C
Hgb A1c MFr Bld: 6.6 % — ABNORMAL HIGH (ref <5.7)
Mean Plasma Glucose: 143 mg/dL — ABNORMAL HIGH (ref <117)

## 2012-02-20 LAB — LIPID PANEL
Cholesterol: 101 mg/dL (ref 0–200)
Triglycerides: 96 mg/dL (ref <150)
VLDL: 19 mg/dL (ref 0–40)

## 2012-02-20 LAB — RETICULOCYTES: Retic Count, Absolute: 37.1 10*3/uL (ref 19.0–186.0)

## 2012-02-20 LAB — BASIC METABOLIC PANEL
Chloride: 96 mEq/L (ref 96–112)
Creatinine, Ser: 1.1 mg/dL (ref 0.50–1.10)
GFR calc Af Amer: 52 mL/min — ABNORMAL LOW (ref >90)
Sodium: 132 mEq/L — ABNORMAL LOW (ref 135–145)

## 2012-02-20 LAB — IRON AND TIBC: Iron: 16 ug/dL — ABNORMAL LOW (ref 42–135)

## 2012-02-20 LAB — T4, FREE: Free T4: 1.31 ng/dL (ref 0.80–1.80)

## 2012-02-20 LAB — FOLATE: Folate: >20 ng/mL

## 2012-02-20 MED ORDER — METOPROLOL TARTRATE 25 MG PO TABS
25.0000 mg | ORAL_TABLET | Freq: Four times a day (QID) | ORAL | Status: DC
  Administered 2012-02-20 – 2012-02-21 (×5): 25 mg via ORAL
  Filled 2012-02-20 (×10): qty 1

## 2012-02-20 MED ORDER — GUANFACINE HCL 2 MG PO TABS
2.0000 mg | ORAL_TABLET | Freq: Every day | ORAL | Status: DC
  Filled 2012-02-20 (×2): qty 1

## 2012-02-20 MED ORDER — GUANFACINE HCL ER 2 MG PO TB24
2.0000 mg | ORAL_TABLET | Freq: Every day | ORAL | Status: DC
  Administered 2012-02-20 – 2012-02-21 (×2): 2 mg via ORAL
  Filled 2012-02-20 (×3): qty 1

## 2012-02-20 NOTE — Progress Notes (Signed)
Patient ID: Veronica Powers, female   DOB: [birth year], 79 y.o.   MRN: [id number]    SUBJECTIVE: HR controlled in the 70s this am, atrial fibrillation.  Still with painful right facial swelling.      Marland Kitchen allopurinol  100 mg Oral BID  . atorvastatin  20 mg Oral Daily  . benazepril  20 mg Oral Daily  . colchicine  0.6 mg Oral BID  . furosemide  40 mg Oral Daily  . guanFACINE  2 mg Oral QHS  . isosorbide-hydrALAZINE  1 tablet Oral BID  . metoprolol tartrate  25 mg Oral Q6H  . sodium chloride  3 mL Intravenous Q12H  . warfarin  5 mg Oral q1800  . Warfarin - Pharmacist Dosing Inpatient   Does not apply q1800      Filed Vitals:   02/20/12 0400 02/20/12 0401 02/20/12 0500 02/20/12 0600  BP: 119/88   109/86  Pulse: 68   67  Temp:  98.4 F (36.9 C)    TempSrc:  Oral    Resp: 18     Height:      Weight:   128 lb 12 oz (58.4 kg)   SpO2: 98%   98%    Intake/Output Summary (Last 24 hours) at 02/20/12 0749 Last data filed at 02/20/12 0600  Gross per 24 hour  Intake 278.53 ml  Output    250 ml  Net  28.53 ml    LABS: Basic Metabolic Panel:  Basename 02/20/12 0039 02/19/12 2300 02/19/12 1539 02/19/12 1050  NA 132* -- -- 135  K 3.5 3.5 -- --  CL 96 -- -- 94*  CO2 25 -- -- 24  GLUCOSE 122* -- -- 134*  BUN 21 -- -- 21  CREATININE 1.10 -- -- 1.05  CALCIUM 9.0 -- -- 9.5  MG -- -- 2.0 --  PHOS -- -- -- --   Liver Function Tests:  Basename 02/19/12 1050  AST 35  ALT 29  ALKPHOS 108  BILITOT 1.2  PROT 7.8  ALBUMIN 3.5    Basename 02/19/12 1050  LIPASE 12  AMYLASE --   CBC:  Basename 02/20/12 0039 02/19/12 1050  WBC 11.0* 10.2  NEUTROABS -- 8.3*  HGB 10.7* 11.5*  HCT 31.2* 33.4*  MCV 86.9 86.8  PLT 253 267   Cardiac Enzymes:  Basename 02/20/12 0038 02/19/12 1540 02/19/12 1050  CKTOTAL -- -- --  CKMB -- -- --  CKMBINDEX -- -- --  TROPONINI 0.48* 0.43* 0.49*   BNP: No components found with this basename: POCBNP:3 D-Dimer: No results found for this  basename: DDIMER:2 in the last 72 hours Hemoglobin A1C:  Basename 02/19/12 1539  HGBA1C 6.6*   Fasting Lipid Panel:  Basename 02/20/12 0039  CHOL 101  HDL 42  LDLCALC 40  TRIG 96  CHOLHDL 2.4  LDLDIRECT --   Thyroid Function Tests:  Basename 02/19/12 1539  TSH 1.207  T4TOTAL --  T3FREE --  THYROIDAB --   Anemia Panel:  Basename 02/20/12 0102  VITAMINB12 --  FOLATE --  FERRITIN --  TIBC --  IRON --  RETICCTPCT 1.0    RADIOLOGY: Dg Chest 2 View  02/19/2012  *RADIOLOGY REPORT*  Clinical Data: Cough, shortness of breath, and chest discomfort.  CHEST - 2 VIEW  Comparison: 10/19/2011 and multiple other chest x-rays dating back to 06/02/2006  Findings: Chronic mild cardiomegaly.  Main pulmonary arteries are chronically slightly prominent.  No infiltrates or effusions.  No acute osseous abnormality.  IMPRESSION: No acute  disease.  Chronic mild cardiomegaly.   Original Report Authenticated By: Francene Boyers, M.D.     PHYSICAL EXAM General: NAD HEENT: Tender swelling right parotid area Neck: No JVD, no thyromegaly or thyroid nodule.  Lungs: Dry crackles at bases bilaterally. CV: Nondisplaced PMI.  Heart irregular S1/S2, no S3/S4, 2/6 SEM.  No peripheral edema.   Abdomen: Soft, nontender, no hepatosplenomegaly, no distention.  Neurologic: Alert and oriented x 3.  Psych: Normal affect. Extremities: No clubbing or cyanosis.   TELEMETRY: Reviewed telemetry pt in atrial fibrillation, HR 70s  ASSESSMENT AND PLAN:  79 yo with history of PAF, diastolic CHF, mild to moderate AS, probable tachy-brady syndrome presented with atrial fibrillation/RVR in the setting of right parotid sialoadenitis.  1. Atrial fibrillation: Rate now controlled on esmolol gtt.  Likely triggered by pain from sialoadenitis.  She has been therapeutic on warfarin.  - d/c esmolol, transition onto metoprolol 25 mg every 6 hours - Follow telemetry.  She has had junctional bradycardia on metoprolol in the  past, likely has tachy-brady syndrome.  She may end up needing a PCM.  - Echocardiogram 2. Chronic diastolic CHF: Stable volume, continue po Lasix.  3. Right parotid sialoadenitis: Mild WBC elevation.  Continue supportive care with massage and heating pad as per ENT recommendations.  4. HTN: BP controlled on home regimen.  5. May go to telemetry floor.   Marca Ancona 02/20/2012 7:54 AM

## 2012-02-20 NOTE — Progress Notes (Signed)
ANTICOAGULATION CONSULT NOTE - Follow Up Consult  Pharmacy Consult for Coumadin Indication: atrial fibrillation  Allergies  Allergen Reactions  . Nifedipine     REACTION: made her weak    Patient Measurements: Height: 4\' 11"  (149.9 cm) Weight: 128 lb 12 oz (58.4 kg) IBW/kg (Calculated) : 43.2   Vital Signs: Temp: 98.2 F (36.8 C) (02/01 1156) Temp src: Oral (02/01 1156) BP: 102/45 mmHg (02/01 1200) Pulse Rate: 71  (02/01 1200)  Labs:  Basename 02/20/12 0039 02/20/12 0038 02/19/12 1540 02/19/12 1050  HGB 10.7* -- -- 11.5*  HCT 31.2* -- -- 33.4*  PLT 253 -- -- 267  APTT -- -- -- --  LABPROT 24.3* -- -- 22.5*  INR 2.30* -- -- 2.08*  HEPARINUNFRC -- -- -- --  CREATININE 1.10 -- -- 1.05  CKTOTAL -- -- -- --  CKMB -- -- -- --  TROPONINI -- 0.48* 0.43* 0.49*    Estimated Creatinine Clearance: 29.6 ml/min (by C-G formula based on Cr of 1.1).   Medications:  Scheduled:    . allopurinol  100 mg Oral BID  . atorvastatin  20 mg Oral Daily  . benazepril  20 mg Oral Daily  . colchicine  0.6 mg Oral BID  . furosemide  40 mg Oral Daily  . guanFACINE  2 mg Oral QHS  . isosorbide-hydrALAZINE  1 tablet Oral BID  . metoprolol tartrate  25 mg Oral Q6H  . [EXPIRED] potassium chloride  10 mEq Intravenous Q1 Hr x 6  . [COMPLETED] potassium chloride  40 mEq Oral Once  . sodium chloride  3 mL Intravenous Q12H  . warfarin  5 mg Oral q1800  . Warfarin - Pharmacist Dosing Inpatient   Does not apply q1800  . [DISCONTINUED] guanFACINE  1 mg Oral QHS  . [DISCONTINUED] guanFACINE  2 mg Oral QHS  . [COMPLETED] sodium chloride  500 mL Intravenous Once    Assessment: 79 y/o female on chronic coumadin for PAF. Baseline INR therapeutic. INR today is 2.3. Continue current dosing.  Goal of Therapy:  INR 2-3 Monitor platelets by anticoagulation protocol: Yes   Plan:  1. Daily INR  2. Coumadin 5mg  po daily   Lillia Pauls, PharmD Clinical Pharmacist Pager: [phone] Phone:  [phone] 02/20/2012 2:23 PM

## 2012-02-20 NOTE — Progress Notes (Signed)
  Echocardiogram 2D Echocardiogram has been performed.  Ellender Hose A 02/20/2012, 11:58 AM

## 2012-02-21 LAB — CBC
Hemoglobin: 11.6 g/dL — ABNORMAL LOW (ref 12.0–15.0)
MCH: 28.7 pg (ref 26.0–34.0)
MCV: 85.4 fL (ref 78.0–100.0)
RBC: 4.04 MIL/uL (ref 3.87–5.11)

## 2012-02-21 LAB — BASIC METABOLIC PANEL
BUN: 18 mg/dL (ref 6–23)
CO2: 23 mEq/L (ref 19–32)
Chloride: 94 mEq/L — ABNORMAL LOW (ref 96–112)
Creatinine, Ser: 0.88 mg/dL (ref 0.50–1.10)

## 2012-02-21 MED ORDER — TRAMADOL HCL 50 MG PO TABS
50.0000 mg | ORAL_TABLET | Freq: Four times a day (QID) | ORAL | Status: DC | PRN
  Administered 2012-02-21: 100 mg via ORAL
  Administered 2012-02-22: 50 mg via ORAL
  Filled 2012-02-21: qty 1
  Filled 2012-02-21: qty 2

## 2012-02-21 MED ORDER — MUPIROCIN 2 % EX OINT
1.0000 "application " | TOPICAL_OINTMENT | Freq: Two times a day (BID) | CUTANEOUS | Status: DC
  Administered 2012-02-21 – 2012-02-22 (×2): 1 via NASAL
  Filled 2012-02-21: qty 22

## 2012-02-21 MED ORDER — CHLORHEXIDINE GLUCONATE CLOTH 2 % EX PADS
6.0000 | MEDICATED_PAD | Freq: Every day | CUTANEOUS | Status: DC
  Administered 2012-02-22: 6 via TOPICAL

## 2012-02-21 MED ORDER — AMOXICILLIN-POT CLAVULANATE 875-125 MG PO TABS
1.0000 | ORAL_TABLET | Freq: Two times a day (BID) | ORAL | Status: DC
  Administered 2012-02-21 – 2012-02-22 (×2): 1 via ORAL
  Filled 2012-02-21 (×3): qty 1

## 2012-02-21 MED ORDER — METOPROLOL TARTRATE 25 MG PO TABS
25.0000 mg | ORAL_TABLET | Freq: Three times a day (TID) | ORAL | Status: DC
  Administered 2012-02-21 – 2012-02-22 (×4): 25 mg via ORAL
  Filled 2012-02-21 (×5): qty 1

## 2012-02-21 NOTE — Progress Notes (Signed)
ANTICOAGULATION CONSULT NOTE - Follow Up Consult  Pharmacy Consult for Coumadin Indication: atrial fibrillation  Allergies  Allergen Reactions  . Nifedipine     REACTION: made her weak    Patient Measurements: Height: 4\' 11"  (149.9 cm) Weight: 139 lb 14.4 oz (63.458 kg) IBW/kg (Calculated) : 43.2   Vital Signs: Temp: 98.1 F (36.7 C) (02/02 0523) Temp src: Oral (02/02 0523) BP: 163/90 mmHg (02/02 0523) Pulse Rate: 96  (02/02 0523)  Labs:  Basename 02/21/12 1117 02/20/12 0039 02/20/12 0038 02/19/12 1540 02/19/12 1050  HGB 11.6* 10.7* -- -- --  HCT 34.5* 31.2* -- -- 33.4*  PLT 239 253 -- -- 267  APTT -- -- -- -- --  LABPROT 32.5* 24.3* -- -- 22.5*  INR 3.41* 2.30* -- -- 2.08*  HEPARINUNFRC -- -- -- -- --  CREATININE 0.88 1.10 -- -- 1.05  CKTOTAL -- -- -- -- --  CKMB -- -- -- -- --  TROPONINI -- -- 0.48* 0.43* 0.49*    Estimated Creatinine Clearance: 38.5 ml/min (by C-G formula based on Cr of 0.88).   Medications:  Scheduled:     . allopurinol  100 mg Oral BID  . atorvastatin  20 mg Oral Daily  . benazepril  20 mg Oral Daily  . colchicine  0.6 mg Oral BID  . furosemide  40 mg Oral Daily  . guanFACINE  2 mg Oral QHS  . isosorbide-hydrALAZINE  1 tablet Oral BID  . metoprolol tartrate  25 mg Oral TID  . sodium chloride  3 mL Intravenous Q12H  . warfarin  5 mg Oral q1800  . Warfarin - Pharmacist Dosing Inpatient   Does not apply q1800  . [DISCONTINUED] guanFACINE  2 mg Oral QHS  . [DISCONTINUED] metoprolol tartrate  25 mg Oral Q6H    Assessment: 79 y/o female on chronic coumadin for PAF. Coumadin home dose 5mg  daily was resumed on 1/31, INR increased to 3.4 from 2.3 this morning. Cbc stable, no bleeding per chart.  Goal of Therapy:  INR 2-3 Monitor platelets by anticoagulation protocol: Yes   Plan:  - Hold coumadin tonight - f/u INR in AM  Bayard Hugger, PharmD, BCPS  Clinical Pharmacist  Pager: [phone]   02/21/2012 12:53 PM

## 2012-02-21 NOTE — Progress Notes (Signed)
WBC mildly elevated, will start p.o. Abx.

## 2012-02-21 NOTE — Consult Note (Signed)
WOC consult Note Reason for Consult: evaluation of pressure ulcer. Bedside nursing has documented Stage II pressure ulcer on the coccyx.  Wound type: Stage II pressure ulcer Pressure Ulcer POA: Yes Measurement:0.5cm x0.5cm x0.2cm  Wound ZOX:WRUE pink, moist Drainage (amount, consistency, odor) none Periwound: intact Dressing procedure/placement/frequency: add silicone foam dressing to protect and insulate wound and encourage reepithelialization.  Turn every 2 hours.  May use zinc based barrier if dressing adherence becomes an issue due to location.  Re consult if needed, will not follow at this time. Thanks  Jaella Weinert [employer], CWOCN [phone])

## 2012-02-21 NOTE — Progress Notes (Signed)
Veronica Powers  79 y.o.  female  Subjective: Patient notes mild to moderate pain over the swelling on the right side of her neck; general malaise; has not been getting out of bed. Denies palpitations, lightheadedness and dyspnea.  Reports skin breakdown in the region of the buttocks  Allergy: Nifedipine  Objective: Vital signs in last 24 hours: Temp:  [98.1 F (36.7 C)-98.2 F (36.8 C)] 98.1 F (36.7 C) (02/02 0523) Pulse Rate:  [71-102] 96  (02/02 0523) Resp:  [18-21] 18  (02/02 0523) BP: (102-163)/(45-90) 163/90 mmHg (02/02 0523) SpO2:  [100 %] 100 % (02/02 0523) Weight:  [63.458 kg (139 lb 14.4 oz)] 63.458 kg (139 lb 14.4 oz) (02/02 0523)  63.458 kg (139 lb 14.4 oz) Body mass index is 28.26 kg/(m^2).  Weight change: 0.458 kg (1 lb 0.2 oz) Last BM Date: 02/19/12  General- Well developed; no acute distress; mildly overweight Neck- No JVD, no carotid bruits; firm mass over the right neck, slightly warm and mildly tender Lungs- clear lung fields; normal I:E ratio Cardiovascular- normal PMI; normal S1 and S2 Abdomen- normal bowel sounds; soft and non-tender without masses or organomegaly Skin- Warm, no significant lesions Extremities- Nl distal pulses; no edema  Lab Results: Cardiac Markers:   Basename 02/20/12 0038 02/19/12 1540  TROPONINI 0.48* 0.43*   CBC:   Basename 02/20/12 0039 02/19/12 1050  WBC 11.0* 10.2  HGB 10.7* 11.5*  HCT 31.2* 33.4*  PLT 253 267   BMET:  Basename 02/20/12 0039 02/19/12 2300 02/19/12 1050  NA 132* -- 135  K 3.5 3.5 --  CL 96 -- 94*  CO2 25 -- 24  GLUCOSE 122* -- 134*  BUN 21 -- 21  CREATININE 1.10 -- 1.05  CALCIUM 9.0 -- 9.5   Hepatic Function:   Basename 02/19/12 1050  PROT 7.8  ALBUMIN 3.5  AST 35  ALT 29  ALKPHOS 108  BILITOT 1.2  BILIDIR --  IBILI --   GFR:  Estimated Creatinine Clearance: 30.8 ml/min (by C-G formula based on Cr of 1.1). Lipids:   Basename 02/20/12 0039  CHOL 101  TRIG 96  HDL 42   EKG:  Atrial  fibrillation with controlled ventricular response; left anterior fascicular block; delayed R-wave progression; prolonged QT interval  Imaging Studies/Results: Dg Chest 2 View  02/19/2012  *RADIOLOGY REPORT*  Clinical Data: Cough, shortness of breath, and chest discomfort.  CHEST - 2 VIEW  Comparison: 10/19/2011 and multiple other chest x-rays dating back to 06/02/2006  Findings: Chronic mild cardiomegaly.  Main pulmonary arteries are chronically slightly prominent.  No infiltrates or effusions.  No acute osseous abnormality.  IMPRESSION: No acute disease.  Chronic mild cardiomegaly.   Original Report Authenticated By: Francene Boyers, M.D.    Imaging: Imaging results have been reviewed  Medications:  I have reviewed the patient's current medications. Scheduled:   . allopurinol  100 mg Oral BID  . atorvastatin  20 mg Oral Daily  . benazepril  20 mg Oral Daily  . colchicine  0.6 mg Oral BID  . furosemide  40 mg Oral Daily  . guanFACINE  2 mg Oral QHS  . isosorbide-hydrALAZINE  1 tablet Oral BID  . metoprolol tartrate  25 mg Oral Q6H  . sodium chloride  3 mL Intravenous Q12H  . warfarin  5 mg Oral q1800  . Warfarin - Pharmacist Dosing Inpatient   Does not apply q1800   Atrial fibrillation with RVR; heart rate is controlled with fairly low dose metoprolol; in light  of patient's history of bradycardia arrhythmias, dose of metoprolol will be decreased to a total of 75 mg per day. If heart rate remains controlled, further dose reduction would be appropriate.   Sialoadenitis: Dr. Lucky Rathke recommendations have been instituted; tramadol will be added for improved control of pain.  Chronic anticoagulation:  INR is therapeutic. Dosing directed by pharmacy.  Decubitus: Wound Consultation requested   Assessment/Plan:   LOS: 2 days   [place] Bing 02/21/2012, 10:53 AM

## 2012-02-22 LAB — PROTIME-INR
INR: 3.44 — ABNORMAL HIGH (ref 0.00–1.49)
Prothrombin Time: 32.7 seconds — ABNORMAL HIGH (ref 11.6–15.2)

## 2012-02-22 LAB — BASIC METABOLIC PANEL
BUN: 16 mg/dL (ref 6–23)
Creatinine, Ser: 0.89 mg/dL (ref 0.50–1.10)
GFR calc Af Amer: 67 mL/min — ABNORMAL LOW (ref >90)
GFR calc non Af Amer: 58 mL/min — ABNORMAL LOW (ref >90)

## 2012-02-22 MED ORDER — WARFARIN SODIUM 5 MG PO TABS: ORAL_TABLET | ORAL | Status: DC

## 2012-02-22 MED ORDER — AMOXICILLIN-POT CLAVULANATE 875-125 MG PO TABS: 1.0000 | ORAL_TABLET | Freq: Two times a day (BID) | ORAL | Status: DC

## 2012-02-22 MED ORDER — METOPROLOL TARTRATE 25 MG PO TABS: 25.0000 mg | ORAL_TABLET | Freq: Two times a day (BID) | ORAL | Status: DC

## 2012-02-22 MED ORDER — POTASSIUM CHLORIDE ER 10 MEQ PO TBCR: 20.0000 meq | EXTENDED_RELEASE_TABLET | Freq: Every day | ORAL | Status: DC

## 2012-02-22 NOTE — Progress Notes (Signed)
Patient: Veronica Powers Date of Encounter: 02/22/2012, 11:30 AM Admit date: 02/19/2012     Subjective  Veronica Powers reports right neck discomfort is "about the same" but she denies CP, SOB or palpitations.   Objective  Physical Exam: Vitals: BP 145/84  Pulse 78  Temp 97.6 F (36.4 C) (Oral)  Resp 20  Ht 4\' 11"  (1.499 m)  Wt 126 lb (57.153 kg)  BMI 25.45 kg/m2  SpO2 100% General: Well developed, elderly 79 year old female in no acute distress. Neck: Supple. JVD not elevated. Firm mass over right neck with tenderness to palpation. Lungs: Clear bilaterally to auscultation without wheezes, rales, or rhonchi. Breathing is unlabored. Heart: RRR S1 S2 without murmurs, rubs, or gallops.  Abdomen: Soft, non-distended. Extremities: No clubbing or cyanosis. No edema.  Distal pedal pulses are 2+ and equal bilaterally. Neuro: Alert and oriented X 3. Moves all extremities spontaneously. No focal deficits.  Intake/Output:  Intake/Output Summary (Last 24 hours) at 02/22/12 1130 Last data filed at 02/22/12 0717  Gross per 24 hour  Intake    180 ml  Output      0 ml  Net    180 ml    Inpatient Medications:     . allopurinol  100 mg Oral BID  . amoxicillin-clavulanate  1 tablet Oral Q12H  . atorvastatin  20 mg Oral Daily  . benazepril  20 mg Oral Daily  . Chlorhexidine Gluconate Cloth  6 each Topical Q0600  . colchicine  0.6 mg Oral BID  . furosemide  40 mg Oral Daily  . guanFACINE  2 mg Oral QHS  . isosorbide-hydrALAZINE  1 tablet Oral BID  . metoprolol tartrate  25 mg Oral TID  . mupirocin ointment  1 application Nasal BID  . sodium chloride  3 mL Intravenous Q12H  . Warfarin - Pharmacist Dosing Inpatient   Does not apply q1800    Labs:  [facility] 02/21/12 1117 02/20/12 0039 02/19/12 1539  NA 130* 132* --  K 3.2* 3.5 --  CL 94* 96 --  CO2 23 25 --  GLUCOSE 148* 122* --  BUN 18 21 --  CREATININE 0.88 1.10 --  CALCIUM 9.0 9.0 --  MG -- -- 2.0  PHOS -- -- --     Basename 02/20/12 0038 02/19/12 1540  CKTOTAL -- --  CKMB -- --  TROPONINI 0.48* 0.43*    Basename 02/19/12 1539  HGBA1C 6.6*    Basename 02/20/12 0039  CHOL 101  HDL 42  LDLCALC 40  TRIG 96  CHOLHDL 2.4    Basename 02/19/12 1539  TSH 1.207  T4TOTAL --  T3FREE --  THYROIDAB --    Basename 02/20/12 0102  VITAMINB12 699  FOLATE >20.0  FERRITIN 271  TIBC 173*  IRON 16*  RETICCTPCT 1.0    Basename 02/22/12 0725  INR 3.44*    Radiology/Studies: Dg Chest 2 View  02/19/2012  *RADIOLOGY REPORT*  Clinical Data: Cough, shortness of breath, and chest discomfort.  CHEST - 2 VIEW  Comparison: 10/19/2011 and multiple other chest x-rays dating back to 06/02/2006  Findings: Chronic mild cardiomegaly.  Main pulmonary arteries are chronically slightly prominent.  No infiltrates or effusions.  No acute osseous abnormality.  IMPRESSION: No acute disease.  Chronic mild cardiomegaly.   Original Report Authenticated By: Francene Boyers, M.D.     Echocardiogram: Study Conclusions - Left ventricle: Wall thickness was increased in a pattern of moderate LVH. Systolic function was moderately reduced. The estimated ejection  fraction was in the range of 40% to 50%. Diffuse hypokinesis. - Aortic valve: There was moderate to severe stenosis. Mild regurgitation. Valve area: 1.16cm^2(VTI). Valve area: 1.09cm^2 (Vmax). - Mitral valve: Calcified annulus. Mildly thickened leaflets. Mild regurgitation. - Left atrium: The atrium was moderately to severely dilated. - Right ventricle: The cavity size was mildly dilated. Systolic function was mildly reduced.  Telemetry: rate controlled AF    Assessment and Plan  79 yo with history of PAF, diastolic CHF, mild to moderate AS, probable tachy-brady syndrome presented with atrial fibrillation/RVR in the setting of right parotid sialoadenitis.   1. Atrial fibrillation - Rate now controlled. Asymptomatic. Likely triggered by pain from sialoadenitis. She  has been therapeutic on warfarin.  - Continue metoprolol 25 mg BID - Follow telemetry; she has had junctional bradycardia on metoprolol in the past, likely has tachy-brady syndrome - INR supratherapeutic; Coumadin on hold per pharmacy  2. Chronic diastolic HF - Stable  -  Continue Lasix 3. Acute right parotid sialoadenitis - WBC mildly elevated on admission (11,000); down to 9,700 yesterday - Continue supportive care with massage and heating pad as per ENT recommendations  4. Moderate to severe AS 5. HTN - BP mildly elevated currently, but yesterday was also low normal 91/72 - Pain with facial swelling may be contributing to mild elevation - Continue current regimen and follow   Further plans per Dr. Graciela Husbands Signed, EDMISTEN, BROOKE PA-C  6.  Hypokalemia-patient's potassium yesterday was 3.2. I don't see potassium repletion ordered. We will recheck it prior to discharge.

## 2012-02-22 NOTE — Progress Notes (Signed)
Spoke to Dr. Graciela Husbands regarding patient mobility and orders for discharge and informed about conversation with [facility] regarding PT evaluation. Dr. Graciela Husbands stated that patient has suffered severe CVA in past and has been taken care of by family. Spoke with patient's daughter Suan Halter and she stated that she, her husband and daughter has been caring for her mother and need no further assistance with care. Ok for d/c home per Dr. Graciela Husbands. Mamie Levers

## 2012-02-22 NOTE — Evaluation (Signed)
Physical Therapy Evaluation Patient Details Name: Veronica Powers MRN: [id number] DOB: [birth year] Today's Date: 02/22/2012 Time: 7829-5621 PT Time Calculation (min): 35 min  PT Assessment / Plan / Recommendation Clinical Impression  79 yo adm with a-fib with h/o CVA. Per pt, she has been getting weaker for 3 months and has lost ability to ambulate and go downstairs for meals. (Her bedroom is on 2nd level). She reports she is at home alone at times when daughter and son-in-law both have to work, and denies that anyone else comes to stay with her. When asked what she does when they are gone if she needs to use the bathroom, she says "I just wiggle and work until I get myself off the bed and onto the potty chair." Pt currently not safe to d/c home if there is not 24/7 assist. Not only does she require mod assist for sitting and standing, her HR increased to 144 bpm with this activity.    PT Assessment  Patient needs continued PT services    Follow Up Recommendations  CIR;Supervision/Assistance - 24 hour         Barriers to Discharge Decreased caregiver support;Inaccessible home environment 3 steps to enter, bedroom on 2nd floor, lack of 24/7 assist    Equipment Recommendations  Other (comment) (TBA if no post-acute inpatient therapies)    Recommendations for Other Services OT consult;Rehab consult   Frequency Min 3X/week    Precautions / Restrictions Precautions Precautions: Fall;Other (comment) (Stage II ulcer on coccyx per RN notes)   Pertinent Vitals/Pain Reports her bottom hurts (per RN notes has Stage II ulcer); denies other pain HR 84 up to 144 bpm with activity (101 bpm after 5 minute rest)      Mobility  Bed Mobility Bed Mobility: Supine to Sit Supine to Sit: 3: Mod assist;HOB flat;With rails Details for Bed Mobility Assistance: pt able to scoot to EOB in supine using RLE; hooks her LLE with her RLE to assist it over the EOB and then "hooks" RLE over side of mattress to try  to leverage herself up to side of bed (nearly scooting her hips off the side of the bed); vc to roll onto her Rt side and press up with her elbow and pt required mod assist to raise her trunk Transfers Transfers: Sit to Stand;Stand to Sit;Stand Pivot Transfers Sit to Stand: 3: Mod assist;With upper extremity assist;From bed;With armrests Stand to Sit: 3: Mod assist;With upper extremity assist;With armrests;To chair/3-in-1 Stand Pivot Transfers: 3: Mod assist;With armrests Details for Transfer Assistance: placed recliner to pt's Rt (where her BSC would be at home); Pt used arm of recliner to hold with RUE as attempted to stand. Pt's LLE pushed into extension with foot sliding out in front of her on first attempt. She returned to sitting on EOB (very close to slipping off EOB). She used her RLE to flex her LLE and place it under her and PT blocked her foot from sliding/extending in front of her on 2nd attempt. Strong posterior lean (?due to Lt hip extensor tone and mod assist to gain balance over her base of support). Pt then reached from near armrest to far armrest of chair and pivoted on her RLE to sit in chair. Ambulation/Gait Ambulation/Gait Assistance: Not tested (comment)         PT Diagnosis: Difficulty walking;Generalized weakness  PT Problem List: Decreased strength;Decreased activity tolerance;Decreased balance;Decreased mobility;Decreased knowledge of use of DME;Decreased safety awareness;Impaired sensation;Cardiopulmonary status limiting activity;Impaired tone;Decreased skin integrity PT Treatment Interventions:  DME instruction;Gait training;Stair training;Functional mobility training;Therapeutic activities;Therapeutic exercise;Balance training;Cognitive remediation;Patient/family education   PT Goals Acute Rehab PT Goals PT Goal Formulation: With patient Time For Goal Achievement: 03/07/12 Potential to Achieve Goals: Good Pt will Roll Supine to Right Side: with modified  independence PT Goal: Rolling Supine to Right Side - Progress: Goal set today Pt will go Supine/Side to Sit: with modified independence;with HOB 0 degrees PT Goal: Supine/Side to Sit - Progress: Goal set today Pt will Sit at Edge of Bed: with supervision;1-2 min;with no upper extremity support PT Goal: Sit at Edge Of Bed - Progress: Goal set today Pt will go Sit to Supine/Side: with modified independence;with HOB 0 degrees PT Goal: Sit to Supine/Side - Progress: Goal set today Pt will go Sit to Stand: with upper extremity assist;with supervision PT Goal: Sit to Stand - Progress: Goal set today Pt will go Stand to Sit: with upper extremity assist;Other (comment) (minguard assist) PT Goal: Stand to Sit - Progress: Goal set today Pt will Transfer Bed to Chair/Chair to Bed: with supervision PT Transfer Goal: Bed to Chair/Chair to Bed - Progress: Goal set today Pt will Stand: with supervision;1 - 2 min;with unilateral upper extremity support PT Goal: Stand - Progress: Goal set today Pt will Ambulate: 1 - 15 feet;with min assist;with least restrictive assistive device PT Goal: Ambulate - Progress: Goal set today  Visit Information  Last PT Received On: 02/22/12 Assistance Needed: +1 (transfer only )    Subjective Data  Subjective: Pt reports she is home alone at times (when both daughter and son-in-law are at work) and she can go bed to [facility] by herself when they are gone Patient Stated Goal: return home; be able to come downstairs to eat meals   Prior Functioning  Home Living Lives With: Daughter;Other (Comment) (Son-in-law) Available Help at Discharge: Family;Available PRN/intermittently (pt unable to give answer re: how often/long she is alone) Type of Home: House Home Access: Stairs to enter [employer] of Steps: 3 Home Layout: Two level;Bed/bath upstairs Alternate Level Stairs-Number of Steps: 14 Alternate Level Stairs-Rails: Right Home Adaptive Equipment: Bedside  commode/3-in-1;Straight cane Additional Comments: per RN, she has been unable to reach pt's daughter she lives with (per pt, she drives a school bus and is at work); per RN's discussion with pt's other daughter, she questions if pt needs more help than they can provide Prior Function Level of Independence: Needs assistance Needs Assistance: Bathing;Dressing;Meal Prep;Gait Gait Assistance: pt reports after stroke, she could walk with cane alone; has been unable to do so x 3 months "just got weak"  Thought she may have had another CVA, however saw her MD who said she did not. Able to Take Stairs?: Yes (with 1 person assist and rail) Driving: No Comments: reports she used to come downstairs each day, however with incr weakness in last 3 months, she has not been able to.  Communication Communication: No difficulties    Cognition  Cognition Overall Cognitive Status: Impaired Area of Impairment: Memory Behavior During Session: [facility] for tasks performed Memory Deficits: "I hope I'm remembering right...I have a hard time remembering"    Extremity/Trunk Assessment Right Upper Extremity Assessment RUE ROM/Strength/Tone: Deficits RUE ROM/Strength/Tone Deficits: AAROM WFL; shoulder 3+/5; biceps/triceps 4/5 RUE Coordination: WFL - gross motor Left Upper Extremity Assessment LUE ROM/Strength/Tone: Deficits LUE ROM/Strength/Tone Deficits: AROM pt unable to isolate shoulder flexion, biceps 3/5 vs flexor synergy Right Lower Extremity Assessment RLE ROM/Strength/Tone: WFL for tasks assessed RLE Coordination: WFL - gross motor Left  Lower Extremity Assessment LLE ROM/Strength/Tone: Deficits LLE ROM/Strength/Tone Deficits: AAROM WFL; pt with <3/5 strength; +extensor tone when she tries to stand with foot sliding forward and out from under her LLE Sensation: Deficits LLE Sensation Deficits: s/p CVA numbness Trunk Assessment Trunk Assessment: Other exceptions Trunk Exceptions: leans to the Rt in sitting    Balance Balance Balance Assessed: Yes Static Sitting Balance Static Sitting - Balance Support: Right upper extremity supported;Feet supported Static Sitting - Level of Assistance: 3: Mod assist Static Sitting - Comment/# of Minutes: pt very near EOB and + hip extensor tone as she was trying to adjust her position Static Standing Balance Static Standing - Balance Support: Right upper extremity supported Static Standing - Level of Assistance: 3: Mod assist Static Standing - Comment/# of Minutes: assist to place/hold LLE; posterior lean  End of Session PT - End of Session Equipment Utilized During Treatment: Gait belt Activity Tolerance: Patient limited by fatigue Patient left: in chair;with call bell/phone within reach Nurse Communication: Mobility status;Other (comment) (? safe to d/c home; per pt doesn't have 24/7 assist)  GP     Demontay Grantham 02/22/2012, 5:03 PM  Pager [phone]

## 2012-02-22 NOTE — Progress Notes (Signed)
ANTICOAGULATION CONSULT NOTE - Follow Up Consult  Pharmacy Consult for Coumadin Indication: atrial fibrillation  Allergies  Allergen Reactions  . Nifedipine     REACTION: made her weak    Labs:  Basename 02/22/12 0725 02/21/12 1117 02/20/12 0039 02/20/12 0038 02/19/12 1540 02/19/12 1050  HGB -- 11.6* 10.7* -- -- --  HCT -- 34.5* 31.2* -- -- 33.4*  PLT -- 239 253 -- -- 267  APTT -- -- -- -- -- --  LABPROT 32.7* 32.5* 24.3* -- -- --  INR 3.44* 3.41* 2.30* -- -- --  HEPARINUNFRC -- -- -- -- -- --  CREATININE -- 0.88 1.10 -- -- 1.05  CKTOTAL -- -- -- -- -- --  CKMB -- -- -- -- -- --  TROPONINI -- -- -- 0.48* 0.43* 0.49*    Estimated Creatinine Clearance: 36.7 ml/min (by C-G formula based on Cr of 0.88).   Assessment: 79 y/o female on chronic coumadin for PAF. Coumadin home dose 5mg  daily was resumed on 1/31,  Cbc stable, no bleeding per chart.  INR = 3.44 (still elevated  Goal of Therapy:  INR 2-3 Monitor platelets by anticoagulation protocol: Yes   Plan:  - Hold coumadin tonight - f/u INR in AM -RESUME home eye drops?  Thank you. Okey Regal, PharmD [phone]  02/22/2012 8:32 AM

## 2012-02-22 NOTE — Discharge Summary (Signed)
ELECTROPHYSIOLOGY DISCHARGE SUMMARY    Patient ID: Veronica Powers,  MRN: [id number], DOB/AGE: February 24, 1925 79 y.o.  Admit date: 02/19/2012 Discharge date: 02/23/2012  Primary Care Physician: [name], MD Primary EP: Berton Mount, MD  Primary Discharge Diagnosis:  1. Right parotid sialoadenitis 2. Atrial fibrillation 3. Decubitus ulcer, buttock  Secondary Discharge Diagnoses:  1. Chronic diastolic HF 2. Prior CVA 3. Moderate to severe AS 4. HTN 5. Gout 6. Glaucoma  Procedures This Admission:  1. Transthoracic echocardiogram 02/20/2012  Study Conclusions - Left ventricle: Wall thickness was increased in a pattern of moderate LVH. Systolic function was moderately reduced. The estimated ejection fraction was in the range of 40% to 50%. Diffuse hypokinesis. - Aortic valve: There was moderate to severe stenosis. Mild regurgitation. Valve area: 1.16cm^2(VTI). Valve area: 1.09cm^2 (Vmax). - Mitral valve: Calcified annulus. Mildly thickened leaflets. Mild regurgitation. - Left atrium: The atrium was moderately to severely dilated. - Right ventricle: The cavity size was mildly dilated. Systolic function was mildly reduced.  History and Hospital Course:  Veronica Powers is a pleasant 79 year old woman with known PAF, intermittent bradycardia, moderate to severe AS, chronic diastolic HF and prior CVA who presented to [facility] ED on 02/19/2012 with concerns regarding generalized weakness and abrupt swelling and pain of the right face and neck. On admission, she was found to have rapid atrial fibrillation and hypokalemia and therefore was admitted by Cardiology. Her potassium was repleted. She was initially started on Esmolol drip for rate control which was transitioned to Lopressor on 02/01/201. Her rates improved. She remains in AFib with rates in the 70s. She is asymptomatic. She has not had any recurrent bradycardia while on telemetry. She was found to have a mildly supratherapeutic INR and her  Coumadin was held accordingly. She remains euvolemic. She was seen in consultation by ENT who recommended increased fluid intake, heat and massage. Since she had mildly elevated WBCs, Augmentin was given for antibiotic coverage. Due to reported skin breakdown on her buttock, a wound care consult was obtained. Recommendation: add silicone foam dressing to protect and insulate wound to encourage reepithelialization; turn every 2 hours; may use zinc-based barrier if dressing adherence becomes an issue due to location. She remains hemodynamically stable. Her AF is rate controlled. She is afebrile and her WBCs decreased. She has been seen, examined and deemed stable for DC to home today by Dr. Berton Mount. She will follow-up with Dr. Chestine Spore, her PCP, on Monday Feb 10. She will wear a 24-hr Holter monitor to document adequate rate control and rule out significant bradyarrhythmia while on BB. Then see Dr. Graciela Husbands in follow-up in 2-3 weeks. She was instructed to hold her Coumadin tonight and restart lower dose tomorrow. She will have her INR drawn on Thursday Feb 6.   Discharge Vitals: Blood pressure 144/91, pulse 74, temperature 98 F (36.7 C), temperature source Oral, resp. rate 17, height 4\' 11"  (1.499 m), weight 126 lb (57.153 kg), SpO2 99.00%.   Labs: Lab Results  Component Value Date   WBC 9.7 02/21/2012   HGB 11.6* 02/21/2012   HCT 34.5* 02/21/2012   MCV 85.4 02/21/2012   PLT 239 02/21/2012     Lab 02/22/12 1546 02/19/12 1050  NA 132* --  K 3.9 --  CL 97 --  CO2 22 --  BUN 16 --  CREATININE 0.89 --  CALCIUM 8.5 --  PROT -- 7.8  BILITOT -- 1.2  ALKPHOS -- 108  ALT -- 29  AST -- 35  GLUCOSE 99 --   Lab Results  Component Value Date   CKTOTAL 146 06/04/2010   CKMB 2.4 06/04/2010   TROPONINI 0.48* 02/20/2012    Lab Results  Component Value Date   CHOL 101 02/20/2012   CHOL 88 06/04/2010   Lab Results  Component Value Date   HDL 42 02/20/2012   HDL 46 06/04/2010   Lab Results  Component Value Date     LDLCALC 40 02/20/2012   LDLCALC  Value: 22        Total Cholesterol/HDL:CHD Risk Coronary Heart Disease Risk Table                     Men   Women  1/2 Average Risk   3.4   3.3  Average Risk       5.0   4.4  2 X Average Risk   9.6   7.1  3 X Average Risk  23.4   11.0        Use the calculated Patient Ratio above and the CHD Risk Table to determine the patient's CHD Risk.        ATP III CLASSIFICATION (LDL):  <100     mg/dL   Optimal  244-010  mg/dL   Near or Above                    Optimal  130-159  mg/dL   Borderline  272-536  mg/dL   High  >644     mg/dL   Very High 0/34/7425   Lab Results  Component Value Date   TRIG 96 02/20/2012   TRIG 100 06/04/2010   Lab Results  Component Value Date   CHOLHDL 2.4 02/20/2012   CHOLHDL 1.9 06/04/2010    Basename 02/22/12 0725  INR 3.44*    Disposition:  The patient is being discharged in stable condition.  Follow-up:     Follow-up Information    Follow up with Laurena Slimmer, MD. On 02/29/2012. (At 12:00 noon for hospital follow-up)    Contact information:   [address] [place] [address] [phone]      Follow up with Sherryl Manges, MD. On 03/08/2012. (At 11:15 AM)    Contact information:   [address] [place] [address] [phone]      Follow up with [facility]. On 02/25/2012. (At 3:00 PM for Coumadin follow-up (INR check))    Contact information:   [address] [place] [address] [phone]      Follow up with [facility] Nurse. (For 24-hr Holter monitor; Jasmine December from our office will contact you with your appointment date and time)    Contact information:   [address] [place] [address] [phone]       Discharge Medications:    Medication List     As of 02/23/2012  6:34 AM    STOP taking these medications         hydrALAZINE 10 MG tablet   Commonly known as: APRESOLINE      TAKE these medications          allopurinol 100 MG tablet   Commonly known as: ZYLOPRIM   Take 100 mg by mouth 2 (two) times daily.      amoxicillin-clavulanate 875-125 MG per tablet   Commonly known as: AUGMENTIN   Take 1 tablet by mouth every 12 (twelve) hours. For 7 days then stop.  atorvastatin 20 MG tablet   Commonly known as: LIPITOR   Take 20 mg by mouth daily.      benazepril 20 MG tablet   Commonly known as: LOTENSIN   Take 20 mg by mouth daily.      bimatoprost 0.03 % ophthalmic solution   Commonly known as: LUMIGAN   Place 1 drop into both eyes at bedtime.      brimonidine 0.15 % ophthalmic solution   Commonly known as: ALPHAGAN   Place 1 drop into both eyes 2 (two) times daily.      brinzolamide 1 % ophthalmic suspension   Commonly known as: AZOPT   Place 1 drop into both eyes 2 (two) times daily.      COLCRYS 0.6 MG tablet   Generic drug: colchicine   Take 0.6 mg by mouth 2 (two) times daily. prn      fluticasone 50 MCG/ACT nasal spray   Commonly known as: FLONASE   Place 2 sprays into the nose daily. prn      furosemide 40 MG tablet   Commonly known as: LASIX   Take 40 mg by mouth daily.      guanFACINE 2 MG tablet   Commonly known as: TENEX   Take 2 mg by mouth at bedtime.      indomethacin 25 MG capsule   Commonly known as: INDOCIN   Take 25-50 mg by mouth 2 (two) times daily with a meal. For gout flare ups      isosorbide-hydrALAZINE 20-37.5 MG per tablet   Commonly known as: BIDIL   Take 1 tablet by mouth 2 (two) times daily.      metoprolol tartrate 25 MG tablet   Commonly known as: LOPRESSOR   Take 1 tablet (25 mg total) by mouth 2 (two) times daily.      oxybutynin 10 MG 24 hr tablet   Commonly known as: DITROPAN-XL   Take 10 mg by mouth daily as needed. For bladder control      pilocarpine 4 % ophthalmic solution   Commonly known as: PILOCAR   Place 1 drop into both eyes 4 (four) times daily.      potassium chloride 10 MEQ tablet   Commonly known as: K-DUR     Take 2 tablets (20 mEq total) by mouth daily.      REFRESH PLUS 0.5 % Soln   Generic drug: carboxymethylcellulose   Place 1 drop into both eyes as needed.      warfarin 5 MG tablet   Commonly known as: COUMADIN   HOLD Coumadin tonight. Starting Tuesday, 02/23/2012, take 1/2 tablet once daily.        Duration of Discharge Encounter: Greater than 30 minutes including physician time.  Signed, Rick Duff, PA-C 02/23/2012, 6:34 AM

## 2012-02-23 ENCOUNTER — Telehealth: Payer: Self-pay | Admitting: *Deleted

## 2012-02-23 NOTE — Telephone Encounter (Signed)
Caregiver called stating pt is just home from the [facility] last night and was instructed to hold coumadin last night and then to take coumadin 1/2 tablet  ( 2.5mg )  daily and caregiver wanted nurse to dose her coumadin again so would not need to come in for scheduled appt on 02/25/2012 Talked with Pharmacist at [facility] and  she states after looking at her INR ranges in the hosp  That she needs to keep her appt as scheduled on 02/25/2012  This nurse called caregiver back with instruction that she needs to keep appt on 02/25/2012  And the caregiver states understanding.

## 2012-02-23 NOTE — Telephone Encounter (Signed)
TCM patient. LMOM for call back.

## 2012-02-23 NOTE — Telephone Encounter (Signed)
Called patient's daughter. She is aware of CC appointment and post hosp appointment with Dr.Klein. Has all medications. No acute complaints.

## 2012-02-25 ENCOUNTER — Ambulatory Visit: Payer: PRIVATE HEALTH INSURANCE

## 2012-02-29 ENCOUNTER — Ambulatory Visit (INDEPENDENT_AMBULATORY_CARE_PROVIDER_SITE_OTHER): Payer: PRIVATE HEALTH INSURANCE | Admitting: *Deleted

## 2012-02-29 DIAGNOSIS — Z7901 Long term (current) use of anticoagulants: Secondary | ICD-10-CM

## 2012-02-29 DIAGNOSIS — I4891 Unspecified atrial fibrillation: Secondary | ICD-10-CM

## 2012-03-01 ENCOUNTER — Other Ambulatory Visit: Payer: Self-pay | Admitting: *Deleted

## 2012-03-01 DIAGNOSIS — I4891 Unspecified atrial fibrillation: Secondary | ICD-10-CM

## 2012-03-08 ENCOUNTER — Telehealth: Payer: Self-pay | Admitting: *Deleted

## 2012-03-08 ENCOUNTER — Encounter (INDEPENDENT_AMBULATORY_CARE_PROVIDER_SITE_OTHER): Payer: PRIVATE HEALTH INSURANCE

## 2012-03-08 ENCOUNTER — Ambulatory Visit (INDEPENDENT_AMBULATORY_CARE_PROVIDER_SITE_OTHER): Payer: PRIVATE HEALTH INSURANCE | Admitting: *Deleted

## 2012-03-08 ENCOUNTER — Encounter: Payer: Self-pay | Admitting: Internal Medicine

## 2012-03-08 ENCOUNTER — Ambulatory Visit (INDEPENDENT_AMBULATORY_CARE_PROVIDER_SITE_OTHER): Payer: PRIVATE HEALTH INSURANCE | Admitting: Internal Medicine

## 2012-03-08 VITALS — BP 93/65 | HR 117 | Ht 59.0 in | Wt 139.0 lb

## 2012-03-08 DIAGNOSIS — I4891 Unspecified atrial fibrillation: Secondary | ICD-10-CM

## 2012-03-08 DIAGNOSIS — R531 Weakness: Secondary | ICD-10-CM

## 2012-03-08 DIAGNOSIS — R0989 Other specified symptoms and signs involving the circulatory and respiratory systems: Secondary | ICD-10-CM

## 2012-03-08 DIAGNOSIS — Z7901 Long term (current) use of anticoagulants: Secondary | ICD-10-CM

## 2012-03-08 DIAGNOSIS — R9431 Abnormal electrocardiogram [ECG] [EKG]: Secondary | ICD-10-CM

## 2012-03-08 DIAGNOSIS — R5381 Other malaise: Secondary | ICD-10-CM

## 2012-03-08 LAB — BASIC METABOLIC PANEL
BUN: 42 mg/dL — ABNORMAL HIGH (ref 6–23)
CO2: 21 mEq/L (ref 19–32)
Calcium: 9.4 mg/dL (ref 8.4–10.5)
GFR: 23.34 mL/min — ABNORMAL LOW (ref >60.00)
Glucose, Bld: 135 mg/dL — ABNORMAL HIGH (ref 70–99)

## 2012-03-08 LAB — MAGNESIUM: Magnesium: 1.6 mg/dL (ref 1.5–2.5)

## 2012-03-08 LAB — CBC WITH DIFFERENTIAL/PLATELET
Basophils Absolute: 0 10*3/uL (ref 0.0–0.1)
Eosinophils Absolute: 0 10*3/uL (ref 0.0–0.7)
HCT: 36.3 % (ref 36.0–46.0)
Lymphocytes Relative: 30.7 % (ref 12.0–46.0)
Lymphs Abs: 1.7 10*3/uL (ref 0.7–4.0)
MCHC: 33.5 g/dL (ref 30.0–36.0)
Monocytes Relative: 3.4 % (ref 3.0–12.0)
Platelets: 537 10*3/uL — ABNORMAL HIGH (ref 150.0–400.0)
RDW: 17.9 % — ABNORMAL HIGH (ref 11.5–14.6)

## 2012-03-08 MED ORDER — BENAZEPRIL HCL 20 MG PO TABS: 10.0000 mg | ORAL_TABLET | Freq: Every day | ORAL | Status: DC

## 2012-03-08 NOTE — Assessment & Plan Note (Signed)
The patient currently is anticoagulated. There is no major dyspnea. There is no asymmetric edema. I'm not sure of the cause of the ECG change. We'll check an echo

## 2012-03-08 NOTE — Patient Instructions (Addendum)
Your physician recommends that you schedule a follow-up appointment in: 4 weeks with Dr Graciela Husbands  Your physician has recommended you make the following change in your medication: DECREASE Benazepril 1/2 daily and HOLD Isosorbide  Your physician recommends that you have lab work drawn today - BMP, CBC, Magnesium

## 2012-03-08 NOTE — Telephone Encounter (Signed)
24 hr holter monitor placed on PT 03/08/12 TK 

## 2012-03-08 NOTE — Progress Notes (Signed)
Patient Care Team: Laurena Slimmer, MD as PCP - General (Endocrinology)   HPI  Veronica Powers is a 79 y.o. female  seen in followup for atrial fibrillation-paroxysmal with rapid rates alternating with junctional rhythm. The fall 2013 beta blockers were discontinued because of a symptomatic junctional bradycardia.she also has moderate to severe aortic stenosis and chronic HFpEF  She is a history of a prior stroke and hypertension was found recently to have rapid atrial fibrillation in hospital when she presented with weakness and found to have sialoadenitis and hypokalemia  She comes in today for followup of her atrial fibrillation. She feels and looks like she feels terrible. Review of medications demonstrates metoprolol is new and potassium is new.  Review of blood work in the hospital had demonstrated iron deficiency anemia with a saturation percent and 9% a hemoglobin of 10.  I should note that she is on Coumadin with a therapeutic INR.  Past Medical History  Diagnosis Date  . Atrial fibrillation     permanent  . Atrial fibrillation with controlled ventricular response   . Aortic stenosis, moderate   . Diabetes mellitus     type 2  . Stroke     left sided residual  . CHF (congestive heart failure)   . Hypertension   . Gout   . Anginal pain   . Shortness of breath   . GERD (gastroesophageal reflux disease)     No past surgical history on file.  Current Outpatient Prescriptions  Medication Sig Dispense Refill  . allopurinol (ZYLOPRIM) 100 MG tablet Take 100 mg by mouth 2 (two) times daily.       Marland Kitchen amoxicillin-clavulanate (AUGMENTIN) 875-125 MG per tablet Take 1 tablet by mouth every 12 (twelve) hours. For 7 days then stop.  14 tablet  0  . atorvastatin (LIPITOR) 20 MG tablet Take 20 mg by mouth daily.        . benazepril (LOTENSIN) 20 MG tablet Take 20 mg by mouth daily.      . bimatoprost (LUMIGAN) 0.03 % ophthalmic solution Place 1 drop into both eyes at bedtime.        .  brimonidine (ALPHAGAN) 0.15 % ophthalmic solution Place 1 drop into both eyes 2 (two) times daily.        . brinzolamide (AZOPT) 1 % ophthalmic suspension Place 1 drop into both eyes 2 (two) times daily.        . carboxymethylcellulose (REFRESH PLUS) 0.5 % SOLN Place 1 drop into both eyes as needed.        . COLCRYS 0.6 MG tablet Take 0.6 mg by mouth 2 (two) times daily. prn      . fluticasone (FLONASE) 50 MCG/ACT nasal spray Place 2 sprays into the nose daily. prn      . furosemide (LASIX) 40 MG tablet Take 40 mg by mouth daily.       Marland Kitchen guanFACINE (TENEX) 2 MG tablet Take 2 mg by mouth at bedtime.      . indomethacin (INDOCIN) 25 MG capsule Take 25-50 mg by mouth 2 (two) times daily with a meal. For gout flare ups      . isosorbide-hydrALAZINE (BIDIL) 20-37.5 MG per tablet Take 1 tablet by mouth 2 (two) times daily.      . metoprolol tartrate (LOPRESSOR) 25 MG tablet Take 1 tablet (25 mg total) by mouth 2 (two) times daily.  60 tablet  3  . oxybutynin (DITROPAN-XL) 10 MG 24 hr tablet Take 10 mg by  mouth daily as needed. For bladder control      . pilocarpine (PILOCAR) 4 % ophthalmic solution Place 1 drop into both eyes 4 (four) times daily.        . potassium chloride (K-DUR) 10 MEQ tablet Take 2 tablets (20 mEq total) by mouth daily.  60 tablet  3  . warfarin (COUMADIN) 5 MG tablet HOLD Coumadin tonight. Starting Tuesday, 02/23/2012, take 1/2 tablet once daily.  30 tablet  3   No current facility-administered medications for this visit.    Allergies  Allergen Reactions  . Nifedipine     REACTION: made her weak    Review of Systems negative except from HPI and PMH  Physical Exam BP 93/65  Pulse 117  Ht 4\' 11"  (1.499 m)  Wt 139 lb (63.05 kg)  BMI 28.06 kg/m2  SpO2 100% Clearly feeling lousy sitting in a wheelchair with her head bent over. HENT normal E scleral and icterus clear Neck Supple   carotids brisk and full Clear to ausculation  Irregular rate and rhythm, no murmurs  gallops or rub Soft with active bowel sounds No clubbing cyanosis none Edema Alert and oriented, grossly normal motor and sensory function Skin Warm and Dry    atrial fibrillation of/atypical flutter with a ventricular rate of 114 Intervals-/09/36 Axis is rightward at 134  Assessment and  Plan

## 2012-03-08 NOTE — Assessment & Plan Note (Signed)
As above.

## 2012-03-08 NOTE — Assessment & Plan Note (Signed)
Patient has significant weakness is noted to have a blood pressure of 93. The weakness may also be related to her beta blocker. We'll start by reducing her antihypertensives. We'll decrease his Lopressor if from 20-10. I will hold her hydralazine/nitrates. We will continue her on low-dose Lopressor as her atrial rate is relatively rapid. I suspect this may be in part secondary to hypotension but I'm not sure. She is to see Dr. Chestine Spore in 10 days. Asked him to call Dr. Chestine Spore in 48 hours with a blood pressure reading.

## 2012-03-09 ENCOUNTER — Telehealth: Payer: Self-pay | Admitting: *Deleted

## 2012-03-09 NOTE — Telephone Encounter (Signed)
Notes Recorded by Jefferey Pica, RN on 03/09/2012 at 7:19 PM Results reviewed with Dr. Riley Kill. Recommendation is for the patient to report to the ER with documented weakness and EKG change noted at her office visit with Dr. Graciela Husbands yesterday. I called the patient at her contact #'s and left messages to call. I called both her daughter's contact and left messages to call. In retrying the numbers, I was able to get a hold of the patient's grand daughter, Drexal, and reported the significance of these findings to her. I advised that the patient needs to report immediately to the ER at [facility]. Per Drexal, she is going to call her mother now and have her take the patient there. I have called the [facility] ER and spoken with Molly Maduro, triage RN, and he is aware of the situation.

## 2012-03-10 ENCOUNTER — Telehealth: Payer: Self-pay | Admitting: Internal Medicine

## 2012-03-10 DIAGNOSIS — E875 Hyperkalemia: Secondary | ICD-10-CM

## 2012-03-10 DIAGNOSIS — N289 Disorder of kidney and ureter, unspecified: Secondary | ICD-10-CM
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
## 2012-03-11 ENCOUNTER — Telehealth: Payer: Self-pay | Admitting: Internal Medicine

## 2012-03-11 ENCOUNTER — Other Ambulatory Visit (INDEPENDENT_AMBULATORY_CARE_PROVIDER_SITE_OTHER): Payer: PRIVATE HEALTH INSURANCE

## 2012-03-11 DIAGNOSIS — N289 Disorder of kidney and ureter, unspecified: Secondary | ICD-10-CM

## 2012-03-11 DIAGNOSIS — E875 Hyperkalemia: Secondary | ICD-10-CM

## 2012-03-11 LAB — BASIC METABOLIC PANEL
BUN: 47 mg/dL — ABNORMAL HIGH (ref 6–23)
Chloride: 104 mEq/L (ref 96–112)
Creatinine, Ser: 2 mg/dL — ABNORMAL HIGH (ref 0.4–1.2)
GFR: 30.83 mL/min — ABNORMAL LOW (ref >60.00)

## 2012-03-11 NOTE — Addendum Note (Signed)
Addended by: Sherri Rad C on: 03/11/2012 10:52 AM   Modules accepted: Orders

## 2012-03-11 NOTE — Telephone Encounter (Signed)
Repeat bmp today shows K+ of 4.3 and creatinine of 2.0. Results reviewed with Dr. Graciela Husbands. Orders received to continue to hold benazepril, lasix, and potassium and follow up with Dr. [name] early next week. I have spoken with the patient's grand daughter, Drexal, and she voices understanding. Labs and office note faxed to Dr. Ophelia Charter office.

## 2012-03-11 NOTE — Telephone Encounter (Signed)
Dr. Graciela Husbands attempted to contact the patient & her daughter this morning at 3 different contact #'s. He was only able to leave a message on one and did state the patient needed to report to the ER. I will try to call her back within the hour.

## 2012-03-11 NOTE — Telephone Encounter (Addendum)
I spoke with the patient's daughter. The patient is still refusing the ER. I have advised at the very least she needs to come her for a follow up STAT lab now. I have also advised the patient and her that if her lab comes back abnormal today, the recommendation will still be for the ER. They were not wanting to bring her lab now due to the rain and the chance of her gout flaring up. I have explained to the patient and her daughter that if she does not have this followed up on, the risk could be death. They will come now for a repeat STAT BMP. I did verify that the patient started holding her potassium and benazepril yesterday.

## 2012-03-11 NOTE — Telephone Encounter (Signed)
I have left a message at the patient's home number to call the office ASAP. Other contact #'s do not answer and are not capable of leaving messages. I am not certain if the patient is still on her ACE-I, potassium, and diuretics.

## 2012-03-29 ENCOUNTER — Inpatient Hospital Stay (HOSPITAL_COMMUNITY)
Admission: EM | Admit: 2012-03-29 | Discharge: 2012-04-03 | DRG: 682 | Disposition: A | Discharge status: Home Health | Payer: PRIVATE HEALTH INSURANCE | Attending: Internal Medicine | Admitting: Internal Medicine

## 2012-03-29 ENCOUNTER — Encounter (HOSPITAL_COMMUNITY): Payer: Self-pay | Admitting: Physical Medicine and Rehabilitation

## 2012-03-29 ENCOUNTER — Emergency Department (HOSPITAL_COMMUNITY): Payer: PRIVATE HEALTH INSURANCE

## 2012-03-29 DIAGNOSIS — Z7901 Long term (current) use of anticoagulants: Secondary | ICD-10-CM

## 2012-03-29 DIAGNOSIS — R5381 Other malaise: Secondary | ICD-10-CM | POA: Diagnosis present

## 2012-03-29 DIAGNOSIS — N179 Acute kidney failure, unspecified: Principal | ICD-10-CM

## 2012-03-29 DIAGNOSIS — I359 Nonrheumatic aortic valve disorder, unspecified: Secondary | ICD-10-CM

## 2012-03-29 DIAGNOSIS — E876 Hypokalemia: Secondary | ICD-10-CM

## 2012-03-29 DIAGNOSIS — I119 Hypertensive heart disease without heart failure: Secondary | ICD-10-CM

## 2012-03-29 DIAGNOSIS — I69998 Other sequelae following unspecified cerebrovascular disease: Secondary | ICD-10-CM

## 2012-03-29 DIAGNOSIS — N189 Chronic kidney disease, unspecified: Secondary | ICD-10-CM | POA: Diagnosis present

## 2012-03-29 DIAGNOSIS — I130 Hypertensive heart and chronic kidney disease with heart failure and stage 1 through stage 4 chronic kidney disease, or unspecified chronic kidney disease: Secondary | ICD-10-CM | POA: Diagnosis present

## 2012-03-29 DIAGNOSIS — R627 Adult failure to thrive: Secondary | ICD-10-CM

## 2012-03-29 DIAGNOSIS — I5032 Chronic diastolic (congestive) heart failure: Secondary | ICD-10-CM | POA: Diagnosis present

## 2012-03-29 DIAGNOSIS — E119 Type 2 diabetes mellitus without complications: Secondary | ICD-10-CM

## 2012-03-29 DIAGNOSIS — I214 Non-ST elevation (NSTEMI) myocardial infarction: Secondary | ICD-10-CM

## 2012-03-29 DIAGNOSIS — D649 Anemia, unspecified: Secondary | ICD-10-CM | POA: Diagnosis not present

## 2012-03-29 DIAGNOSIS — Z79899 Other long term (current) drug therapy: Secondary | ICD-10-CM

## 2012-03-29 DIAGNOSIS — R7989 Other specified abnormal findings of blood chemistry: Secondary | ICD-10-CM

## 2012-03-29 DIAGNOSIS — R531 Weakness: Secondary | ICD-10-CM

## 2012-03-29 DIAGNOSIS — I4891 Unspecified atrial fibrillation: Secondary | ICD-10-CM

## 2012-03-29 DIAGNOSIS — K219 Gastro-esophageal reflux disease without esophagitis: Secondary | ICD-10-CM | POA: Diagnosis present

## 2012-03-29 DIAGNOSIS — I509 Heart failure, unspecified: Secondary | ICD-10-CM | POA: Diagnosis present

## 2012-03-29 DIAGNOSIS — M109 Gout, unspecified: Secondary | ICD-10-CM | POA: Diagnosis present

## 2012-03-29 DIAGNOSIS — G8250 Quadriplegia, unspecified: Secondary | ICD-10-CM | POA: Diagnosis present

## 2012-03-29 DIAGNOSIS — Z7401 Bed confinement status: Secondary | ICD-10-CM

## 2012-03-29 HISTORY — DX: Type 2 diabetes mellitus without complications: E11.9

## 2012-03-29 LAB — CBC WITH DIFFERENTIAL/PLATELET
Basophils Relative: 0 % (ref 0–1)
Eosinophils Absolute: 0.1 10*3/uL (ref 0.0–0.7)
Hemoglobin: 11.3 g/dL — ABNORMAL LOW (ref 12.0–15.0)
Lymphs Abs: 2 10*3/uL (ref 0.7–4.0)
MCH: 29.7 pg (ref 26.0–34.0)
Monocytes Relative: 3 % (ref 3–12)
Neutro Abs: 8.2 10*3/uL — ABNORMAL HIGH (ref 1.7–7.7)
Neutrophils Relative %: 77 % (ref 43–77)
Platelets: 286 10*3/uL (ref 150–400)
RBC: 3.81 MIL/uL — ABNORMAL LOW (ref 3.87–5.11)
WBC: 10.7 10*3/uL — ABNORMAL HIGH (ref 4.0–10.5)

## 2012-03-29 LAB — BASIC METABOLIC PANEL
BUN: 22 mg/dL (ref 6–23)
Chloride: 93 mEq/L — ABNORMAL LOW (ref 96–112)
GFR calc Af Amer: 37 mL/min — ABNORMAL LOW (ref >90)
GFR calc non Af Amer: 32 mL/min — ABNORMAL LOW (ref >90)
Glucose, Bld: 143 mg/dL — ABNORMAL HIGH (ref 70–99)
Potassium: 3.2 mEq/L — ABNORMAL LOW (ref 3.5–5.1)
Sodium: 133 mEq/L — ABNORMAL LOW (ref 135–145)

## 2012-03-29 LAB — PROTIME-INR
INR: 1.66 — ABNORMAL HIGH (ref 0.00–1.49)
Prothrombin Time: 19.1 seconds — ABNORMAL HIGH (ref 11.6–15.2)

## 2012-03-29 LAB — GLUCOSE, CAPILLARY: Glucose-Capillary: 97 mg/dL (ref 70–99)

## 2012-03-29 LAB — APTT: aPTT: 37 seconds (ref 24–37)

## 2012-03-29 MED ORDER — BRIMONIDINE TARTRATE 0.2 % OP SOLN
1.0000 [drp] | Freq: Two times a day (BID) | OPHTHALMIC | Status: DC
  Administered 2012-03-29 – 2012-04-03 (×10): 1 [drp] via OPHTHALMIC
  Filled 2012-03-29: qty 5

## 2012-03-29 MED ORDER — HEPARIN (PORCINE) IN NACL 100-0.45 UNIT/ML-% IJ SOLN
1050.0000 [IU]/h | INTRAMUSCULAR | Status: DC
  Administered 2012-03-29: 850 [IU]/h via INTRAVENOUS
  Filled 2012-03-29 (×3): qty 250

## 2012-03-29 MED ORDER — SODIUM CHLORIDE 0.9 % IJ SOLN
3.0000 mL | Freq: Two times a day (BID) | INTRAMUSCULAR | Status: DC
  Administered 2012-03-31 – 2012-04-03 (×7): 3 mL via INTRAVENOUS

## 2012-03-29 MED ORDER — SODIUM CHLORIDE 0.9 % IV SOLN
INTRAVENOUS | Status: DC
  Administered 2012-03-29: 19:00:00 via INTRAVENOUS

## 2012-03-29 MED ORDER — POLYVINYL ALCOHOL 1.4 % OP SOLN
1.0000 [drp] | OPHTHALMIC | Status: DC | PRN
  Administered 2012-04-02: 1 [drp] via OPHTHALMIC
  Filled 2012-03-29: qty 15

## 2012-03-29 MED ORDER — BIMATOPROST 0.01 % OP SOLN
1.0000 [drp] | Freq: Every day | OPHTHALMIC | Status: DC
  Filled 2012-03-29: qty 2.5

## 2012-03-29 MED ORDER — ONDANSETRON HCL 4 MG PO TABS: 4.0000 mg | ORAL_TABLET | Freq: Four times a day (QID) | ORAL | Status: DC | PRN

## 2012-03-29 MED ORDER — WARFARIN - PHYSICIAN DOSING INPATIENT: Freq: Every day | Status: DC

## 2012-03-29 MED ORDER — POLYETHYLENE GLYCOL 3350 17 G PO PACK
17.0000 g | PACK | Freq: Every day | ORAL | Status: DC | PRN
  Filled 2012-03-29: qty 1

## 2012-03-29 MED ORDER — BIMATOPROST 0.03 % OP SOLN
1.0000 [drp] | Freq: Every day | OPHTHALMIC | Status: DC
  Filled 2012-03-29: qty 2.5

## 2012-03-29 MED ORDER — ATORVASTATIN CALCIUM 20 MG PO TABS
20.0000 mg | ORAL_TABLET | Freq: Every day | ORAL | Status: DC
  Administered 2012-03-29 – 2012-04-03 (×6): 20 mg via ORAL
  Filled 2012-03-29 (×6): qty 1

## 2012-03-29 MED ORDER — ACETAMINOPHEN 650 MG RE SUPP: 650.0000 mg | Freq: Four times a day (QID) | RECTAL | Status: DC | PRN

## 2012-03-29 MED ORDER — DILTIAZEM HCL 100 MG IV SOLR: 5.0000 mg/h | INTRAVENOUS | Status: DC

## 2012-03-29 MED ORDER — HEPARIN BOLUS VIA INFUSION
2000.0000 [IU] | Freq: Once | INTRAVENOUS | Status: DC
  Filled 2012-03-29: qty 2000

## 2012-03-29 MED ORDER — DILTIAZEM HCL 50 MG/10ML IV SOLN
15.0000 mg | Freq: Once | INTRAVENOUS | Status: DC
  Filled 2012-03-29: qty 3

## 2012-03-29 MED ORDER — BRIMONIDINE TARTRATE 0.15 % OP SOLN
1.0000 [drp] | Freq: Two times a day (BID) | OPHTHALMIC | Status: DC
  Filled 2012-03-29: qty 5

## 2012-03-29 MED ORDER — SODIUM CHLORIDE 0.9 % IV SOLN: INTRAVENOUS | Status: DC

## 2012-03-29 MED ORDER — GUANFACINE HCL 2 MG PO TABS
2.0000 mg | ORAL_TABLET | Freq: Every day | ORAL | Status: DC
  Administered 2012-03-29 – 2012-04-02 (×5): 2 mg via ORAL
  Filled 2012-03-29 (×8): qty 1

## 2012-03-29 MED ORDER — ACETAMINOPHEN 325 MG PO TABS: 650.0000 mg | ORAL_TABLET | Freq: Four times a day (QID) | ORAL | Status: DC | PRN

## 2012-03-29 MED ORDER — GUAIFENESIN ER 600 MG PO TB12
1200.0000 mg | ORAL_TABLET | Freq: Every day | ORAL | Status: DC | PRN
  Filled 2012-03-29: qty 2

## 2012-03-29 MED ORDER — WARFARIN SODIUM 5 MG PO TABS
5.0000 mg | ORAL_TABLET | Freq: Every evening | ORAL | Status: DC
  Filled 2012-03-29: qty 1

## 2012-03-29 MED ORDER — PILOCARPINE HCL 4 % OP SOLN
1.0000 [drp] | Freq: Four times a day (QID) | OPHTHALMIC | Status: DC
  Administered 2012-03-29 – 2012-04-03 (×16): 1 [drp] via OPHTHALMIC
  Filled 2012-03-29: qty 15

## 2012-03-29 MED ORDER — CARBOXYMETHYLCELLULOSE SODIUM 0.5 % OP SOLN
1.0000 [drp] | OPHTHALMIC | Status: DC | PRN
Start: 2012-03-29 — End: 2012-03-29

## 2012-03-29 MED ORDER — COLCHICINE 0.6 MG PO TABS
0.6000 mg | ORAL_TABLET | Freq: Two times a day (BID) | ORAL | Status: DC
  Administered 2012-03-29 – 2012-03-30 (×2): 0.6 mg via ORAL
  Filled 2012-03-29 (×3): qty 1

## 2012-03-29 MED ORDER — ONDANSETRON HCL 4 MG/2ML IJ SOLN: 4.0000 mg | Freq: Four times a day (QID) | INTRAMUSCULAR | Status: DC | PRN

## 2012-03-29 MED ORDER — WARFARIN SODIUM 5 MG PO TABS
5.0000 mg | ORAL_TABLET | Freq: Once | ORAL | Status: AC
  Administered 2012-03-29: 5 mg via ORAL
  Filled 2012-03-29: qty 1

## 2012-03-29 MED ORDER — OXYBUTYNIN CHLORIDE ER 10 MG PO TB24
10.0000 mg | ORAL_TABLET | Freq: Every day | ORAL | Status: DC | PRN
  Filled 2012-03-29: qty 1

## 2012-03-29 MED ORDER — BRINZOLAMIDE 1 % OP SUSP
1.0000 [drp] | Freq: Two times a day (BID) | OPHTHALMIC | Status: DC
  Administered 2012-03-29 – 2012-04-03 (×10): 1 [drp] via OPHTHALMIC
  Filled 2012-03-29: qty 10

## 2012-03-29 MED ORDER — ADULT MULTIVITAMIN W/MINERALS CH
1.0000 | ORAL_TABLET | Freq: Every day | ORAL | Status: DC
  Administered 2012-03-29 – 2012-04-03 (×6): 1 via ORAL
  Filled 2012-03-29 (×6): qty 1

## 2012-03-29 NOTE — ED Notes (Addendum)
Holding cardizem per cardiology PA-C until evaluated by cardiology MD

## 2012-03-29 NOTE — ED Notes (Signed)
Pt taken to room via wheelchair; assisted daughter with undressing of her mother (the patient) and dressed her into a gown, socks and warm blankets; family at bedside

## 2012-03-29 NOTE — ED Notes (Signed)
Pt presents to department for evaluation of hypotension and generalized weakness. Was seen by PCP today. Daughter states she has been very tired and not acting like her usual self. Denies pain at the time. Pt is alert and oriented x4. Skin warm and dry.

## 2012-03-29 NOTE — ED Notes (Signed)
Patient states she is unable to provide a urine sample at this time.

## 2012-03-29 NOTE — ED Notes (Signed)
Unable to gain IV access after 1 attempt. IV team has been paged per family request.

## 2012-03-29 NOTE — ED Notes (Signed)
Pt refused in/out cath, on bedpan at this time

## 2012-03-29 NOTE — ED Notes (Signed)
IV team in room at this time.  

## 2012-03-29 NOTE — H&P (Addendum)
[facility] History and Physical  Zannie Locastro ZOX:[id number] DOB: [birth year] DOA: 03/29/2012  Referring physician: Dr. Ignacia Palma PCP: Laurena Slimmer, MD  Specialists: none  Chief Complaint: weakness  HPI: Veronica Powers is a 79 y.o. female  Past medical history of chronic diastolic heart failure,paroxysmal A. Fib currently on Coumadin recently discharge from the hospital on January 2014 right parotitis parotid sialoadenitis on  this admission her Lasix was increased and she was also started on, so she went to see her primary care doctor onat that time her potassium was 6.0 her creatinine was 2.5. Her (her baseline creatinine on January 2014 was 0.8) at that time he (metolazone potassium and continue her Lasix she comes in for her weakness and has been on for several days. She also had paroxysmal A. Fib in the 160s. In the emergency room she was given diltiazem and she converted back into sinus rhythm. Patient the patient denies any shortness of breath chest pain or swelling. She relates her urine is very concentrated and she is thirsty all the time. Here in the emergency room her sodium was 133 chloride 93 creatinine is 1.4 white count 10.7 with a differential of 8.2. At this of cardiac enzymes was elevated at 0.5 in the emergency room consulted cardiology for elevation of her cardiac enzymes and paroxysmal A. Fib. As she converted to normal sinus rhythm A. Defer to admission to internal medicine.   Review of Systems: The patient denies anorexia, fever, weight loss,, vision loss, decreased hearing, hoarseness, chest pain, syncope, dyspnea on exertion, peripheral edema, balance deficits, hemoptysis, abdominal pain, melena, hematochezia, severe indigestion/heartburn, hematuria, incontinence, genital sores, muscle weakness, suspicious skin lesions, transient blindness, difficulty walking, depression, unusual weight change, abnormal bleeding, enlarged lymph nodes, angioedema, and breast masses.     Past Medical History  Diagnosis Date  . Atrial fibrillation     permanent  . Atrial fibrillation with controlled ventricular response   . Aortic stenosis, moderate   . Diabetes     type 2  . Stroke     left sided residual  . CHF (congestive heart failure)   . Hypertension   . Gout   . Anginal pain   . Shortness of breath   . GERD (gastroesophageal reflux disease)    Past Surgical History  Procedure Laterality Date  . Cholecystectomy     Social History:  reports that she has never smoked. She has never used smokeless tobacco. She reports that she does not drink alcohol or use illicit drugs. Dependent from her daughter for care  Allergies  Allergen Reactions  . Nifedipine     REACTION: made her weak    Family History  Problem Relation Age of Onset  . Coronary artery disease    . Cancer    . Coronary artery disease Mother   . Cancer Father     Prior to Admission medications   Medication Sig Start Date End Date Taking? Authorizing Provider  allopurinol (ZYLOPRIM) 100 MG tablet Take 100 mg by mouth daily.    Yes Historical Provider, MD  atorvastatin (LIPITOR) 20 MG tablet Take 20 mg by mouth daily.     Yes Historical Provider, MD  bimatoprost (LUMIGAN) 0.03 % ophthalmic solution Place 1 drop into both eyes at bedtime.     Yes Historical Provider, MD  brimonidine (ALPHAGAN) 0.15 % ophthalmic solution Place 1 drop into both eyes 2 (two) times daily.     Yes Historical Provider, MD  brinzolamide (AZOPT) 1 % ophthalmic suspension Place  1 drop into both eyes 2 (two) times daily.     Yes Historical Provider, MD  carboxymethylcellulose (REFRESH PLUS) 0.5 % SOLN Place 1 drop into both eyes as needed.     Yes Historical Provider, MD  COLCRYS 0.6 MG tablet Take 0.6 mg by mouth 2 (two) times daily. prn 09/14/11  Yes Historical Provider, MD  doxycycline (VIBRAMYCIN) 100 MG capsule Take 100 mg by mouth.   Yes Historical Provider, MD  fluticasone (FLONASE) 50 MCG/ACT nasal spray  Place 2 sprays into the nose daily as needed for allergies.  09/02/11  Yes Historical Provider, MD  guaiFENesin (MUCINEX) 600 MG 12 hr tablet Take 1,200 mg by mouth daily as needed for congestion.   Yes Historical Provider, MD  guanFACINE (TENEX) 2 MG tablet Take 2 mg by mouth at bedtime.   Yes Historical Provider, MD  hydrALAZINE (APRESOLINE) 10 MG tablet Take 20 mg by mouth 2 (two) times daily.  02/25/12  Yes Historical Provider, MD  indomethacin (INDOCIN) 25 MG capsule Take 25-50 mg by mouth 2 (two) times daily as needed (for gout flare ups).    Yes Historical Provider, MD  Multiple Vitamin (MULTIVITAMIN WITH MINERALS) TABS Take 1 tablet by mouth daily.   Yes Historical Provider, MD  oxybutynin (DITROPAN-XL) 10 MG 24 hr tablet Take 10 mg by mouth daily as needed. For bladder control   Yes Historical Provider, MD  pilocarpine (PILOCAR) 4 % ophthalmic solution Place 1 drop into both eyes 4 (four) times daily.     Yes Historical Provider, MD  warfarin (COUMADIN) 5 MG tablet Take 5 mg by mouth every evening. Take 1 tablet on Mon, Wed, and Fri.  Take 0.5 tablet the rest of the week   Yes Historical Provider, MD   Physical Exam: Filed Vitals:   03/29/12 1445 03/29/12 1515 03/29/12 1551 03/29/12 1615  BP: 89/66 101/68 128/76 109/59  Pulse:   94 82  Temp:      TempSrc:      Resp: 23 22 27 10   SpO2:   99% 99%    BP 109/59  Pulse 82  Temp(Src) 97.8 F (36.6 C) (Axillary)  Resp 10  SpO2 99%  General Appearance:    Alert, cooperative, no distress, appears stated age  Head:    Normocephalic, without obvious abnormality, atraumatic  Eyes:    PERRL, conjunctiva/corneas clear, EOM's intact, fundi    benign, both eyes        Throat:  dry mucous membranes poor oral hygiene  Neck:   Supple, symmetrical, trachea midline, no adenopathy;    thyroid:  no enlargement/tenderness/nodules; no carotid   bruit or JVD  Back:     Symmetric, no curvature, ROM normal, no CVA tenderness  Lungs:     Clear to  auscultation bilaterally, respirations unlabored  Chest Wall:    No tenderness or deformity   Heart:    Regular rate and rhythm, S1 and S2 normal, no murmur, rub   or gallop     Abdomen:     Soft, non-tender, bowel sounds active all four quadrants,    no masses, no organomegaly        Extremities:   Extremities normal, atraumatic, no cyanosis or edema  Pulses:   2+ and symmetric all extremities  Skin:   Skin color, texture, turgor normal, no rashes or lesions  Lymph nodes:   Cervical, supraclavicular, and axillary nodes normal  Neurologic:   CNII-XII intact, flaccid in her lower extremities  Labs on Admission:  Basic Metabolic Panel:  Recent Labs Lab 03/29/12 1316  NA 133*  K 3.2*  CL 93*  CO2 24  GLUCOSE 143*  BUN 22  CREATININE 1.46*  CALCIUM 9.3   Liver Function Tests: No results found for this basename: AST, ALT, ALKPHOS, BILITOT, PROT, ALBUMIN,  in the last 168 hours No results found for this basename: LIPASE, AMYLASE,  in the last 168 hours No results found for this basename: AMMONIA,  in the last 168 hours CBC:  Recent Labs Lab 03/29/12 1316  WBC 10.7*  NEUTROABS 8.2*  HGB 11.3*  HCT 32.8*  MCV 86.1  PLT 286   Cardiac Enzymes: No results found for this basename: CKTOTAL, CKMB, CKMBINDEX, TROPONINI,  in the last 168 hours  BNP (last 3 results)  Recent Labs  02/19/12 1050  PROBNP 1951.0*   CBG: No results found for this basename: GLUCAP,  in the last 168 hours  Radiological Exams on Admission: Dg Chest 2 View  03/29/2012  *RADIOLOGY REPORT*  Clinical Data: Near syncope  CHEST - 2 VIEW  Comparison: 02/19/2012  Findings: Mild cardiac enlargement is stable.  Decreased lung volumes.  No pleural effusion or edema.  No airspace consolidation identified.  IMPRESSION:  1.  No acute cardiopulmonary abnormalities   Original Report Authenticated By: Signa Kell, M.D.     EKG: atrial fibrillation with RVR  Assessment/Plan Principal Problem:   Weakness  Due to the Acute kidney injury: - Cannot check orthostatics  she is quadriplegic, we'll go ahead and check a urinary sodium urinary creatinine. As mentioned in the history of present illness her Lasix was increased and metolazone was added back on her admission in Jan. 2014. She went to her primary care doctor and her creatinine had increased from 0.8 to 2.5 at that time her Lasix was on hold and she was sent home. - Since then she has been feeling significantly weak, this is most likely secondary to her acute kidney injury. Start her on IV fluids place a fairly strict I.'s and O.'s daily weights. Check a basic metabolic panel in the morning. Also that her leukocytosis is prominence to acute kidney injury.nephrotoxic drugs - stop Indocin she's also taking home which may be contributing to her acute kidney injury.   Atrial fibrillation with rapid ventricular response and elevated troponins: - converted back to sinus rhythm most likely paroxysmal A. Fib. We'll go ahead and limited to telemetry. The elevation of her cardiac enzymes is most likely secondary to her RVR. Also continue cycle troponins. I am assuming that now that she is in normal sinus rhythm and her heart rate is under 100 this will improve.  HYPERTENSION, HEART CONTROLLED W/O ASSOC CHF - Her blood pressure medications including her hydralazine.  Chronic anticoagulation - Coumadin per pharmacy.  Hypokalemia: - We will replete her potassium check magnesium and monitor.  Code Status: resume full code Family Communication: daughter Disposition Plan: inpatient  Time spent: 60 minutes  Marinda Elk [facility] Pager [phone]  If 7PM-7AM, please contact night-coverage [email or web address] Password [facility] 03/29/2012, 5:18 PM

## 2012-03-29 NOTE — ED Notes (Signed)
Pt's family member asking "for the IV nurse to draw any blood my mother needs because the last time we were here the nurses stuck her 8 times before the IV nurse was called and her arm is just now starting to heal up for her being stuck so many times"; RN notified

## 2012-03-29 NOTE — Consult Note (Signed)
Referring Physician: Dr Ignacia Palma Primary Physician: Laurena Slimmer, MD Primary Cardiologist: Dr Graciela Husbands Reason for Consultation: Atrial fib, RVR  HPI: Veronica Powers is an 79 year old female with multiple medical problems including permanent atrial fibrillation and moderate AS. She is followed by Dr Graciela Husbands (he saw her 03/08/2012) and on chronic coumadin since her CVA.  She last saw Dr. Graciela Husbands a in September of 2013 when her lopressor (50 bid) was stopped due to severe bradycardia. She was not felt to need a pacemaker at that time.  Over past few months she has been extremely weak and essentially bedbound.  She was seen by Dr Chestine Spore today. Her heart rate was noted to be elevated in the 130s and 140s with a pressure of 89/66 and she was sent to the ER for evaluation and treatment. She is found to have chronic renal failure and persistent hypokalemia. We are asked to see her for her AF with RVR.  In the ER her HR is now in the 90s without intervention. She denies CP, resting dyspnea, palpitations or syncope.     Review of Systems:    Cardiac Review of Systems: {Y] = yes [ ]  = no  Chest Pain [    ]  Resting SOB [   ] Exertional SOB  [  ]  Orthopnea [  ]   Pedal Edema [   ]    Palpitations [ rare ] Syncope  [  ]   Presyncope [   ]  General Review of Systems: [Y] = yes [  ]=no Constitional: recent weight change [  ]; anorexia [  ]; fatigue [ y ]; nausea [ y ]; night sweats [  ]; fever [  ]; or chills [  ];                                                                                                                                          Dental: poor dentition[ y ];   Eye : blurred vision [ y ]; diplopia [   ]; vision changes [  ];  Amaurosis fugax[  ]; Resp: cough [  ];  wheezing[  ];  hemoptysis[  ]; shortness of breath[  ]; paroxysmal nocturnal dyspnea[  ]; dyspnea on exertion[ y ]; or orthopnea[  ];  GI:  gallstones[  ], vomiting[ n ];  dysphagia[  ]; melena[  ];  hematochezia [  ]; heartburn[   ];   Hx of  Colonoscopy[  ]; GU: kidney stones [  ]; hematuria[  ];   dysuria [  ];  nocturia[  ];  history of     obstruction [  ];                 Skin: rash, swelling[  ];, hair loss[  ];  peripheral edema[  ];  or itching[  ]; Musculosketetal: myalgias[  ];  joint swelling[  ];  joint erythema[  ];  joint pain[  ];  back pain[  ];  Heme/Lymph: bruising[  ];  bleeding[  ];  anemia[  ];  Neuro: TIA[  ];  headaches[  ];  stroke[ y ];  vertigo[  ];  seizures[  ];   paresthesias[  ];  difficulty walking[ y ];  Psych:depression[  ]; anxiety[  ];  Endocrine: diabetes[  ];  thyroid dysfunction[  ];  Immunizations: Flu [  ]; Pneumococcal[  ];  Other:  Past Medical History  Diagnosis Date  . Atrial fibrillation     permanent  . Atrial fibrillation with controlled ventricular response   . Aortic stenosis, moderate   . Diabetes     type 2  . Stroke     left sided residual  . CHF (congestive heart failure)   . Hypertension   . Gout   . Anginal pain   . Shortness of breath   . GERD (gastroesophageal reflux disease)    Past Surgical History  Procedure Laterality Date  . Cholecystectomy     Infusions: . sodium chloride    . diltiazem (CARDIZEM) infusion    . diltiazem     Allergies  Allergen Reactions  . Nifedipine     REACTION: made her weak   Prior to Admission medications   Medication Sig Start Date End Date Taking? Authorizing Provider  allopurinol (ZYLOPRIM) 100 MG tablet Take 100 mg by mouth daily.    Yes Historical Provider, MD  atorvastatin (LIPITOR) 20 MG tablet Take 20 mg by mouth daily.     Yes Historical Provider, MD  bimatoprost (LUMIGAN) 0.03 % ophthalmic solution Place 1 drop into both eyes at bedtime.     Yes Historical Provider, MD  brimonidine (ALPHAGAN) 0.15 % ophthalmic solution Place 1 drop into both eyes 2 (two) times daily.     Yes Historical Provider, MD  brinzolamide (AZOPT) 1 % ophthalmic suspension Place 1 drop into both eyes 2 (two) times daily.      Yes Historical Provider, MD  carboxymethylcellulose (REFRESH PLUS) 0.5 % SOLN Place 1 drop into both eyes as needed.     Yes Historical Provider, MD  COLCRYS 0.6 MG tablet Take 0.6 mg by mouth 2 (two) times daily. prn 09/14/11  Yes Historical Provider, MD  doxycycline (VIBRAMYCIN) 100 MG capsule Take 100 mg by mouth.   Yes Historical Provider, MD  fluticasone (FLONASE) 50 MCG/ACT nasal spray Place 2 sprays into the nose daily as needed for allergies.  09/02/11  Yes Historical Provider, MD  guaiFENesin (MUCINEX) 600 MG 12 hr tablet Take 1,200 mg by mouth daily as needed for congestion.   Yes Historical Provider, MD  guanFACINE (TENEX) 2 MG tablet Take 2 mg by mouth at bedtime.   Yes Historical Provider, MD  hydrALAZINE (APRESOLINE) 10 MG tablet Take 20 mg by mouth 2 (two) times daily.  02/25/12  Yes Historical Provider, MD  indomethacin (INDOCIN) 25 MG capsule Take 25-50 mg by mouth 2 (two) times daily as needed (for gout flare ups).    Yes Historical Provider, MD  Multiple Vitamin (MULTIVITAMIN WITH MINERALS) TABS Take 1 tablet by mouth daily.   Yes Historical Provider, MD  oxybutynin (DITROPAN-XL) 10 MG 24 hr tablet Take 10 mg by mouth daily as needed. For bladder control   Yes Historical Provider, MD  pilocarpine (PILOCAR) 4 % ophthalmic solution Place 1 drop into both eyes 4 (four) times daily.     Yes  Historical Provider, MD  warfarin (COUMADIN) 5 MG tablet Take 5 mg by mouth every evening. Take 1 tablet on Mon, Wed, and Fri.  Take 0.5 tablet the rest of the week   Yes Historical Provider, MD    History   Social History  . Marital Status: Widowed    Spouse Name: N/A    Number of Children: N/A  . Years of Education: N/A   Occupational History  . RETIRED    Social History Main Topics  . Smoking status: Never Smoker   . Smokeless tobacco: Never Used  . Alcohol Use: No  . Drug Use: No  . Sexually Active: Not Currently   Other Topics Concern  . Not on file   Social History Narrative    . Pt lives with daughter, has help daily and good family assistance with ADLs.   Family History  Problem Relation Age of Onset  . Coronary artery disease    . Cancer     Family Status  Relation Status Death Age  . Mother Deceased 32    Possible CAD  . Father Deceased     No further info available  . Brother Deceased 57s    Hx CAD & DM   PHYSICAL EXAM: Filed Vitals:   03/29/12 1242  BP: 98/60  Pulse: 63  Temp: 97.8 F (36.6 C)   General:  Frail elderly female. No respiratory difficulty HEENT: normal except chronic left facial droop Neck: supple. No obvious JVD,  Carotids 2+ bilat; no bruits. No lymphadenopathy or thryomegaly appreciated. Cor: PMI nondisplaced. Irregular rate & rhythm. No rubs, gallops; 2/6 RUSB murmur. Has S1 & S2 (s2 is crisp) Lungs: clear except a few rales Abdomen: soft, nontender, nondistended. No hepatosplenomegaly. No bruits or masses. Good bowel sounds. Extremities: no cyanosis, clubbing, rash, no edema. LE pulses are weak but present, UE pulses are 2+ bilat Neuro: alert & oriented x 3, cranial nerves grossly intact. moves right side extremities w/o difficulty. Left arm/leg are weak - possibly worse than previous but pt denies a sudden change. Affect pleasant.  Echocardiogram: 02/20/2012 - Left ventricle: Wall thickness was increased in a pattern of moderate LVH. Systolic function was moderately reduced. The estimated ejection fraction was in the range of 40% to 50%. Diffuse hypokinesis. - Aortic valve: There was moderate to severe stenosis. Mild regurgitation. Valve area: 1.16cm^2(VTI). Valve area: 1.09cm^2 (Vmax). Mean gradient 5mm HG - Mitral valve: Calcified annulus. Mildly thickened leaflets . Mild regurgitation. - Left atrium: The atrium was moderately to severely dilated. - Right ventricle: The cavity size was mildly dilated. Systolic function was mildly reduced.   EF 60% in October 2010.  ECG: 29-Mar-2012 12:54:41 Atrial fibrillation  with rapid ventricular response Left anterior fascicular block Left ventricular hypertrophy Lateral infarct , age undetermined Vent. rate 161 BPM PR interval * Veronica QRS duration 86 Veronica QT/QTc 300/491 Veronica P-R-T axes * -68 119  Results for orders placed during the hospital encounter of 03/29/12 (from the past 24 hour(s))  CBC WITH DIFFERENTIAL     Status: Abnormal   Collection Time    03/29/12  1:16 PM      Result Value Range   WBC 10.7 (*) 4.0 - 10.5 K/uL   RBC 3.81 (*) 3.87 - 5.11 MIL/uL   Hemoglobin 11.3 (*) 12.0 - 15.0 g/dL   HCT 16.1 (*) 09.6 - 04.5 %   MCV 86.1  78.0 - 100.0 fL   MCH 29.7  26.0 - 34.0 pg   MCHC  34.5  30.0 - 36.0 g/dL   RDW 56.2 (*) 13.0 - 86.5 %   Platelets 286  150 - 400 K/uL   Neutrophils Relative 77  43 - 77 %   Neutro Abs 8.2 (*) 1.7 - 7.7 K/uL   Lymphocytes Relative 18  12 - 46 %   Lymphs Abs 2.0  0.7 - 4.0 K/uL   Monocytes Relative 3  3 - 12 %   Monocytes Absolute 0.4  0.1 - 1.0 K/uL   Eosinophils Relative 1  0 - 5 %   Eosinophils Absolute 0.1  0.0 - 0.7 K/uL   Basophils Relative 0  0 - 1 %   Basophils Absolute 0.0  0.0 - 0.1 K/uL  BASIC METABOLIC PANEL     Status: Abnormal   Collection Time    03/29/12  1:16 PM      Result Value Range   Sodium 133 (*) 135 - 145 mEq/L   Potassium 3.2 (*) 3.5 - 5.1 mEq/L   Chloride 93 (*) 96 - 112 mEq/L   CO2 24  19 - 32 mEq/L   Glucose, Bld 143 (*) 70 - 99 mg/dL   BUN 22  6 - 23 mg/dL   Creatinine, Ser 7.84 (*) 0.50 - 1.10 mg/dL   Calcium 9.3  8.4 - 69.6 mg/dL   GFR calc non Af Amer 32 (*) >90 mL/min   GFR calc Af Amer 37 (*) >90 mL/min  POCT I-STAT TROPONIN I     Status: Abnormal   Collection Time    03/29/12  1:32 PM      Result Value Range   Troponin i, poc 0.55 (*) 0.00 - 0.08 ng/mL   Comment NOTIFIED PHYSICIAN     Comment 3           PROTIME-INR     Status: Abnormal   Collection Time    03/29/12  1:37 PM      Result Value Range   Prothrombin Time 19.1 (*) 11.6 - 15.2 seconds   INR 1.66 (*) 0.00 -  1.49  APTT     Status: None   Collection Time    03/29/12  1:37 PM      Result Value Range   aPTT 37  24 - 37 seconds   Dg Chest 2 View  03/29/2012  *RADIOLOGY REPORT*  Clinical Data: Near syncope  CHEST - 2 VIEW  Comparison: 02/19/2012  Findings: Mild cardiac enlargement is stable.  Decreased lung volumes.  No pleural effusion or edema.  No airspace consolidation identified.  IMPRESSION:  1.  No acute cardiopulmonary abnormalities   Original Report Authenticated By: Signa Kell, M.D.      ASSESSMENT: 1. Atrial fibrillation with rapid ventricular response  2.  HYPERTENSION, HEART CONTROLLED W/O ASSOC CHF 3.  AORTIC STENOSIS 4.  Chronic anticoagulation 5.  Hypokalemia 6.  Weakness 7.  Diabetes 8. Failure to thrive 9. Chrnic renal failure  PLAN/DISCUSSION  1. Afib/RVR - pt HR has improved in the ER from arrival without Rx but goes up 130s with minimal activity. MD advise on IV Cardizem for rate control and titrate as needed. Add back low-dose BB as BP will allow. She has a history of intermittent bradycardia and will have to watch for this. Further plans per EP. 2. HTN - good control on current Rx 3. AS - see recent echo report, medical Rx 4. Chronic anticoag - INR sub-therapeutic, with hx CVA, MD advise on heparin for X-cover 5. Hypokalemia - pt  says takes meds as prescribed, has been a problem 6. Weakness - think has deconditioning from recent illness  7. DM - pt not aware of Dx but is listed on previous records, not on Rx  8. FTT - per IM.  Theodore Demark, PA-C 03/29/2012 4:05 PM   Patient seen and examined with Theodore Demark, PA-C. We discussed all aspects of the encounter. I agree with the assessment and plan as stated above. I have edited the note above to reflect my changes.  I think the major issue here is profound failure to thrive which is likely multifactorial. As such, we have asked the IM team to admit and evaluate and they have graciously accept. She may be  getting close to where a Palliative Care Consult may be in order.  From a cardiac perspective, her HR is mildly elevated and we will add back low dose lopressor at 12.5 bid and watch closely for bradycardia. I have also reviewed her echo report and although her AVA is in the moderate to severe range her gradient is quite mild. In addition her exam is not c/w severe AS so I think AS is more in the mild to moderate range.  I do not think we will have much else to offer so we will follow at a distance. Please call with specific questions.   Daniel Bensimhon,MD 5:52 PM

## 2012-03-29 NOTE — ED Notes (Signed)
1330 pt to XR

## 2012-03-29 NOTE — Progress Notes (Addendum)
ANTICOAGULATION CONSULT NOTE - Initial Consult  Pharmacy Consult for coumadin Indication: atrial fibrillation  Allergies  Allergen Reactions  . Nifedipine     REACTION: made her weak    Patient Measurements: Wt= 63kg  Vital Signs: Temp: 97.9 F (36.6 C) (03/11 1813) Temp src: Oral (03/11 1813) BP: 134/83 mmHg (03/11 1813) Pulse Rate: 91 (03/11 1813)  Labs:  Recent Labs  03/29/12 1316 03/29/12 1337  HGB 11.3*  --   HCT 32.8*  --   PLT 286  --   APTT  --  37  LABPROT  --  19.1*  INR  --  1.66*  CREATININE 1.46*  --     The CrCl is unknown because both a height and weight (above a minimum accepted value) are required for this calculation.   Medical History: Past Medical History  Diagnosis Date  . Atrial fibrillation     permanent  . Atrial fibrillation with controlled ventricular response   . Aortic stenosis, moderate   . Diabetes     type 2  . Stroke     left sided residual  . CHF (congestive heart failure)   . Hypertension   . Gout   . Anginal pain   . Shortness of breath   . GERD (gastroesophageal reflux disease)     Medications:  Prescriptions prior to admission  Medication Sig Dispense Refill  . allopurinol (ZYLOPRIM) 100 MG tablet Take 100 mg by mouth daily.       Marland Kitchen atorvastatin (LIPITOR) 20 MG tablet Take 20 mg by mouth daily.        . bimatoprost (LUMIGAN) 0.03 % ophthalmic solution Place 1 drop into both eyes at bedtime.        . brimonidine (ALPHAGAN) 0.15 % ophthalmic solution Place 1 drop into both eyes 2 (two) times daily.        . brinzolamide (AZOPT) 1 % ophthalmic suspension Place 1 drop into both eyes 2 (two) times daily.        . carboxymethylcellulose (REFRESH PLUS) 0.5 % SOLN Place 1 drop into both eyes as needed.        . COLCRYS 0.6 MG tablet Take 0.6 mg by mouth 2 (two) times daily. prn      . doxycycline (VIBRAMYCIN) 100 MG capsule Take 100 mg by mouth.      . fluticasone (FLONASE) 50 MCG/ACT nasal spray Place 2 sprays into the  nose daily as needed for allergies.       Marland Kitchen guaiFENesin (MUCINEX) 600 MG 12 hr tablet Take 1,200 mg by mouth daily as needed for congestion.      Marland Kitchen guanFACINE (TENEX) 2 MG tablet Take 2 mg by mouth at bedtime.      . hydrALAZINE (APRESOLINE) 10 MG tablet Take 20 mg by mouth 2 (two) times daily.       . indomethacin (INDOCIN) 25 MG capsule Take 25-50 mg by mouth 2 (two) times daily as needed (for gout flare ups).       . Multiple Vitamin (MULTIVITAMIN WITH MINERALS) TABS Take 1 tablet by mouth daily.      Marland Kitchen oxybutynin (DITROPAN-XL) 10 MG 24 hr tablet Take 10 mg by mouth daily as needed. For bladder control      . pilocarpine (PILOCAR) 4 % ophthalmic solution Place 1 drop into both eyes 4 (four) times daily.        Marland Kitchen warfarin (COUMADIN) 5 MG tablet Take 5 mg by mouth every evening. Take 1 tablet on  Mon, Wed, and Fri.  Take 0.5 tablet the rest of the week        Assessment: 79 yo female here with weakness and afib (now converted to sinus). Pharmacy consulted to dose heparin and warfarin.  Patient is on coumadin PTA for PAF and to continue while admitted. INR today is 1.66 with last dose given 03/28/12.  H/H low but platelets WNL.  Home dose: 5mg  MWF, 2.5mg  TTSS  Goal of Therapy:  INR 2-3 Monitor platelets by anticoagulation protocol: Yes Heparin level 0.3-0.7   Plan:  -Coumadin 5mg  po today  -Daily PT/INR  Harland German, Pharm D 03/29/2012 7:49 PM   9:29 PM Order to initiate heparin released significantly after order to start warfarin.  Will begin heparin at 850 units/hr with a 2000 units/hr bolus. Check heparin level 8 h after ggt starts.  Daily heparin level and cbc.   Thank you for allowing pharmacy to be a part of this patients care team.  Lovenia Kim Pharm.D., BCPS Clinical Pharmacist 03/29/2012 9:38 PM Pager: [phone] Phone: [phone]  Per MD no heparin bolus.  Heparin bolus DCd.  Juliette Christine Virginia Crews

## 2012-03-29 NOTE — ED Notes (Signed)
Patient placed on bed pan.  Has not urinated yet.

## 2012-03-29 NOTE — ED Provider Notes (Addendum)
History     CSN: [id number]  Arrival date & time 03/29/12  1232   First MD Initiated Contact with Patient 03/29/12 1309      Chief Complaint  Patient presents with  . Hypotension  . Weakness    (Consider location/radiation/quality/duration/timing/severity/associated sxs/prior treatment) HPI Comments: Patient is a 79 year old woman who has been sick for about a month. Her potassium had been low and she had been prescribed oral potassium by her internist, [name] M.D. She was seen in the office today by Dr. Chestine Spore, was noted to have rapid atrial fibrillation, and was sent to the ED for evaluation and treatment. The patient has had no chest pain she has had some shortness of breath has been no vomiting or diarrhea, no dysuria, no rash, and no syncope.  Patient is a 79 y.o. female presenting with weakness. The history is provided by the patient and a relative. No language interpreter was used.  Weakness This is a new problem. Episode onset: One month. The problem occurs constantly. The problem has been gradually worsening. Associated symptoms include shortness of breath. Pertinent negatives include no chest pain, no abdominal pain and no headaches. Nothing aggravates the symptoms. Nothing relieves the symptoms. She has tried nothing for the symptoms.    Past Medical History  Diagnosis Date  . Atrial fibrillation     permanent  . Atrial fibrillation with controlled ventricular response   . Aortic stenosis, moderate   . Diabetes mellitus     type 2  . Stroke     left sided residual  . CHF (congestive heart failure)   . Hypertension   . Gout   . Anginal pain   . Shortness of breath   . GERD (gastroesophageal reflux disease)     No past surgical history on file.  Family History  Problem Relation Age of Onset  . Coronary artery disease    . Cancer      History  Substance Use Topics  . Smoking status: Never Smoker   . Smokeless tobacco: Never Used  . Alcohol Use: No     OB History   Grav Para Term Preterm Abortions TAB SAB Ect Mult Living                  Review of Systems  Constitutional: Negative for fever and chills.  HENT: Negative.   Eyes: Negative.   Respiratory: Positive for shortness of breath.   Cardiovascular: Positive for palpitations. Negative for chest pain and leg swelling.  Gastrointestinal: Negative.  Negative for abdominal pain.  Genitourinary: Negative.   Musculoskeletal: Negative.   Skin: Negative.   Neurological: Positive for weakness. Negative for headaches.  Psychiatric/Behavioral: Negative.     Allergies  Nifedipine  Home Medications   Current Outpatient Rx  Name  Route  Sig  Dispense  Refill  . allopurinol (ZYLOPRIM) 100 MG tablet   Oral   Take 100 mg by mouth 2 (two) times daily.          Marland Kitchen amoxicillin-clavulanate (AUGMENTIN) 875-125 MG per tablet   Oral   Take 1 tablet by mouth every 12 (twelve) hours. For 7 days then stop.   14 tablet   0   . atorvastatin (LIPITOR) 20 MG tablet   Oral   Take 20 mg by mouth daily.           . benazepril (LOTENSIN) 20 MG tablet   Oral   Take 0.5 tablets (10 mg total) by mouth daily.         Marland Kitchen  bimatoprost (LUMIGAN) 0.03 % ophthalmic solution   Both Eyes   Place 1 drop into both eyes at bedtime.           . brimonidine (ALPHAGAN) 0.15 % ophthalmic solution   Both Eyes   Place 1 drop into both eyes 2 (two) times daily.           . brinzolamide (AZOPT) 1 % ophthalmic suspension   Both Eyes   Place 1 drop into both eyes 2 (two) times daily.           . carboxymethylcellulose (REFRESH PLUS) 0.5 % SOLN   Both Eyes   Place 1 drop into both eyes as needed.           . COLCRYS 0.6 MG tablet   Oral   Take 0.6 mg by mouth 2 (two) times daily. prn         . fluticasone (FLONASE) 50 MCG/ACT nasal spray   Nasal   Place 2 sprays into the nose daily. prn         . furosemide (LASIX) 40 MG tablet   Oral   Take 40 mg by mouth daily.          Marland Kitchen  guanFACINE (TENEX) 2 MG tablet   Oral   Take 2 mg by mouth at bedtime.         . indomethacin (INDOCIN) 25 MG capsule   Oral   Take 25-50 mg by mouth 2 (two) times daily with a meal. For gout flare ups         . isosorbide-hydrALAZINE (BIDIL) 20-37.5 MG per tablet   Oral   Take 1 tablet by mouth 2 (two) times daily.         . metoprolol tartrate (LOPRESSOR) 25 MG tablet   Oral   Take 1 tablet (25 mg total) by mouth 2 (two) times daily.   60 tablet   3   . oxybutynin (DITROPAN-XL) 10 MG 24 hr tablet   Oral   Take 10 mg by mouth daily as needed. For bladder control         . pilocarpine (PILOCAR) 4 % ophthalmic solution   Both Eyes   Place 1 drop into both eyes 4 (four) times daily.           . potassium chloride (K-DUR) 10 MEQ tablet   Oral   Take 2 tablets (20 mEq total) by mouth daily.   60 tablet   3   . warfarin (COUMADIN) 5 MG tablet      HOLD Coumadin tonight. Starting Tuesday, 02/23/2012, take 1/2 tablet once daily.   30 tablet   3     BP 98/60  Pulse 63  Temp(Src) 97.8 F (36.6 C) (Axillary)  SpO2 97%  Physical Exam  Nursing note and vitals reviewed. Constitutional: She is oriented to person, place, and time.  Slender elderly lady, in no distress at rest. Heart rate is about 160.  HENT:  Head: Normocephalic and atraumatic.  Right Ear: External ear normal.  Left Ear: External ear normal.  Mouth/Throat: Oropharynx is clear and moist.  Eyes: Conjunctivae and EOM are normal. Pupils are equal, round, and reactive to light.  Neck: Normal range of motion. Neck supple.  Cardiovascular:  Rapid heart rate approximately 160.  Pulmonary/Chest: Effort normal and breath sounds normal.  Abdominal: Soft. Bowel sounds are normal.  Musculoskeletal: Normal range of motion. She exhibits no edema and no tenderness.  Neurological: She is alert and oriented  to person, place, and time.  No sensory or motor deficit.  Skin: Skin is warm and dry.  Psychiatric:  She has a normal mood and affect. Her behavior is normal.    ED Course  Procedures (including critical care time)  Labs Reviewed  CBC WITH DIFFERENTIAL  BASIC METABOLIC PANEL  URINALYSIS, ROUTINE W REFLEX MICROSCOPIC    Date: 03/29/2012  Rate: 161  Rhythm: atrial fibrillation with rapid rate  QRS Axis: left  Intervals: QT prolonged QRS:  Poor R wave progression in lateral precordial leads suggests old lateral myocardial infarction.  Left ventricular hypertrophy.  ST/T Wave abnormalities: normal  Conduction Disutrbances:left anterior fascicular block  Narrative Interpretation: Abnormal EKG  Old EKG Reviewed: changes noted--rate more rapid than on 02/20/2012.  1:39 PM Pt was seen and had physical examination.  EKG showed rapid atrial fibrillation.  IV fluids, IV Cardizem bolus and continuous infusion were ordered.  Lab workup was ordered.  2:49 PM Results for orders placed during the hospital encounter of 03/29/12  CBC WITH DIFFERENTIAL      Result Value Range   WBC 10.7 (*) 4.0 - 10.5 K/uL   RBC 3.81 (*) 3.87 - 5.11 MIL/uL   Hemoglobin 11.3 (*) 12.0 - 15.0 g/dL   HCT 16.1 (*) 09.6 - 04.5 %   MCV 86.1  78.0 - 100.0 fL   MCH 29.7  26.0 - 34.0 pg   MCHC 34.5  30.0 - 36.0 g/dL   RDW 40.9 (*) 81.1 - 91.4 %   Platelets 286  150 - 400 K/uL   Neutrophils Relative 77  43 - 77 %   Neutro Abs 8.2 (*) 1.7 - 7.7 K/uL   Lymphocytes Relative 18  12 - 46 %   Lymphs Abs 2.0  0.7 - 4.0 K/uL   Monocytes Relative 3  3 - 12 %   Monocytes Absolute 0.4  0.1 - 1.0 K/uL   Eosinophils Relative 1  0 - 5 %   Eosinophils Absolute 0.1  0.0 - 0.7 K/uL   Basophils Relative 0  0 - 1 %   Basophils Absolute 0.0  0.0 - 0.1 K/uL  BASIC METABOLIC PANEL      Result Value Range   Sodium 133 (*) 135 - 145 mEq/L   Potassium 3.2 (*) 3.5 - 5.1 mEq/L   Chloride 93 (*) 96 - 112 mEq/L   CO2 24  19 - 32 mEq/L   Glucose, Bld 143 (*) 70 - 99 mg/dL   BUN 22  6 - 23 mg/dL   Creatinine, Ser 7.82 (*) 0.50 - 1.10  mg/dL   Calcium 9.3  8.4 - 95.6 mg/dL   GFR calc non Af Amer 32 (*) >90 mL/min   GFR calc Af Amer 37 (*) >90 mL/min  PROTIME-INR      Result Value Range   Prothrombin Time 19.1 (*) 11.6 - 15.2 seconds   INR 1.66 (*) 0.00 - 1.49  APTT      Result Value Range   aPTT 37  24 - 37 seconds  POCT I-STAT TROPONIN I      Result Value Range   Troponin i, poc 0.55 (*) 0.00 - 0.08 ng/mL   Comment NOTIFIED PHYSICIAN     Comment 3            Dg Chest 2 View  03/29/2012  *RADIOLOGY REPORT*  Clinical Data: Near syncope  CHEST - 2 VIEW  Comparison: 02/19/2012  Findings: Mild cardiac enlargement is stable.  Decreased lung  volumes.  No pleural effusion or edema.  No airspace consolidation identified.  IMPRESSION:  1.  No acute cardiopulmonary abnormalities   Original Report Authenticated By: Signa Kell, M.D.     2:50 PM Course in ED.  patient was seen and had physical examination. Her EKG showed have atrial fibrillation 160. She was given intravenous Cardizem bolus and continuous infusion. Laboratory tests were done. Her troponin I was mildly elevated at 0.55. Her INR was 1.66, subtherapeutic. With the Cardizem treatment, her rate dropped into the 90s, though she remains in atrial fibrillation. Her daughter says that her cardiologist is Dr. Graciela Husbands. Therefore the [facility] cardiologists were called, and they will see the patient.  Informed by RN that pt had never had IV placed or received IV Cardizem.  IV RN is in room now, to get IV established.  1. Atrial fibrillation with rapid ventricular response   2. Elevated troponin I level     4:38 PM Pt was seen by [facility], who feel her atrial fibrillation is stable and that a significant part of her problem is failure to thrive.  They request that she be admitted by [facility].   4:55 PM Case discussed with Dr. Radonna Ricker --> admit to inpatient status to a telemetry bed to Team 2.    Carleene Cooper III, MD 03/29/12 1503    Carleene Cooper  III, MD 03/29/12 1642    Carleene Cooper III, MD 03/29/12 [phone]

## 2012-03-30 ENCOUNTER — Encounter (HOSPITAL_COMMUNITY): Payer: Self-pay | Admitting: General Practice

## 2012-03-30 LAB — CBC
MCH: 29.3 pg (ref 26.0–34.0)
MCHC: 33.6 g/dL (ref 30.0–36.0)
WBC: 7 10*3/uL (ref 4.0–10.5)

## 2012-03-30 LAB — HEPARIN LEVEL (UNFRACTIONATED)
Heparin Unfractionated: 0.21 IU/mL — ABNORMAL LOW (ref 0.30–0.70)
Heparin Unfractionated: 0.56 IU/mL (ref 0.30–0.70)

## 2012-03-30 LAB — URINALYSIS, ROUTINE W REFLEX MICROSCOPIC
Nitrite: NEGATIVE
Specific Gravity, Urine: 1.014 (ref 1.005–1.030)
Urobilinogen, UA: 0.2 mg/dL (ref 0.0–1.0)
pH: 5 (ref 5.0–8.0)

## 2012-03-30 LAB — COMPREHENSIVE METABOLIC PANEL
Alkaline Phosphatase: 64 U/L (ref 39–117)
BUN: 22 mg/dL (ref 6–23)
CO2: 21 mEq/L (ref 19–32)
Chloride: 101 mEq/L (ref 96–112)
GFR calc Af Amer: 47 mL/min — ABNORMAL LOW (ref >90)
GFR calc non Af Amer: 41 mL/min — ABNORMAL LOW (ref >90)
Glucose, Bld: 97 mg/dL (ref 70–99)
Potassium: 3.1 mEq/L — ABNORMAL LOW (ref 3.5–5.1)
Total Bilirubin: 0.8 mg/dL (ref 0.3–1.2)

## 2012-03-30 LAB — GLUCOSE, CAPILLARY
Glucose-Capillary: 105 mg/dL — ABNORMAL HIGH (ref 70–99)
Glucose-Capillary: 170 mg/dL — ABNORMAL HIGH (ref 70–99)

## 2012-03-30 LAB — PROTIME-INR
INR: 1.84 — ABNORMAL HIGH (ref 0.00–1.49)
Prothrombin Time: 20.6 seconds — ABNORMAL HIGH (ref 11.6–15.2)

## 2012-03-30 LAB — SODIUM, URINE, RANDOM: Sodium, Ur: 29 mEq/L

## 2012-03-30 MED ORDER — METOPROLOL TARTRATE 12.5 MG HALF TABLET
12.5000 mg | ORAL_TABLET | Freq: Two times a day (BID) | ORAL | Status: DC
  Administered 2012-03-31 (×2): 12.5 mg via ORAL
  Filled 2012-03-30 (×3): qty 1

## 2012-03-30 MED ORDER — CHLORHEXIDINE GLUCONATE CLOTH 2 % EX PADS
6.0000 | MEDICATED_PAD | Freq: Every day | CUTANEOUS | Status: DC
  Administered 2012-03-30 – 2012-03-31 (×2): 6 via TOPICAL

## 2012-03-30 MED ORDER — POTASSIUM CHLORIDE CRYS ER 20 MEQ PO TBCR
40.0000 meq | EXTENDED_RELEASE_TABLET | Freq: Once | ORAL | Status: AC
  Administered 2012-03-30: 40 meq via ORAL
  Filled 2012-03-30: qty 2

## 2012-03-30 MED ORDER — WARFARIN - PHARMACIST DOSING INPATIENT: Freq: Every day | Status: DC

## 2012-03-30 MED ORDER — WARFARIN SODIUM 5 MG PO TABS
5.0000 mg | ORAL_TABLET | Freq: Once | ORAL | Status: AC
  Administered 2012-03-30: 5 mg via ORAL
  Filled 2012-03-30: qty 1

## 2012-03-30 MED ORDER — MUPIROCIN 2 % EX OINT
1.0000 "application " | TOPICAL_OINTMENT | Freq: Two times a day (BID) | CUTANEOUS | Status: DC
  Administered 2012-03-30 – 2012-04-03 (×9): 1 via NASAL
  Filled 2012-03-30: qty 22

## 2012-03-30 MED ORDER — COLCHICINE 0.6 MG PO TABS
0.6000 mg | ORAL_TABLET | Freq: Every day | ORAL | Status: DC | PRN
  Filled 2012-03-30: qty 1

## 2012-03-30 NOTE — Progress Notes (Addendum)
ANTICOAGULATION CONSULT NOTE - Follow Up Consult  Pharmacy Consult for Heparin and Coumadin Indication: atrial fibrillation  Allergies  Allergen Reactions  . Nifedipine     REACTION: made her weak    Patient Measurements: Height: 4\' 11"  (149.9 cm) Weight: 138 lb 11.2 oz (62.914 kg) IBW/kg (Calculated) : 43.2 Heparin Dosing Weight: 63 kg  Vital Signs: Temp: 97.4 F (36.3 C) (03/12 1300) Temp src: Oral (03/12 1300) BP: 147/87 mmHg (03/12 1300) Pulse Rate: 75 (03/12 1300)  Labs:  Recent Labs  03/29/12 1316 03/29/12 1337 03/29/12 1936 03/30/12 0038 03/30/12 0730 03/30/12 0731 03/30/12 1649  HGB 11.3*  --   --   --  8.4*  --   --   HCT 32.8*  --   --   --  25.0*  --   --   PLT 286  --   --   --  274  --   --   APTT  --  37  --   --   --   --   --   LABPROT  --  19.1*  --   --  20.6*  --   --   INR  --  1.66*  --   --  1.84*  --   --   HEPARINUNFRC  --   --   --   --  0.21*  --  0.43  CREATININE 1.46*  --   --   --  1.19*  --   --   TROPONINI  --   --  6.85* 5.55*  --  5.02*  --     Estimated Creatinine Clearance: 28.4 ml/min (by C-G formula based on Cr of 1.19).  Assessment:   Heparin bridge /coumadin.  8 hr heparin level 0.43 on 1050 units/hr IV heparin infusion. Level is therapeutic. Hgb dropped ~3 grams. No bleeding noted currently. Dr. Rito Ehrlich notes today this is likely dilutional.  No overt bleeding.  Checking FOBT and monitoring Hgb closely.     Goal of Therapy:  INR 2-3 Heparin level 0.3-0.7 units/ml Monitor platelets by anticoagulation protocol: Yes   Plan:  Continue heparin to 1050 units/hr. Heparin level in 6 hrs to confirm therapeutic.  Heparin level, INR  and CBC daily in AM.  Coumadin as previously ordered today. Monitor for any signs of bleeding. Add GI prophylaxis? Consider decreasing Colchicine dose to  Daily PRN (on bid prn PTA) or daily at most. .  Noah Delaine, RPh Clinical Pharmacist Pager: [phone] 03/30/2012,5:39 PM

## 2012-03-30 NOTE — Progress Notes (Signed)
[facility] PROGRESS NOTE  Veronica Powers MVH:[id number] DOB: [birth year] DOA: 03/29/2012  PCP: Veronica Slimmer, MD  Brief HPI: Veronica Powers is a 79 y.o. female with a past medical history of chronic diastolic heart failure,paroxysmal A. Fib currently on Coumadin recently discharge from the hospital on January 2014 right parotitis. During that admission her Lasix was increased. She went to see her primary care doctor and at that time her potassium was 6.0 her creatinine was 2.5. She was feeling weak. She also had paroxysmal A. Fib in the 160s. In the emergency room she was given diltiazem and she converted back into sinus rhythm. Patient the patient denied any shortness of breath chest pain or swelling. She relates her urine is very concentrated and she is thirsty all the time.   Past medical history:  Past Medical History  Diagnosis Date  . Atrial fibrillation     permanent  . Atrial fibrillation with controlled ventricular response   . Aortic stenosis, moderate   . Diabetes     type 2  . Stroke     left sided residual  . CHF (congestive heart failure)   . Hypertension   . Gout   . Anginal pain   . Shortness of breath   . GERD (gastroesophageal reflux disease)     Consultants: [facility] Cards  Procedures: None  Antibiotics: None  Subjective: Patient feels better. No longer weak. Denies any chest pain. Has chronic shortness of breath. No abdominal pain.  Objective: Vital Signs  Filed Vitals:   03/29/12 1813 03/29/12 2100 03/30/12 0500 03/30/12 1300  BP: 134/83 112/73 144/89 147/87  Pulse: 91 89 76 75  Temp: 97.9 F (36.6 C) 97.4 F (36.3 C) 98.2 F (36.8 C) 97.4 F (36.3 C)  TempSrc: Oral Oral Oral Oral  Resp: 18 17 17 18   Height:  4\' 11"  (1.499 m)    Weight:  63.05 kg (139 lb) 62.914 kg (138 lb 11.2 oz)   SpO2: 98% 98% 100% 98%    Intake/Output Summary (Last 24 hours) at 03/30/12 1546 Last data filed at 03/30/12 0908  Gross per 24 hour  Intake 2163.09 ml   Output      0 ml  Net 2163.09 ml   Filed Weights   03/29/12 2100 03/30/12 0500  Weight: 63.05 kg (139 lb) 62.914 kg (138 lb 11.2 oz)    General appearance: alert, cooperative, appears stated age and no distress Throat: lips, mucosa, and tongue normal; teeth and gums normal Resp: clear to auscultation bilaterally Cardio: regular rate and rhythm, S1, S2 normal, no murmur, click, rub or gallop GI: soft, non-tender; bowel sounds normal; no masses,  no organomegaly Extremities: extremities normal, atraumatic, no cyanosis or edema Pulses: 2+ and symmetric  Lab Results:  Basic Metabolic Panel:  Recent Labs Lab 03/29/12 1316 03/30/12 0730  NA 133* 137  K 3.2* 3.1*  CL 93* 101  CO2 24 21  GLUCOSE 143* 97  BUN 22 22  CREATININE 1.46* 1.19*  CALCIUM 9.3 8.3*   Liver Function Tests:  Recent Labs Lab 03/30/12 0730  AST 29  ALT 22  ALKPHOS 64  BILITOT 0.8  PROT 6.2  ALBUMIN 2.5*   CBC:  Recent Labs Lab 03/29/12 1316 03/30/12 0730  WBC 10.7* 7.0  NEUTROABS 8.2*  --   HGB 11.3* 8.4*  HCT 32.8* 25.0*  MCV 86.1 87.1  PLT 286 274   Cardiac Enzymes:  Recent Labs Lab 03/29/12 1936 03/30/12 0038 03/30/12 0731  TROPONINI 6.85* 5.55* 5.02*  BNP (last 3 results)  Recent Labs  02/19/12 1050  PROBNP 1951.0*   CBG:  Recent Labs Lab 03/29/12 2014 03/30/12 0808 03/30/12 1243  GLUCAP 97 89 105*    Recent Results (from the past 240 hour(s))  MRSA PCR SCREENING     Status: Abnormal   Collection Time    03/29/12  8:29 PM      Result Value Range Status   MRSA by PCR POSITIVE (*) NEGATIVE Final   Comment:            The GeneXpert MRSA Assay (FDA     approved for NASAL specimens     only), is one component of a     comprehensive MRSA colonization     surveillance program. It is not     intended to diagnose MRSA     infection nor to guide or     monitor treatment for     MRSA infections.     RESULT CALLED TO, READ BACK BY AND VERIFIED WITH:     Johnnette Litter RN [id number] 03/29/12 A BROWNING      Studies/Results: Dg Chest 2 View  03/29/2012  *RADIOLOGY REPORT*  Clinical Data: Near syncope  CHEST - 2 VIEW  Comparison: 02/19/2012  Findings: Mild cardiac enlargement is stable.  Decreased lung volumes.  No pleural effusion or edema.  No airspace consolidation identified.  IMPRESSION:  1.  No acute cardiopulmonary abnormalities   Original Report Authenticated By: Signa Kell, M.D.     Medications:  Scheduled: . atorvastatin  20 mg Oral Daily  . brimonidine  1 drop Both Eyes BID  . brinzolamide  1 drop Both Eyes BID  . Chlorhexidine Gluconate Cloth  6 each Topical Q0600  . colchicine  0.6 mg Oral BID  . guanFACINE  2 mg Oral QHS  . multivitamin with minerals  1 tablet Oral Daily  . mupirocin ointment  1 application Nasal BID  . pilocarpine  1 drop Both Eyes QID  . potassium chloride  40 mEq Oral Once  . sodium chloride  3 mL Intravenous Q12H  . warfarin  5 mg Oral ONCE-1800  . Warfarin - Pharmacist Dosing Inpatient   Does not apply q1800   Continuous: . heparin 1,050 Units/hr (03/30/12 0900)   JXB:[id number], acetaminophen, guaiFENesin, ondansetron (ZOFRAN) IV, ondansetron, oxybutynin, polyethylene glycol, polyvinyl alcohol  Assessment/Plan:  Principal Problem:   Acute kidney injury Active Problems:   HYPERTENSION, HEART CONTROLLED W/O ASSOC CHF   AORTIC STENOSIS   Chronic anticoagulation   Hypokalemia   Weakness   Diabetes   Atrial fibrillation with rapid ventricular response    Acute kidney injury with weakness Renal function is improving. Cut back on IVF. PT/OT. Per daughter patient walks with a cane. ARf likely due to diuretics that were added to her regimen during previous hospitalization. Stopped Indocin she's also taking home which may be contributing to her acute kidney injury.   Atrial fibrillation with rapid ventricular response and elevated troponins:  No longer tachycardic. Most likely paroxysmal A. Fib.  Not on any rate limiting drugs currently. Cardiology was supposed to start Lopressor but haven't. Will initiate. Troponin did elevate significantly. Patient denies chest pain. On heparin infusion. Defer to cardiology. Unlikely she will be a candidate for further evaluation. Did have an ECHO in feb.  Anemia Acute drop in Hgb noted. Likely dilutional. No overt bleeding. Check FOBT. Monitor Hgb closely.  HYPERTENSION, HEART CONTROLLED W/O ASSOC CHF  Monitor closely.   Chronic anticoagulation  Coumadin per pharmacy. Heparin bridge.  Hypokalemia:  We will replete her potassium check magnesium and monitor.   Code Status: full code  Family Communication: daughter at bedside Disposition Plan: Unclear for now.  DVT Prophylaxis On warfarin and heparin   LOS: 1 day   [facility]  [facility] Pager [phone] 03/30/2012, 3:46 PM  If 8PM-8AM, please contact night-coverage at [email or web address], password [facility]

## 2012-03-30 NOTE — Progress Notes (Addendum)
Pt family states "pt has not peed in a while."  Pt. With NS @ 160/hr.  Pt. Bladder scanned with only 100cc via bladder scan.  Pt. Denies any discomfort or urge to void. Pt states "I don't want catheter if I can help it."  Dtr at bedside and states "well we don't want her to have it but if she needs it then do what you have to."  Will continue to monitor.

## 2012-03-30 NOTE — Progress Notes (Signed)
Pt. Still unable to void.  Pt. Denies any discomfort or urge to void.  Pt. Bladder scanned.  Pt. With 300cc urine in bladder.  Pt. Still refusing foley at this time.  Pt. States "I don't pee that often."  Will continue to monitor.

## 2012-03-30 NOTE — Progress Notes (Signed)
ANTICOAGULATION CONSULT NOTE - Follow Up Consult  Pharmacy Consult for Heparin and Coumadin Indication: atrial fibrillation  Allergies  Allergen Reactions  . Nifedipine     REACTION: made her weak    Patient Measurements: Height: 4\' 11"  (149.9 cm) Weight: 138 lb 11.2 oz (62.914 kg) IBW/kg (Calculated) : 43.2 Heparin Dosing Weight: 63 kg  Vital Signs: Temp: 98.2 F (36.8 C) (03/12 0500) Temp src: Oral (03/12 0500) BP: 144/89 mmHg (03/12 0500) Pulse Rate: 76 (03/12 0500)  Labs:  Recent Labs  03/29/12 1316 03/29/12 1337 03/29/12 1936 03/30/12 0038 03/30/12 0730 03/30/12 0731  HGB 11.3*  --   --   --  8.4*  --   HCT 32.8*  --   --   --  25.0*  --   PLT 286  --   --   --  274  --   APTT  --  37  --   --   --   --   LABPROT  --  19.1*  --   --  20.6*  --   INR  --  1.66*  --   --  1.84*  --   HEPARINUNFRC  --   --   --   --  0.21*  --   CREATININE 1.46*  --   --   --  1.19*  --   TROPONINI  --   --  6.85* 5.55*  --  5.02*    Estimated Creatinine Clearance: 28.4 ml/min (by C-G formula based on Cr of 1.19).  Assessment:   Initial heparin level is subtherapeutic on 850 units/hr.   INR increasing towards target INR 2-3 after Coumadin 5 mg dose 3/10 pm.  Home regimen:  5 mg MWF, 2.5 mg TTSS.   Hgb has dropped ~3 grams. No known bleeding.    Goal of Therapy:  INR 2-3 Heparin level 0.3-0.7 units/ml Monitor platelets by anticoagulation protocol: Yes   Plan:   Increase heparin to 1050 units/hr.  Heparin level in ~ 8 hrs.  Repeat Coumadin 5 mg today.  Continue daily heparin level, PT/INR and CBC.  Monitor for any signs of bleeding.  Add GI prophylaxis?  Consider decreasing Colchicine dose to daily or every other day.  Dennie Fetters, [place] Pager: [phone] 03/30/2012,9:07 AM

## 2012-03-30 NOTE — Progress Notes (Signed)
Pt still with no urge to void.  Pt. Agreeable to foley insertion at this time.  14 fr foley catheter inserted using aseptic technique. Pt. With 450cc straw colored urine returned to gravity.  Patient tolerated well.  Urine sent to lab for UA, random sodium, random creatinine per orders.

## 2012-03-31 LAB — CBC
HCT: 27.1 % — ABNORMAL LOW (ref 36.0–46.0)
Platelets: 294 10*3/uL (ref 150–400)
RDW: 17.3 % — ABNORMAL HIGH (ref 11.5–15.5)
WBC: 5.3 10*3/uL (ref 4.0–10.5)

## 2012-03-31 LAB — BASIC METABOLIC PANEL
Chloride: 105 mEq/L (ref 96–112)
GFR calc Af Amer: 66 mL/min — ABNORMAL LOW (ref >90)
Potassium: 3.9 mEq/L (ref 3.5–5.1)

## 2012-03-31 LAB — GLUCOSE, CAPILLARY: Glucose-Capillary: 92 mg/dL (ref 70–99)

## 2012-03-31 LAB — TROPONIN I: Troponin I: 3.01 ng/mL (ref <0.30)

## 2012-03-31 LAB — PROTIME-INR: INR: 2.06 — ABNORMAL HIGH (ref 0.00–1.49)

## 2012-03-31 MED ORDER — WARFARIN SODIUM 2.5 MG PO TABS
2.5000 mg | ORAL_TABLET | Freq: Once | ORAL | Status: AC
  Administered 2012-03-31: 2.5 mg via ORAL
  Filled 2012-03-31: qty 1

## 2012-03-31 MED ORDER — METOPROLOL TARTRATE 25 MG PO TABS
25.0000 mg | ORAL_TABLET | Freq: Two times a day (BID) | ORAL | Status: DC
  Administered 2012-03-31: 25 mg via ORAL
  Filled 2012-03-31 (×3): qty 1

## 2012-03-31 MED ORDER — METOPROLOL TARTRATE 1 MG/ML IV SOLN: 5.0000 mg | Freq: Once | INTRAVENOUS | Status: AC | PRN

## 2012-03-31 MED ORDER — ASPIRIN EC 81 MG PO TBEC
81.0000 mg | DELAYED_RELEASE_TABLET | Freq: Every day | ORAL | Status: DC
  Administered 2012-03-31 – 2012-04-03 (×4): 81 mg via ORAL
  Filled 2012-03-31 (×4): qty 1

## 2012-03-31 MED ORDER — DOCUSATE SODIUM 100 MG PO CAPS
100.0000 mg | ORAL_CAPSULE | Freq: Two times a day (BID) | ORAL | Status: DC
  Administered 2012-03-31 – 2012-04-03 (×7): 100 mg via ORAL
  Filled 2012-03-31 (×8): qty 1

## 2012-03-31 MED ORDER — POLYETHYLENE GLYCOL 3350 17 G PO PACK
17.0000 g | PACK | Freq: Every day | ORAL | Status: DC
  Administered 2012-03-31 – 2012-04-01 (×2): 17 g via ORAL
  Filled 2012-03-31 (×2): qty 1

## 2012-03-31 NOTE — Progress Notes (Signed)
ANTICOAGULATION CONSULT NOTE  Pharmacy Consult for Heparin  Indication: atrial fibrillation  Allergies  Allergen Reactions  . Nifedipine     REACTION: made her weak    Patient Measurements: Height: 4\' 11"  (149.9 cm) Weight: 138 lb 11.2 oz (62.914 kg) IBW/kg (Calculated) : 43.2 Heparin Dosing Weight: 63 kg  Vital Signs: Temp: 98.8 F (37.1 C) (03/12 2100) Temp src: Oral (03/12 2100) BP: 148/95 mmHg (03/12 2100) Pulse Rate: 85 (03/12 2100)  Labs:  Recent Labs  03/29/12 1316 03/29/12 1337 03/29/12 1936 03/30/12 0038 03/30/12 0730 03/30/12 0731 03/30/12 1649 03/30/12 2311  HGB 11.3*  --   --   --  8.4*  --   --   --   HCT 32.8*  --   --   --  25.0*  --   --   --   PLT 286  --   --   --  274  --   --   --   APTT  --  37  --   --   --   --   --   --   LABPROT  --  19.1*  --   --  20.6*  --   --   --   INR  --  1.66*  --   --  1.84*  --   --   --   HEPARINUNFRC  --   --   --   --  0.21*  --  0.43 0.56  CREATININE 1.46*  --   --   --  1.19*  --   --   --   TROPONINI  --   --  6.85* 5.55*  --  5.02*  --   --     Estimated Creatinine Clearance: 28.4 ml/min (by C-G formula based on Cr of 1.19).  Assessment:   79 yo female with Afib for heparin    Goal of Therapy:  INR 2-3 Heparin level 0.3-0.7 units/ml Monitor platelets by anticoagulation protocol: Yes   Plan:  Continue Heparin at current rate   Abbott, Gary Fleet

## 2012-03-31 NOTE — Progress Notes (Signed)
[facility] PROGRESS NOTE  Veronica Powers ZOX:[id number] DOB: 19-Nov-1925 DOA: 03/29/2012  PCP: Laurena Slimmer, MD  Brief HPI: Veronica Powers is a 79 y.o. female with a past medical history of chronic diastolic heart failure,paroxysmal A. Fib currently on Coumadin recently discharge from the hospital on January 2014 right parotitis. During that admission her Lasix was increased. She went to see her primary care doctor and at that time her potassium was 6.0 her creatinine was 2.5. She was feeling weak. She also had paroxysmal A. Fib in the 160s. In the emergency room she was given diltiazem and she converted back into sinus rhythm. Patient the patient denied any shortness of breath chest pain or swelling. She relates her urine is very concentrated and she is thirsty all the time.   Past medical history:  Past Medical History  Diagnosis Date  . Atrial fibrillation     permanent  . Atrial fibrillation with controlled ventricular response   . Aortic stenosis, moderate   . Diabetes     type 2  . Stroke     left sided residual  . CHF (congestive heart failure)   . Hypertension   . Gout   . Anginal pain   . Shortness of breath   . GERD (gastroesophageal reflux disease)     Consultants: LB Cards  Procedures: None  Antibiotics: None  Subjective: Patient continues to feel better. Denies chest pain. Says she is constipated.   Objective: Vital Signs  Filed Vitals:   03/30/12 1300 03/30/12 2100 03/31/12 0630 03/31/12 0947  BP: 147/87 148/95 144/94 148/90  Pulse: 75 85 71 72  Temp: 97.4 F (36.3 C) 98.8 F (37.1 C) 97.4 F (36.3 C)   TempSrc: Oral Oral Oral   Resp: 18 18 20    Height:      Weight:   53.706 kg (118 lb 6.4 oz)   SpO2: 98% 99% 100%     Intake/Output Summary (Last 24 hours) at 03/31/12 1226 Last data filed at 03/31/12 0900  Gross per 24 hour  Intake  688.2 ml  Output   1200 ml  Net -511.8 ml   Filed Weights   03/29/12 2100 03/30/12 0500 03/31/12 0630   Weight: 63.05 kg (139 lb) 62.914 kg (138 lb 11.2 oz) 53.706 kg (118 lb 6.4 oz)   Tele: few episodes of tachycardia this morning. General appearance: alert, cooperative, appears stated age and no distress Throat: lips, mucosa, and tongue normal; teeth and gums normal Resp: clear to auscultation bilaterally Cardio: regular rate and rhythm, S1, S2 normal, no murmur, click, rub or gallop GI: soft, non-tender; bowel sounds normal; no masses,  no organomegaly Extremities: extremities normal, atraumatic, no cyanosis or edema Pulses: 2+ and symmetric  Lab Results:  Basic Metabolic Panel:  Recent Labs Lab 03/29/12 1316 03/30/12 0730 03/31/12 0502  NA 133* 137 136  K 3.2* 3.1* 3.9  CL 93* 101 105  CO2 24 21 18*  GLUCOSE 143* 97 117*  BUN 22 22 18   CREATININE 1.46* 1.19* 0.90  CALCIUM 9.3 8.3* 8.3*   Liver Function Tests:  Recent Labs Lab 03/30/12 0730  AST 29  ALT 22  ALKPHOS 64  BILITOT 0.8  PROT 6.2  ALBUMIN 2.5*   CBC:  Recent Labs Lab 03/29/12 1316 03/30/12 0730 03/31/12 0502  WBC 10.7* 7.0 5.3  NEUTROABS 8.2*  --   --   HGB 11.3* 8.4* 9.5*  HCT 32.8* 25.0* 27.1*  MCV 86.1 87.1 87.7  PLT 286 274 294  Cardiac Enzymes:  Recent Labs Lab 03/29/12 1936 03/30/12 0038 03/30/12 0731 03/31/12 0502  TROPONINI 6.85* 5.55* 5.02* 3.01*   BNP (last 3 results)  Recent Labs  02/19/12 1050  PROBNP 1951.0*   CBG:  Recent Labs Lab 03/30/12 1243 03/30/12 1702 03/30/12 2135 03/31/12 0733 03/31/12 1202  GLUCAP 105* 116* 170* 121* 146*    Recent Results (from the past 240 hour(s))  MRSA PCR SCREENING     Status: Abnormal   Collection Time    03/29/12  8:29 PM      Result Value Range Status   MRSA by PCR POSITIVE (*) NEGATIVE Final   Comment:            The GeneXpert MRSA Assay (FDA     approved for NASAL specimens     only), is one component of a     comprehensive MRSA colonization     surveillance program. It is not     intended to diagnose MRSA      infection nor to guide or     monitor treatment for     MRSA infections.     RESULT CALLED TO, READ BACK BY AND VERIFIED WITH:     Johnnette Litter RN [id number] 03/29/12 A BROWNING      Studies/Results: Dg Chest 2 View  03/29/2012  *RADIOLOGY REPORT*  Clinical Data: Near syncope  CHEST - 2 VIEW  Comparison: 02/19/2012  Findings: Mild cardiac enlargement is stable.  Decreased lung volumes.  No pleural effusion or edema.  No airspace consolidation identified.  IMPRESSION:  1.  No acute cardiopulmonary abnormalities   Original Report Authenticated By: Signa Kell, M.D.     Medications:  Scheduled: . aspirin EC  81 mg Oral Daily  . atorvastatin  20 mg Oral Daily  . brimonidine  1 drop Both Eyes BID  . brinzolamide  1 drop Both Eyes BID  . Chlorhexidine Gluconate Cloth  6 each Topical Q0600  . guanFACINE  2 mg Oral QHS  . metoprolol tartrate  25 mg Oral BID  . multivitamin with minerals  1 tablet Oral Daily  . mupirocin ointment  1 application Nasal BID  . pilocarpine  1 drop Both Eyes QID  . sodium chloride  3 mL Intravenous Q12H  . warfarin  2.5 mg Oral ONCE-1800  . Warfarin - Pharmacist Dosing Inpatient   Does not apply q1800   Continuous:   RUE:[id number], acetaminophen, colchicine, guaiFENesin, ondansetron (ZOFRAN) IV, ondansetron, oxybutynin, polyethylene glycol, polyvinyl alcohol  Assessment/Plan:  Principal Problem:   Acute kidney injury Active Problems:   HYPERTENSION, HEART CONTROLLED W/O ASSOC CHF   AORTIC STENOSIS   Chronic anticoagulation   Hypokalemia   Weakness   Diabetes   Atrial fibrillation with rapid ventricular response    Acute kidney injury with weakness Renal function is back to baseline. ARf likely due to diuretics that were added to her regimen during previous hospitalization. Stopped Indocin she's also taking home which may be contributing to her acute kidney injury.   Atrial fibrillation with rapid ventricular response and elevated troponins:   Occasional episodes of breakthrough tachycardia. Will increase dose of metoprolol. Most likely paroxysmal A. Fib. Troponin did elevate significantly. Patient denies chest pain. Discussed with cardiology. She likely had NSTEMI due to presenting issues. Aspirin added. No further intervention. Stop heparin as INR is therapeutic. Did have an ECHO in feb.  Anemia Acute drop in Hgb noted 3/12 but stable today. Likely dilutional. No overt bleeding. Check FOBT. Monitor Hgb  closely.  HYPERTENSION Monitor closely.   Chronic anticoagulation  Coumadin per pharmacy. INR is therapeutic.  Hypokalemia:  Repleted  Code Status: full code  Family Communication: daughter at bedside Disposition Plan: PT/OT recommends SNF but daughter states she can provide 24 hr care at home. Will order home health. Anticipate discharge 3/14. Would like HR to be better controlled. Will discontinue foley.  DVT Prophylaxis On warfarin and heparin   LOS: 2 days   [facility]  [facility] Pager [phone] 03/31/2012, 12:26 PM  If 8PM-8AM, please contact night-coverage at [email or web address], password [email or web address]

## 2012-03-31 NOTE — Progress Notes (Signed)
Subjective:  The patient denies any chest pain or dyspnea. Rhythm atrial fib with controlled ventricular response. Troponins are significantly elevated consistent with NSTEMI probably secondary to demand ischemia from marked hypotension and rapid heart rate on admission.  Objective:  Vital Signs in the last 24 hours: Temp:  [97.4 F (36.3 C)-98.8 F (37.1 C)] 97.4 F (36.3 C) (03/13 0630) Pulse Rate:  [71-85] 71 (03/13 0630) Resp:  [18-20] 20 (03/13 0630) BP: (144-148)/(87-95) 144/94 mmHg (03/13 0630) SpO2:  [98 %-100 %] 100 % (03/13 0630) Weight:  [118 lb 6.4 oz (53.706 kg)] 118 lb 6.4 oz (53.706 kg) (03/13 0630)  Intake/Output from previous day: 03/12 0701 - 03/13 0700 In: 800.4 [P.O.:240; I.V.:560.4] Out: 1200 [Urine:1200] Intake/Output from this shift:    . atorvastatin  20 mg Oral Daily  . brimonidine  1 drop Both Eyes BID  . brinzolamide  1 drop Both Eyes BID  . Chlorhexidine Gluconate Cloth  6 each Topical Q0600  . guanFACINE  2 mg Oral QHS  . metoprolol tartrate  12.5 mg Oral BID  . multivitamin with minerals  1 tablet Oral Daily  . mupirocin ointment  1 application Nasal BID  . pilocarpine  1 drop Both Eyes QID  . sodium chloride  3 mL Intravenous Q12H  . Warfarin - Pharmacist Dosing Inpatient   Does not apply q1800   . heparin 1,050 Units/hr (03/30/12 0900)    Physical Exam: The patient appears to be in no distress.  Head and neck exam reveals that the pupils are equal and reactive.  The extraocular movements are full.  There is no scleral icterus.  Mouth and pharynx are benign.  No lymphadenopathy.  No carotid bruits.  The jugular venous pressure is normal.  Thyroid is not enlarged or tender.  Chest is clear to percussion and auscultation.  No rales or rhonchi.  Expansion of the chest is symmetrical.  Heart reveals grade 2/6 systolic ejection murmur loudest at LSE.  No gallop or rub.  The abdomen is soft and nontender.  Bowel sounds are normoactive.   There is no hepatosplenomegaly or mass.  There are no abdominal bruits.  Extremities reveal no phlebitis or edema.  Pedal pulses are good.  There is no cyanosis or clubbing.  Neurologic exam left-sided weakness.    Integument reveals no rash  Lab Results:  Recent Labs  03/30/12 0730 03/31/12 0502  WBC 7.0 5.3  HGB 8.4* 9.5*  PLT 274 294    Recent Labs  03/30/12 0730 03/31/12 0502  NA 137 136  K 3.1* 3.9  CL 101 105  CO2 21 18*  GLUCOSE 97 117*  BUN 22 18  CREATININE 1.19* 0.90    Recent Labs  03/30/12 0731 03/31/12 0502  TROPONINI 5.02* 3.01*   Hepatic Function Panel  Recent Labs  03/30/12 0730  PROT 6.2  ALBUMIN 2.5*  AST 29  ALT 22  ALKPHOS 64  BILITOT 0.8   No results found for this basename: CHOL,  in the last 72 hours No results found for this basename: PROTIME,  in the last 72 hours  Imaging: Dg Chest 2 View  03/29/2012  *RADIOLOGY REPORT*  Clinical Data: Near syncope  CHEST - 2 VIEW  Comparison: 02/19/2012  Findings: Mild cardiac enlargement is stable.  Decreased lung volumes.  No pleural effusion or edema.  No airspace consolidation identified.  IMPRESSION:  1.  No acute cardiopulmonary abnormalities   Original Report Authenticated By: Signa Kell, M.D.  Cardiac Studies: Telemetry shows atrial fib with controlled VR. Assessment/Plan:  1. Permanent atrial fib on warfarin and BB 2. Old CVA 3. Moderate aortic stenosis 4. NSTEMI secondary to demand ischemia.  Plan: Add ASA 81 mg daily. Continue BB, statin.          EKG today  LOS: 2 days    Cassell Clement 03/31/2012, 7:50 AM

## 2012-03-31 NOTE — Evaluation (Signed)
Occupational Therapy Evaluation Patient Details Name: Veronica Powers MRN: [id number] DOB: 01/24/1925 Today's Date: 03/31/2012 Time: 4098-1191 OT Time Calculation (min): 42 min  OT Assessment / Plan / Recommendation Clinical Impression  Pt is 79y/o female dx A-fib, acute kidney injury w/ sig PMH. She is currently deconditioned and demonstrates deficits for SPT & ADL's/self care. She should benefit from acute OT followed by SNF rehab.    OT Assessment  Patient needs continued OT Services    Follow Up Recommendations  SNF    Barriers to Discharge Inaccessible home environment Pt has 2 story home and bed/bathroom is on second floor. Daughter and family assist but pt currently deconditioned.  Equipment Recommendations  None recommended by OT    Recommendations for Other Services    Frequency  Min 3X/week    Precautions / Restrictions Precautions Precautions: Fall;Other (comment) Restrictions Weight Bearing Restrictions: No   Pertinent Vitals/Pain Pt denies pain, reports fatigue, deconditioning    ADL  Grooming: Simulated;Moderate assistance Where Assessed - Grooming: Supported sitting Upper Body Bathing: Simulated;Moderate assistance Where Assessed - Upper Body Bathing: Supported sitting Lower Body Bathing: Simulated;Maximal assistance Where Assessed - Lower Body Bathing: Supported sitting Upper Body Dressing: Performed;Minimal assistance Where Assessed - Upper Body Dressing: Supported sitting Lower Body Dressing: Performed;Maximal assistance Where Assessed - Lower Body Dressing: Rolling right and/or left;Supine, head of bed flat Toilet Transfer: Performed;+2 Total assistance Toilet Transfer: Patient Percentage: 40% Toilet Transfer Method: Stand pivot Acupuncturist: Materials engineer and Hygiene: Performed;Maximal assistance Toileting - Architect and Hygiene: Patient Percentage: 40% Where Assessed - Medical sales representative and Hygiene: Sit on 3-in-1 or toilet Tub/Shower Transfer Method: Not assessed Equipment Used: Gait belt Transfers/Ambulation Related to ADLs: Pt currently +2 total assist for safety during stand pivot transfers from bed-3:1 and 3:1-chair. Pt w/ old CVA w/ resultant L sided hemiparesis (has some AROM shoulder & knee), now deconditioned and requiring increased support. ADL Comments: Pt is overall +2 total assist for SPT to/from 3:1, chair & bed. Pt & pt's daughter report increased independence (occasional supervision SPT to 3:1) prior to last several weeks & pt should benefit from acute OT to address current deficits in ADL's and self care.     OT Diagnosis: Generalized weakness;Other (comment) (Old CVA w/ Hemiparesis L side)  OT Problem List: Decreased strength;Decreased activity tolerance;Impaired balance (sitting and/or standing);Decreased knowledge of use of DME or AE;Decreased safety awareness;Cardiopulmonary status limiting activity;Impaired UE functional use;Other (comment) (Old CVA w/ L sided hemiparesis) OT Treatment Interventions: Self-care/ADL training;Energy conservation;DME and/or AE instruction;Therapeutic activities;Patient/family education;Balance training   OT Goals Acute Rehab OT Goals OT Goal Formulation: With patient/family Potential to Achieve Goals: Good ADL Goals Pt Will Perform Grooming: with set-up;Sitting, chair;Sitting at sink ADL Goal: Grooming - Progress: Goal set today Pt Will Perform Upper Body Dressing: with min assist;with set-up;Sitting, bed;Sitting, chair ADL Goal: Upper Body Dressing - Progress: Goal set today Pt Will Perform Lower Body Dressing: with min assist;with set-up;Sit to stand from chair;with adaptive equipment;Sit to stand from bed ADL Goal: Lower Body Dressing - Progress: Goal set today Pt Will Transfer to Toilet: with supervision;with min assist;with DME;Drop arm 3-in-1 ADL Goal: Toilet Transfer - Progress: Goal set today Pt Will  Perform Toileting - Clothing Manipulation: with supervision;with min assist;Sitting on 3-in-1 or toilet ADL Goal: Toileting - Clothing Manipulation - Progress: Goal set today Pt Will Perform Toileting - Hygiene: with modified independence;with set-up;Sitting on 3-in-1 or toilet ADL Goal: Toileting - Hygiene - Progress:  Goal set today Additional ADL Goal #1: Pt will be Min A  A/E use for LB dressing/bathe PRN to assist with ADL's and self care tasks. ADL Goal: Additional Goal #1 - Progress: Goal set today  Visit Information  Last OT Received On: 03/31/12 Assistance Needed: +2    Subjective Data  Subjective: "I'm just so weak now" Patient Stated Goal: SNF before home w/ family assist   Prior Functioning     Home Living Lives With: Daughter;Other (Comment) Available Help at Discharge: Family Type of Home: House Home Access: Stairs to enter [employer] of Steps: STE Entrance Stairs-Rails: Right Home Layout: Two level;Bed/bath upstairs Alternate Level Stairs-Number of Steps: 14 steps Bathroom Shower/Tub: Tub/shower unit;Curtain Bathroom Toilet: Standard Bathroom Accessibility: Yes How Accessible: Accessible via walker Home Adaptive Equipment: Bedside commode/3-in-1;Straight cane;Wheelchair - manual Prior Function Level of Independence: Needs assistance Needs Assistance: Bathing;Dressing;Meal Prep;Toileting;Grooming;Gait;Transfers Gait Assistance: Pt was ambulating with cane until recent decline in January when pt started needing increased assistance Transfer Assistance: Increased assistance since Jan. Driving: No Communication Communication: No difficulties Dominant Hand: Right    Vision/Perception Vision - History Baseline Vision: Wears glasses only for reading Visual History: Glaucoma Patient Visual Report: No change from baseline   Cognition  Cognition Overall Cognitive Status: Appears within functional limits for tasks assessed/performed Arousal/Alertness:  Awake/alert Orientation Level: Appears intact for tasks assessed Behavior During Session: [facility] for tasks performed    Extremity/Trunk Assessment Right Upper Extremity Assessment RUE ROM/Strength/Tone: Within functional levels RUE Coordination: WFL - gross/fine motor Left Upper Extremity Assessment LUE ROM/Strength/Tone: Deficits LUE ROM/Strength/Tone Deficits: Due to old CVA (has shome shoulder and elbow AROM but no hand/wrist AROM) Right Lower Extremity Assessment RLE ROM/Strength/Tone: Deficits RLE ROM/Strength/Tone Deficits: due to old CVA.  Knee buckeling in standing. Left Lower Extremity Assessment LLE ROM/Strength/Tone: [facility] for tasks assessed     Mobility Bed Mobility Bed Mobility: Right Sidelying to Sit;Sitting - Scoot to Edge of Bed Right Sidelying to Sit: 3: Mod assist;With rails Sitting - Scoot to Edge of Bed: 4: Min assist;With rail Transfers Transfers: Sit to Stand;Stand to Sit Sit to Stand: 1: +2 Total assist;From bed;From chair/3-in-1;With armrests (Pt fatigues easily) Sit to Stand: Patient Percentage: 30% Stand to Sit: 1: +2 Total assist;To chair/3-in-1;With armrests (SPT to R w/ 2nd person for safety) Stand to Sit: Patient Percentage: 40%        Balance Balance Balance Assessed: Yes Static Sitting Balance Static Sitting - Balance Support: Right upper extremity supported;Feet supported Static Sitting - Level of Assistance: 4: Min assist;5: Stand by assistance   End of Session OT - End of Session Equipment Utilized During Treatment: Gait belt;Other (comment) (3:1 drop arm commode) Activity Tolerance: Patient limited by fatigue Patient left: in chair;with call bell/phone within reach;with family/visitor present  GO     Roselie Awkward Dixon 03/31/2012, 11:06 AM

## 2012-03-31 NOTE — Progress Notes (Signed)
Pt IV R AC site noted to be bleeding/leaking.  IV site d/c'd per protocol.  Heparin off for ~61mins at this time.  Site bleeding through 3 2x2 gauze and tape.  Pressure held x10 minutes and extremity elevated. Bleeding slowed.  4x4 gauze applied and wrapped in kurlex gauze.  Pharmacy notified.  WIll continue to monitor at this time.  No other signs of bleeding noted at this time.

## 2012-03-31 NOTE — Progress Notes (Signed)
ANTICOAGULATION CONSULT NOTE  Pharmacy Consult for Heparin  Indication: atrial fibrillation  Allergies  Allergen Reactions  . Nifedipine     REACTION: made her weak    Patient Measurements: Height: 4\' 11"  (149.9 cm) Weight: 118 lb 6.4 oz (53.706 kg) IBW/kg (Calculated) : 43.2 Heparin Dosing Weight: 63 kg  Vital Signs: Temp: 97.4 F (36.3 C) (03/13 0630) Temp src: Oral (03/13 0630) BP: 148/90 mmHg (03/13 0947) Pulse Rate: 72 (03/13 0947)  Labs:  Recent Labs  03/29/12 1316 03/29/12 1337  03/30/12 0038  03/30/12 0730 03/30/12 0731 03/30/12 1649 03/30/12 2311 03/31/12 0502  HGB 11.3*  --   --   --   --  8.4*  --   --   --  9.5*  HCT 32.8*  --   --   --   --  25.0*  --   --   --  27.1*  PLT 286  --   --   --   --  274  --   --   --  294  APTT  --  37  --   --   --   --   --   --   --   --   LABPROT  --  19.1*  --   --   --  20.6*  --   --   --  22.4*  INR  --  1.66*  --   --   --  1.84*  --   --   --  2.06*  HEPARINUNFRC  --   --   --   --   < > 0.21*  --  0.43 0.56 0.56  CREATININE 1.46*  --   --   --   --  1.19*  --   --   --  0.90  TROPONINI  --   --   < > 5.55*  --   --  5.02*  --   --  3.01*  < > = values in this interval not displayed.  Estimated Creatinine Clearance: 34.8 ml/min (by C-G formula based on Cr of 0.9).  Assessment:   79 yo female admitted 03/29/2012   with weakness and afib. Patient is on coumadin PTA for PAF pharmacy consulted to continue while admitted. INR today is 1.66 with last dose given 03/28/12.   Events: DC heparin poss dc today  Anticoagulation: Afib,  INR and heparin level at goal.  No bleeding noted Home dose: 5mg  MWF, 2.5mg  TTSS  Goal of Therapy:  INR 2-3 Heparin level 0.3-0.7 units/ml Monitor platelets by anticoagulation protocol: Yes   Plan:  Follow up heparin DC by MD Warfarin 2.5 mg   Thank you for allowing pharmacy to be a part of this patients care team.  Lovenia Kim Pharm.D., BCPS Clinical Pharmacist 03/31/2012  11:20 AM Pager: [phone] Phone: [phone]

## 2012-03-31 NOTE — Evaluation (Signed)
Physical Therapy Evaluation Patient Details Name: Veronica Powers MRN: [id number] DOB: 08-Apr-1925 Today's Date: 03/31/2012 Time: 7829-5621 PT Time Calculation (min): 42 min  PT Assessment / Plan / Recommendation Clinical Impression  79 y/o BF admitted with acute kidney injury presents with decreased mobility which pt's daughter reports has been since January.  Will follow acutely to address deficits. Recommend SNF for further rehab post acute care due to pt has 14 steps to get to her bedroom/bathroom, and pt is currently +2 Total A transfer.    PT Assessment  Patient needs continued PT services    Follow Up Recommendations  SNF    Does the patient have the potential to tolerate intense rehabilitation      Barriers to Discharge Inaccessible home environment 14 steps to get to bedroom/bathroom    Equipment Recommendations  None recommended by PT    Recommendations for Other Services     Frequency Min 3X/week    Precautions / Restrictions Precautions Precautions: Fall;Other (comment) Restrictions Weight Bearing Restrictions: No   Pertinent Vitals/Pain Denies pain      Mobility  Bed Mobility Bed Mobility: Right Sidelying to Sit;Sitting - Scoot to Edge of Bed Right Sidelying to Sit: 3: Mod assist;With rails Sitting - Scoot to Edge of Bed: 4: Min assist;With rail Transfers Sit to Stand: 1: +2 Total assist;From bed;From chair/3-in-1;With armrests Sit to Stand: Patient Percentage: 30% Stand to Sit: 1: +2 Total assist;To chair/3-in-1;With armrests Stand to Sit: Patient Percentage: 40% Details for Transfer Assistance: Pt unable to fully  get hips over sitting surface without A.  Pt needing L knee blocked to prevent buckeling. Ambulation/Gait Ambulation/Gait Assistance: Not tested (comment)    Exercises     PT Diagnosis: Generalized weakness;Difficulty walking  PT Problem List: Decreased strength;Decreased activity tolerance;Decreased mobility;Decreased knowledge of use of  DME;Decreased balance PT Treatment Interventions: DME instruction;Functional mobility training;Balance training;Therapeutic exercise;Therapeutic activities;Patient/family education   PT Goals Acute Rehab PT Goals PT Goal Formulation: With patient Time For Goal Achievement: 03/31/12 Potential to Achieve Goals: Good Pt will go Supine/Side to Sit: with supervision PT Goal: Supine/Side to Sit - Progress: Goal set today Pt will go Sit to Supine/Side: with min assist;with supervision PT Goal: Sit to Supine/Side - Progress: Goal set today Pt will go Sit to Stand: with min assist PT Goal: Sit to Stand - Progress: Goal set today Pt will go Stand to Sit: with min assist PT Goal: Stand to Sit - Progress: Goal set today Pt will Transfer Bed to Chair/Chair to Bed: with min assist PT Transfer Goal: Bed to Chair/Chair to Bed - Progress: Goal set today Pt will Ambulate: 1 - 15 feet;with mod assist;with least restrictive assistive device PT Goal: Ambulate - Progress: Goal set today  Visit Information  Last PT Received On: 03/31/12 Assistance Needed: +2 PT/OT Co-Evaluation/Treatment: Yes    Subjective Data  Subjective: "iI just stopped being able to walk."  Daughter, Veronica Powers, in at end of eval. Patient Stated Goal: get stronger.  daughter and pt open to SNF.   Prior Functioning  Home Living Lives With: Daughter;Other (Comment) Available Help at Discharge: Family Type of Home: House Home Access: Stairs to enter [employer] of Steps: STE Entrance Stairs-Rails: Right Home Layout: Two level;Bed/bath upstairs Alternate Level Stairs-Number of Steps: 14 steps Bathroom Shower/Tub: Tub/shower unit;Curtain Bathroom Toilet: Standard Bathroom Accessibility: Yes How Accessible: Accessible via walker Home Adaptive Equipment: Bedside commode/3-in-1;Straight cane;Wheelchair - manual Prior Function Level of Independence: Needs assistance Needs Assistance: Bathing;Dressing;Meal  Prep;Toileting;Grooming;Gait;Transfers Gait Assistance: Pt  was ambulating with cane and transferring at S level until recent decline when pt started needing increased assistance Transfer Assistance: Pt had been at S level and now requires increased A. Driving: No Communication Communication: No difficulties Dominant Hand: Right    Cognition  Cognition Overall Cognitive Status: Appears within functional limits for tasks assessed/performed Arousal/Alertness: Awake/alert Orientation Level: Appears intact for tasks assessed Behavior During Session: [facility] for tasks performed    Extremity/Trunk Assessment Right Upper Extremity Assessment RUE ROM/Strength/Tone: Within functional levels RUE Coordination: WFL - gross/fine motor Left Upper Extremity Assessment LUE ROM/Strength/Tone: Deficits LUE ROM/Strength/Tone Deficits: Due to old CVA (has shome shoulder and elbow AROM but no hand/wrist AROM) Right Lower Extremity Assessment RLE ROM/Strength/Tone: Deficits RLE ROM/Strength/Tone Deficits: due to old CVA.  Knee buckeling in standing. Left Lower Extremity Assessment LLE ROM/Strength/Tone: [facility] for tasks assessed   Balance Balance Balance Assessed: Yes Static Sitting Balance Static Sitting - Balance Support: Feet supported;Right upper extremity supported Static Sitting - Level of Assistance: 5: Stand by assistance;4: Min assist  End of Session PT - End of Session Equipment Utilized During Treatment: Gait belt Activity Tolerance: Patient tolerated treatment well Patient left: in chair;with family/visitor present;with call bell/phone within reach  GP     [facility] LUBECK 03/31/2012, 11:15 AM

## 2012-04-01 DIAGNOSIS — I4891 Unspecified atrial fibrillation: Secondary | ICD-10-CM

## 2012-04-01 DIAGNOSIS — N179 Acute kidney failure, unspecified: Secondary | ICD-10-CM

## 2012-04-01 LAB — GLUCOSE, CAPILLARY
Glucose-Capillary: 110 mg/dL — ABNORMAL HIGH (ref 70–99)
Glucose-Capillary: 118 mg/dL — ABNORMAL HIGH (ref 70–99)

## 2012-04-01 LAB — BASIC METABOLIC PANEL
BUN: 14 mg/dL (ref 6–23)
Chloride: 103 mEq/L (ref 96–112)
Glucose, Bld: 116 mg/dL — ABNORMAL HIGH (ref 70–99)
Potassium: 4.3 mEq/L (ref 3.5–5.1)

## 2012-04-01 MED ORDER — POLYETHYLENE GLYCOL 3350 17 G PO PACK
17.0000 g | PACK | Freq: Two times a day (BID) | ORAL | Status: DC
  Administered 2012-04-01 – 2012-04-03 (×4): 17 g via ORAL
  Filled 2012-04-01 (×5): qty 1

## 2012-04-01 MED ORDER — FLEET ENEMA 7-19 GM/118ML RE ENEM
1.0000 | ENEMA | Freq: Once | RECTAL | Status: AC
  Administered 2012-04-01: 20:00:00 via RECTAL
  Filled 2012-04-01: qty 1

## 2012-04-01 MED ORDER — PINDOLOL 5 MG PO TABS
5.0000 mg | ORAL_TABLET | Freq: Two times a day (BID) | ORAL | Status: DC
  Administered 2012-04-01 – 2012-04-03 (×5): 5 mg via ORAL
  Filled 2012-04-01 (×6): qty 1

## 2012-04-01 MED ORDER — WARFARIN SODIUM 2.5 MG PO TABS
2.5000 mg | ORAL_TABLET | Freq: Once | ORAL | Status: AC
  Administered 2012-04-01: 2.5 mg via ORAL
  Filled 2012-04-01 (×2): qty 1

## 2012-04-01 NOTE — Progress Notes (Signed)
[facility] PROGRESS NOTE  Veronica Powers:[id number] DOB: 1925-03-26 DOA: 03/29/2012  PCP: Laurena Slimmer, MD  Brief HPI: Veronica Powers is a 79 y.o. female with a past medical history of chronic diastolic heart failure,paroxysmal A. Fib currently on Coumadin recently discharge from the hospital on January 2014 right parotitis. During that admission her Lasix was increased. She went to see her primary care doctor and at that time her potassium was 6.0 her creatinine was 2.5. She was feeling weak. She also had paroxysmal A. Fib in the 160s. In the emergency room she was given diltiazem and she converted back into sinus rhythm. Patient the patient denied any shortness of breath chest pain or swelling. She relates her urine is very concentrated and she is thirsty all the time.   Past medical history:  Past Medical History  Diagnosis Date  . Atrial fibrillation     permanent  . Atrial fibrillation with controlled ventricular response   . Aortic stenosis, moderate   . Diabetes     type 2  . Stroke     left sided residual  . CHF (congestive heart failure)   . Hypertension   . Gout   . Anginal pain   . Shortness of breath   . GERD (gastroesophageal reflux disease)     Consultants: [facility] Cards  Procedures: None  Antibiotics: None  Subjective: Patient denies chest pain. Still no BM.   Objective: Vital Signs  Filed Vitals:   03/31/12 1500 03/31/12 2100 04/01/12 0500 04/01/12 1015  BP: 144/87 141/86 133/75 160/100  Pulse: 86 77 78 109  Temp: 97.8 F (36.6 C) 97.9 F (36.6 C) 98.1 F (36.7 C)   TempSrc: Oral Oral Oral   Resp: 20 20 20    Height:      Weight:   57.38 kg (126 lb 8 oz)   SpO2: 96% 100% 99%     Intake/Output Summary (Last 24 hours) at 04/01/12 1420 Last data filed at 04/01/12 1000  Gross per 24 hour  Intake      0 ml  Output    350 ml  Net   -350 ml   Filed Weights   03/30/12 0500 03/31/12 0630 04/01/12 0500  Weight: 62.914 kg (138 lb 11.2 oz)  53.706 kg (118 lb 6.4 oz) 57.38 kg (126 lb 8 oz)   Tele: few episodes of tachycardia this morning. General appearance: alert, cooperative, appears stated age and no distress Throat: lips, mucosa, and tongue normal; teeth and gums normal Resp: clear to auscultation bilaterally Cardio: regular rate and rhythm, S1, S2 normal, no murmur, click, rub or gallop GI: soft, non-tender; bowel sounds normal; no masses,  no organomegaly Extremities: extremities normal, atraumatic, no cyanosis or edema Pulses: 2+ and symmetric  Lab Results:  Basic Metabolic Panel:  Recent Labs Lab 03/29/12 1316 03/30/12 0730 03/31/12 0502 04/01/12 0640  NA 133* 137 136 136  K 3.2* 3.1* 3.9 4.3  CL 93* 101 105 103  CO2 24 21 18* 21  GLUCOSE 143* 97 117* 116*  BUN 22 22 18 14   CREATININE 1.46* 1.19* 0.90 0.98  CALCIUM 9.3 8.3* 8.3* 8.8   Liver Function Tests:  Recent Labs Lab 03/30/12 0730  AST 29  ALT 22  ALKPHOS 64  BILITOT 0.8  PROT 6.2  ALBUMIN 2.5*   CBC:  Recent Labs Lab 03/29/12 1316 03/30/12 0730 03/31/12 0502  WBC 10.7* 7.0 5.3  NEUTROABS 8.2*  --   --   HGB 11.3* 8.4* 9.5*  HCT  32.8* 25.0* 27.1*  MCV 86.1 87.1 87.7  PLT 286 274 294   Cardiac Enzymes:  Recent Labs Lab 03/29/12 1936 03/30/12 0038 03/30/12 0731 03/31/12 0502  TROPONINI 6.85* 5.55* 5.02* 3.01*   BNP (last 3 results)  Recent Labs  02/19/12 1050  PROBNP 1951.0*   CBG:  Recent Labs Lab 03/31/12 1202 03/31/12 1645 03/31/12 2145 04/01/12 0745 04/01/12 1155  GLUCAP 146* 114* 92 110* 110*    Recent Results (from the past 240 hour(s))  MRSA PCR SCREENING     Status: Abnormal   Collection Time    03/29/12  8:29 PM      Result Value Range Status   MRSA by PCR POSITIVE (*) NEGATIVE Final   Comment:            The GeneXpert MRSA Assay (FDA     approved for NASAL specimens     only), is one component of a     comprehensive MRSA colonization     surveillance program. It is not     intended to  diagnose MRSA     infection nor to guide or     monitor treatment for     MRSA infections.     RESULT CALLED TO, READ BACK BY AND VERIFIED WITH:     Johnnette Litter RN [id number] 03/29/12 A BROWNING      Studies/Results: No results found.  Medications:  Scheduled: . aspirin EC  81 mg Oral Daily  . atorvastatin  20 mg Oral Daily  . brimonidine  1 drop Both Eyes BID  . brinzolamide  1 drop Both Eyes BID  . Chlorhexidine Gluconate Cloth  6 each Topical Q0600  . docusate sodium  100 mg Oral BID  . guanFACINE  2 mg Oral QHS  . multivitamin with minerals  1 tablet Oral Daily  . mupirocin ointment  1 application Nasal BID  . pilocarpine  1 drop Both Eyes QID  . pindolol  5 mg Oral BID  . polyethylene glycol  17 g Oral Daily  . sodium chloride  3 mL Intravenous Q12H  . warfarin  2.5 mg Oral ONCE-1800  . Warfarin - Pharmacist Dosing Inpatient   Does not apply q1800   Continuous:   JWJ:XBJYNWGNFAOZH, acetaminophen, colchicine, guaiFENesin, ondansetron (ZOFRAN) IV, ondansetron, oxybutynin, polyvinyl alcohol  Assessment/Plan:  Principal Problem:   Acute kidney injury Active Problems:   HYPERTENSION, HEART CONTROLLED W/O ASSOC CHF   AORTIC STENOSIS   Chronic anticoagulation   Hypokalemia   Weakness   Diabetes   Atrial fibrillation with rapid ventricular response    Acute kidney injury with weakness Renal function is back to baseline. ARf likely due to diuretics that were added to her regimen during previous hospitalization. Stopped Indocin she's also taking home which may be contributing to her acute kidney injury.   Atrial fibrillation with rapid ventricular response and elevated troponins:  Tachycardic this morning. Seen by cards and medications changed to pindolol from metoprolol. Troponin did elevate significantly. Patient denies chest pain. She likely had NSTEMI due to presenting issues. Aspirin added. No further intervention. INR is therapeutic. Did have an ECHO in  feb.  Anemia Acute drop in Hgb noted 3/12 but stable today. Likely dilutional. No overt bleeding. Check FOBT. Monitor Hgb closely.  HYPERTENSION Monitor closely.   Chronic anticoagulation  Coumadin per pharmacy. INR is therapeutic.  Hypokalemia:  Repleted  Code Status: full code  Family Communication: daughter at bedside Disposition Plan: PT/OT recommends SNF but daughter states she  can provide 24 hr care at home. Will order home health. Would like HR to be better controlled. Hopefully discharge on 3/15 if HR is better.  DVT Prophylaxis On warfarin and heparin   LOS: 3 days   [facility]  [facility] Pager [phone] 04/01/2012, 2:20 PM  If 8PM-8AM, please contact night-coverage at [email or web address], password [facility]

## 2012-04-01 NOTE — Progress Notes (Signed)
ANTICOAGULATION CONSULT NOTE  Pharmacy Consult for Coumadin Indication: atrial fibrillation  Allergies  Allergen Reactions  . Nifedipine     REACTION: made her weak   Labs:  Recent Labs  03/30/12 0038  03/30/12 0730 03/30/12 0731 03/30/12 1649 03/30/12 2311 03/31/12 0502 04/01/12 0640  HGB  --   --  8.4*  --   --   --  9.5*  --   HCT  --   --  25.0*  --   --   --  27.1*  --   PLT  --   --  274  --   --   --  294  --   LABPROT  --   --  20.6*  --   --   --  22.4* 25.1*  INR  --   --  1.84*  --   --   --  2.06* 2.41*  HEPARINUNFRC  --   < > 0.21*  --  0.43 0.56 0.56  --   CREATININE  --   --  1.19*  --   --   --  0.90 0.98  TROPONINI 5.55*  --   --  5.02*  --   --  3.01*  --   < > = values in this interval not displayed.  Estimated Creatinine Clearance: 33 ml/min (by C-G formula based on Cr of 0.98).  Assessment:   79 yo female admitted 03/29/2012   with weakness and afib. Patient is on coumadin PTA for PAF pharmacy consulted to continue while admitted. INR today is 1.66 with last dose given 03/28/12.   Anticoagulation: Afib,  INR  at goal.  No bleeding noted Home dose: 5mg  MWF, 2.5mg  TTSS  Goal of Therapy:  INR 2-3 Heparin level 0.3-0.7 units/ml Monitor platelets by anticoagulation protocol: Yes   Plan:  Repeat Warfarin 2.5 mg Daily INR  Thank you. Okey Regal, PharmD [phone]  04/01/2012 1:59 PM

## 2012-04-01 NOTE — Progress Notes (Signed)
Referral received today for possible SNF placement. Full assessment to follow.   CSW handed-off this information to weekend CSW to start the process of FL[facility], [place] & send out for snf placement.   Sherald Barge, LCSW-A Clinical Social Worker [phone]

## 2012-04-01 NOTE — Progress Notes (Signed)
CARDIOLOGY ROUNDING NOTE    Patient Name: Veronica Powers Date of Encounter: 04-01-2012    SUBJECTIVE:Patient states she is feeling a little better.  No chest pain or shortness of breath.  She is concerned about constipation this morning.   TELEMETRY: Reviewed telemetry pt in atrial fibrillation, rates 80's-120's  SCHEDULED MEDICATIONS: . aspirin EC  81 mg Oral Daily  . atorvastatin  20 mg Oral Daily  . brimonidine  1 drop Both Eyes BID  . brinzolamide  1 drop Both Eyes BID  . Chlorhexidine Gluconate Cloth  6 each Topical Q0600  . docusate sodium  100 mg Oral BID  . guanFACINE  2 mg Oral QHS  . metoprolol tartrate  25 mg Oral BID  . multivitamin with minerals  1 tablet Oral Daily  . mupirocin ointment  1 application Nasal BID  . pilocarpine  1 drop Both Eyes QID  . polyethylene glycol  17 g Oral Daily  . sodium chloride  3 mL Intravenous Q12H  . Warfarin - Pharmacist Dosing Inpatient   Does not apply q1800    Filed Vitals:   03/31/12 0947 03/31/12 1500 03/31/12 2100 04/01/12 0500  BP: 148/90 144/87 141/86 133/75  Pulse: 72 86 77 78  Temp:  97.8 F (36.6 C) 97.9 F (36.6 C) 98.1 F (36.7 C)  TempSrc:  Oral Oral Oral  Resp:  20 20 20   Height:      Weight:    126 lb 8 oz (57.38 kg)  SpO2:  96% 100% 99%    Intake/Output Summary (Last 24 hours) at 04/01/12 0742 Last data filed at 04/01/12 0031  Gross per 24 hour  Intake  928.2 ml  Output    150 ml  Net  778.2 ml    LABS: Basic Metabolic Panel:  Recent Labs  16/10/96 0502 04/01/12 0640  NA 136 136  K 3.9 4.3  CL 105 103  CO2 18* 21  GLUCOSE 117* 116*  BUN 18 14  CREATININE 0.90 0.98  CALCIUM 8.3* 8.8   Liver Function Tests:  Recent Labs  03/30/12 0730  AST 29  ALT 22  ALKPHOS 64  BILITOT 0.8  PROT 6.2  ALBUMIN 2.5*   CBC:  Recent Labs  03/29/12 1316 03/30/12 0730 03/31/12 0502  WBC 10.7* 7.0 5.3  NEUTROABS 8.2*  --   --   HGB 11.3* 8.4* 9.5*  HCT 32.8* 25.0* 27.1*  MCV 86.1 87.1  87.7  PLT 286 274 294   Cardiac Enzymes:  Recent Labs  03/30/12 0038 03/30/12 0731 03/31/12 0502  TROPONINI 5.55* 5.02* 3.01*   INR: 2.41  Radiology/Studies:  Dg Chest 2 View 03/29/2012  *RADIOLOGY REPORT*  Clinical Data: Near syncope  CHEST - 2 VIEW  Comparison: 02/19/2012  Findings: Mild cardiac enlargement is stable.  Decreased lung volumes.  No pleural effusion or edema.  No airspace consolidation identified.  IMPRESSION:  1.  No acute cardiopulmonary abnormalities   Original Report Authenticated By: Signa Kell, M.D.    PHYSICAL EXAM  Well developed and nourished in no acute distress HENT normal Neck supple with JVP-flat Clear Fast irregularly irregular , no murmurs or gallops Abd-soft with active BS No Clubbing cyanosis edema Skin-warm and dry A & Oriented  Grossly normal sensory and motor function   Principal Problem:   Acute kidney injury Active Problems:   HYPERTENSION, HEART CONTROLLED W/O ASSOC CHF   AORTIC STENOSIS   Chronic anticoagulation   Hypokalemia   Weakness   Diabetes  Atrial fibrillation with rapid ventricular response  With AF RVR, will change to ISA beta blocker and then anticipate holter in 2-3 weeks  May need pacing

## 2012-04-02 DIAGNOSIS — I119 Hypertensive heart disease without heart failure: Secondary | ICD-10-CM

## 2012-04-02 LAB — CBC
HCT: 25.6 % — ABNORMAL LOW (ref 36.0–46.0)
Platelets: 321 10*3/uL (ref 150–400)
RBC: 2.94 MIL/uL — ABNORMAL LOW (ref 3.87–5.11)
RDW: 17.2 % — ABNORMAL HIGH (ref 11.5–15.5)
WBC: 5.1 10*3/uL (ref 4.0–10.5)

## 2012-04-02 LAB — BASIC METABOLIC PANEL
Chloride: 104 mEq/L (ref 96–112)
GFR calc Af Amer: 60 mL/min — ABNORMAL LOW (ref >90)
Potassium: 4 mEq/L (ref 3.5–5.1)

## 2012-04-02 LAB — PROTIME-INR: INR: 2.58 — ABNORMAL HIGH (ref 0.00–1.49)

## 2012-04-02 LAB — GLUCOSE, CAPILLARY
Glucose-Capillary: 103 mg/dL — ABNORMAL HIGH (ref 70–99)
Glucose-Capillary: 117 mg/dL — ABNORMAL HIGH (ref 70–99)

## 2012-04-02 MED ORDER — LISINOPRIL 10 MG PO TABS
10.0000 mg | ORAL_TABLET | Freq: Every day | ORAL | Status: DC
  Filled 2012-04-02: qty 1

## 2012-04-02 MED ORDER — WARFARIN SODIUM 2.5 MG PO TABS
2.5000 mg | ORAL_TABLET | Freq: Once | ORAL | Status: AC
  Administered 2012-04-02: 2.5 mg via ORAL
  Filled 2012-04-02: qty 1

## 2012-04-02 MED ORDER — HYDRALAZINE HCL 10 MG PO TABS
20.0000 mg | ORAL_TABLET | Freq: Two times a day (BID) | ORAL | Status: DC
  Administered 2012-04-02 – 2012-04-03 (×3): 20 mg via ORAL
  Filled 2012-04-02 (×4): qty 2

## 2012-04-02 NOTE — Progress Notes (Signed)
ANTICOAGULATION CONSULT NOTE  Pharmacy Consult for Coumadin Indication: atrial fibrillation  Allergies  Allergen Reactions  . Nifedipine     REACTION: made her weak   Labs:  Recent Labs  03/30/12 1649 03/30/12 2311 03/31/12 0502 04/01/12 0640 04/02/12 0520  HGB  --   --  9.5*  --  8.6*  HCT  --   --  27.1*  --  25.6*  PLT  --   --  294  --  321  LABPROT  --   --  22.4* 25.1* 26.4*  INR  --   --  2.06* 2.41* 2.58*  HEPARINUNFRC 0.43 0.56 0.56  --   --   CREATININE  --   --  0.90 0.98 0.97  TROPONINI  --   --  3.01*  --   --     Estimated Creatinine Clearance: 33.3 ml/min (by C-G formula based on Cr of 0.97).  Assessment:   79 yo female admitted 03/29/2012   with weakness and afib. Patient is on coumadin PTA for PAF pharmacy consulted to continue while admitted. INR today is 2.58 at goal 2-3. Slight drop in h/h from admit but no bleeding noted and h/h stable.  Home dose: 5mg  MWF, 2.5mg  TTSS  Goal of Therapy:  INR 2-3 Heparin level 0.3-0.7 units/ml Monitor platelets by anticoagulation protocol: Yes   Plan:  Repeat Warfarin 2.5 mg Daily INR  [name] Pharm.D. CPP, BCPS Clinical Pharmacist [phone] 04/02/2012 12:11 PM

## 2012-04-02 NOTE — Clinical Social Work Psychosocial (Signed)
Clinical Social Work Department BRIEF PSYCHOSOCIAL ASSESSMENT 04/02/2012  Patient:  Veronica Powers, Veronica Powers     Account Number:  [id number]     Admit date:  03/29/2012  Clinical Social Worker:  Veronica Powers  Date/Time:  04/02/2012 03:58 PM  Referred by:  CSW  Date Referred:  04/01/2012 Referred for  SNF Placement   Other Referral:   Interview type:  Family Other interview type:    PSYCHOSOCIAL DATA Living Status:  FAMILY Admitted from facility:   Level of care:   Primary support name:  Veronica Powers Primary support relationship to patient:  CHILD, ADULT Degree of support available:   Adequate    CURRENT CONCERNS Current Concerns  Post-Acute Placement   Other Concerns:    SOCIAL WORK ASSESSMENT / PLAN CSW contacted patient's daughters Veronica Powers and Veronica Powers. Patient lives with daughter, Veronica Powers and Veronica Powers's husband. CSW introduced self and referral for possible SNF placement. CSW educated family on skilled care and reasons for referral. Daughter, Veronica Powers states patient is a Scientist, clinical (histocompatibility and immunogenetics) patient and has [facility] provide assistance to patient in the home. Daughter, her husband and patientis grandchildren are able to provide 24 hour assistance to patient and prefer patient return home with home health services. CSW notified RN CM of need for referral for home health care services.   Assessment/plan status:  Information/Referral to [facility] Other assessment/ plan:   Information/referral to community resources:    PATIENT'S/FAMILY'S RESPONSE TO PLAN OF CARE: Patient's daughter thanked CSW for referral to RN CM for home health upon discharge. No other needs or concerns at this time. CSW signing off.    Veronica Powers, [place] [phone] (weekend)

## 2012-04-02 NOTE — Progress Notes (Addendum)
[facility] PROGRESS NOTE  Veronica Powers YQM:[id number] DOB: Jun 05, 1925 DOA: 03/29/2012  PCP: Laurena Slimmer, MD  Brief HPI: Veronica Powers is a 79 y.o. female with a past medical history of chronic diastolic heart failure,paroxysmal A. Fib currently on Coumadin recently discharge from the hospital on January 2014 right parotitis. During that admission her Lasix was increased. She went to see her primary care doctor and at that time her potassium was 6.0 her creatinine was 2.5. She was feeling weak. She also had paroxysmal A. Fib in the 160s. In the emergency room she was given diltiazem and she converted back into sinus rhythm. Patient the patient denied any shortness of breath chest pain or swelling.    Past medical history:  Past Medical History  Diagnosis Date  . Atrial fibrillation     permanent  . Atrial fibrillation with controlled ventricular response   . Aortic stenosis, moderate   . Diabetes     type 2  . Stroke     left sided residual  . CHF (congestive heart failure)   . Hypertension   . Gout   . Anginal pain   . Shortness of breath   . GERD (gastroesophageal reflux disease)     Consultants: [facility] Cards  Procedures: None  Antibiotics: None  Subjective: Patient feels well. No complaints offered today. Will still like to go home instead of SNF. Discussed with her daughter and they will take her home when she is ready.  Objective: Vital Signs  Filed Vitals:   04/01/12 1015 04/01/12 1700 04/01/12 2212 04/02/12 0649  BP: 160/100 161/99 172/91 170/109  Pulse: 109 104 100 84  Temp:  98.2 F (36.8 C) 97.9 F (36.6 C) 98 F (36.7 C)  TempSrc:  Oral Oral Oral  Resp:  21    Height:      Weight:      SpO2:  94% 94% 100%    Intake/Output Summary (Last 24 hours) at 04/02/12 1036 Last data filed at 04/02/12 0900  Gross per 24 hour  Intake    360 ml  Output      0 ml  Net    360 ml   Filed Weights   03/30/12 0500 03/31/12 0630 04/01/12 0500  Weight:  62.914 kg (138 lb 11.2 oz) 53.706 kg (118 lb 6.4 oz) 57.38 kg (126 lb 8 oz)   Tele: Improved HR in 80's, afib  General appearance: alert, cooperative, appears stated age and no distress Resp: clear to auscultation bilaterally Cardio: irregular rhythm, normal rate, no murmur, click, rub or gallop GI: soft, non-tender; bowel sounds normal; no masses,  no organomegaly Extremities: extremities normal, atraumatic, no cyanosis or edema Pulses: 2+ and symmetric  Lab Results:  Basic Metabolic Panel:  Recent Labs Lab 03/29/12 1316 03/30/12 0730 03/31/12 0502 04/01/12 0640 04/02/12 0520  NA 133* 137 136 136 137  K 3.2* 3.1* 3.9 4.3 4.0  CL 93* 101 105 103 104  CO2 24 21 18* 21 22  GLUCOSE 143* 97 117* 116* 122*  BUN 22 22 18 14 14   CREATININE 1.46* 1.19* 0.90 0.98 0.97  CALCIUM 9.3 8.3* 8.3* 8.8 8.8   Liver Function Tests:  Recent Labs Lab 03/30/12 0730  AST 29  ALT 22  ALKPHOS 64  BILITOT 0.8  PROT 6.2  ALBUMIN 2.5*   CBC:  Recent Labs Lab 03/29/12 1316 03/30/12 0730 03/31/12 0502 04/02/12 0520  WBC 10.7* 7.0 5.3 5.1  NEUTROABS 8.2*  --   --   --  HGB 11.3* 8.4* 9.5* 8.6*  HCT 32.8* 25.0* 27.1* 25.6*  MCV 86.1 87.1 87.7 87.1  PLT 286 274 294 321   Cardiac Enzymes:  Recent Labs Lab 03/29/12 1936 03/30/12 0038 03/30/12 0731 03/31/12 0502  TROPONINI 6.85* 5.55* 5.02* 3.01*   BNP (last 3 results)  Recent Labs  02/19/12 1050  PROBNP 1951.0*   CBG:  Recent Labs Lab 04/01/12 0745 04/01/12 1155 04/01/12 1657 04/01/12 2212 04/02/12 0754  GLUCAP 110* 110* 118* 103* 117*    Recent Results (from the past 240 hour(s))  MRSA PCR SCREENING     Status: Abnormal   Collection Time    03/29/12  8:29 PM      Result Value Range Status   MRSA by PCR POSITIVE (*) NEGATIVE Final   Comment:            The GeneXpert MRSA Assay (FDA     approved for NASAL specimens     only), is one component of a     comprehensive MRSA colonization     surveillance  program. It is not     intended to diagnose MRSA     infection nor to guide or     monitor treatment for     MRSA infections.     RESULT CALLED TO, READ BACK BY AND VERIFIED WITH:     Veronica Litter RN [id number] 03/29/12 A BROWNING      Studies/Results: No results found.  Medications:  Scheduled: . aspirin EC  81 mg Oral Daily  . atorvastatin  20 mg Oral Daily  . brimonidine  1 drop Both Eyes BID  . brinzolamide  1 drop Both Eyes BID  . Chlorhexidine Gluconate Cloth  6 each Topical Q0600  . docusate sodium  100 mg Oral BID  . guanFACINE  2 mg Oral QHS  . lisinopril  10 mg Oral Daily  . multivitamin with minerals  1 tablet Oral Daily  . mupirocin ointment  1 application Nasal BID  . pilocarpine  1 drop Both Eyes QID  . pindolol  5 mg Oral BID  . polyethylene glycol  17 g Oral BID  . sodium chloride  3 mL Intravenous Q12H  . Warfarin - Pharmacist Dosing Inpatient   Does not apply q1800   Continuous:   XLK:[id number], acetaminophen, colchicine, guaiFENesin, ondansetron (ZOFRAN) IV, ondansetron, oxybutynin, polyvinyl alcohol  Assessment/Plan:  Principal Problem:   Acute kidney injury Active Problems:   HYPERTENSION, HEART CONTROLLED W/O ASSOC CHF   AORTIC STENOSIS   Chronic anticoagulation   Hypokalemia   Weakness   Diabetes   Atrial fibrillation with rapid ventricular response    Acute kidney injury with weakness Renal function is back to baseline. ARf likely due to diuretics that were added to her regimen during previous hospitalization. Stopped Indocin she's also taking home which may be contributing to her acute kidney injury.   Atrial fibrillation with rapid ventricular response and elevated troponins:  Was tachycardic most of the day yesterday. Started on Pindolol by cardiology. HR is now improved. Due to her history of bradycardia cardiology wants to wait till 3/16 before discharging her. Troponin did elevate significantly. Patient denies chest pain. She likely had  NSTEMI due to presenting issues. Aspirin added. No further intervention. INR is therapeutic. Did have an ECHO in feb.  Anemia Acute drop in Hgb noted 3/12 but stable today. Likely dilutional. No overt bleeding. Check FOBT. Monitor Hgb closely.  HYPERTENSION BP continues to be high. Since renal function is  back to normal will initiate ACEI.  Chronic anticoagulation  Coumadin per pharmacy. INR is therapeutic.  Hypokalemia:  Repleted  Code Status: full code  Family Communication: Discussed with patient and daughter Disposition Plan: PT/OT recommends SNF but daughter states she can provide 24 hr care at home. Will order home health. Anticipate discharge 3/16 per cards recommendation if HR remains stable.Marland Kitchen  DVT Prophylaxis On warfarin and heparin   LOS: 4 days   [facility]  [facility] Pager [phone] 04/02/2012, 10:36 AM  If 8PM-8AM, please contact night-coverage at [email or web address], password [email or web address]   Was notified that patient has moderate to severe AS on recent ECHO. Hence we will not use ACEi and resume home dose of hydralazine instead.  Kenna Seward 11:50 AM

## 2012-04-03 LAB — GLUCOSE, CAPILLARY
Glucose-Capillary: 155 mg/dL — ABNORMAL HIGH (ref 70–99)
Glucose-Capillary: 150 mg/dL — ABNORMAL HIGH (ref 70–99)

## 2012-04-03 LAB — BASIC METABOLIC PANEL
CO2: 25 mEq/L (ref 19–32)
Chloride: 105 mEq/L (ref 96–112)
GFR calc non Af Amer: 43 mL/min — ABNORMAL LOW (ref >90)
Glucose, Bld: 123 mg/dL — ABNORMAL HIGH (ref 70–99)
Potassium: 4.2 mEq/L (ref 3.5–5.1)
Sodium: 140 mEq/L (ref 135–145)

## 2012-04-03 MED ORDER — POLYETHYLENE GLYCOL 3350 17 G PO PACK: 17.0000 g | PACK | Freq: Every day | ORAL | Status: AC | PRN

## 2012-04-03 MED ORDER — WARFARIN SODIUM 2.5 MG PO TABS
2.5000 mg | ORAL_TABLET | Freq: Once | ORAL | Status: DC
  Filled 2012-04-03: qty 1

## 2012-04-03 MED ORDER — ASPIRIN 81 MG PO TBEC: 81.0000 mg | DELAYED_RELEASE_TABLET | Freq: Every day | ORAL | Status: AC

## 2012-04-03 MED ORDER — DSS 100 MG PO CAPS: 100.0000 mg | ORAL_CAPSULE | Freq: Two times a day (BID) | ORAL | Status: AC

## 2012-04-03 MED ORDER — PINDOLOL 5 MG PO TABS: 5.0000 mg | ORAL_TABLET | Freq: Two times a day (BID) | ORAL | Status: DC

## 2012-04-03 NOTE — Progress Notes (Signed)
ANTICOAGULATION CONSULT NOTE  Pharmacy Consult for Coumadin Indication: atrial fibrillation  Allergies  Allergen Reactions  . Nifedipine     REACTION: made her weak   Labs:  Recent Labs  04/01/12 0640 04/02/12 0520 04/03/12 0547  HGB  --  8.6*  --   HCT  --  25.6*  --   PLT  --  321  --   LABPROT 25.1* 26.4* 26.1*  INR 2.41* 2.58* 2.54*  CREATININE 0.98 0.97 1.14*    Estimated Creatinine Clearance: 28.1 ml/min (by C-G formula based on Cr of 1.14).  Assessment:   79 yo female admitted 03/29/2012   with weakness and afib CVR 80s on pindolol. Patient is on coumadin PTA for PAF pharmacy consulted to continue while admitted. INR today is 2.54 at goal 2-3. Slight drop in h/h from admit but no bleeding noted - watch closely.  Home dose: 5mg  MWF, 2.5mg  TTSS  Goal of Therapy:  INR 2-3 Heparin level 0.3-0.7 units/ml Monitor platelets by anticoagulation protocol: Yes   Plan:  Repeat Warfarin 2.5 mg Daily INR  Veronica Powers Pharm.D. CPP, BCPS Clinical Pharmacist [phone] 04/03/2012 9:10 AM

## 2012-04-03 NOTE — Discharge Summary (Signed)
[facility]  Physician Discharge Summary   Patient ID: Veronica Powers MRN: [id number] DOB/AGE: October 17, 1925 79 y.o.  Admit date: 03/29/2012 Discharge date: 04/03/2012  PCP: Laurena Slimmer, MD  DISCHARGE DIAGNOSES:  Active Problems:   HYPERTENSION, HEART CONTROLLED W/O ASSOC CHF   AORTIC STENOSIS   Atrial fibrillation   Chronic anticoagulation   Hypokalemia   Weakness   Diabetes   RECOMMENDATIONS FOR OUTPATIENT FOLLOW UP: 1. Will need close follow up with Dr. Marikay Alar 2. HH to check PT/INr and bmet  DISCHARGE CONDITION: fair  Diet recommendation: Heat Healthy  Filed Weights   03/31/12 0630 04/01/12 0500 04/03/12 0449  Weight: 53.706 kg (118 lb 6.4 oz) 57.38 kg (126 lb 8 oz) 56.065 kg (123 lb 9.6 oz)    INITIAL HISTORY: Veronica Powers is a 79 y.o. female with a past medical history of chronic diastolic heart failure, paroxysmal A. Fib currently on Coumadin recently discharged from the hospital on January 2014 for right parotitis. During that admission her Lasix was increased. She went to see her primary care doctor and at that time her potassium was 6.0 her creatinine was 2.5. She was feeling weak. She also had paroxysmal A. Fib in the 160s. In the emergency room she was given diltiazem and she converted back into sinus rhythm. Patient denied any shortness of breath chest pain or swelling.   Consultations:  [facility] Cardiology  Procedures:  None  HOSPITAL COURSE:   Acute kidney injury with weakness  Renal function is close to baseline though creatinine is slightly high today. She is making urine. Will have HH check BMet at home. ARf was likely due to diuretics that were added to her regimen during previous hospitalization. Stopped Indocin she's also taking home which may be contributing to her acute kidney injury.   Atrial fibrillation with rapid ventricular response and elevated troponins:  Has remained tachycardic for the most part. Pindolol was added by cardiology on  3/14. HR has remained reasonably well controlled. Due to history of symptomatic bradycardia in past there is caution utilized with these agents. If HR remains poorly controlled she may need pacing.   NSTEMI Troponin did elevate significantly. Patient denied chest pain. She likely had NSTEMI due to presenting issues. Aspirin was added. No further intervention. INR is therapeutic. Did have an ECHO in feb.   Anemia  Acute drop in Hgb noted 3/12 but stable subsequently. Drop was likely dilutional. No overt bleeding. FOBT was ordered but not done. To be pursued as OP.   HYPERTENSION  Resume Hydralazine.  Chronic anticoagulation  Coumadin per pharmacy. INR is therapeutic. HH to check PT/INR at home.  Hypokalemia:  Was repleted.  Patient remains stable overall. Denies any complaints. She and family have refused placement to SNF. They feel confident of taking care of her at home. She has ATC care. Will order Home health. She is ready for discharge and will need close follow up with cardiology.   PERTINENT LABS:  The results of significant diagnostics from this hospitalization (including imaging, microbiology, ancillary and laboratory) are listed below for reference.    Microbiology: Recent Results (from the past 240 hour(s))  MRSA PCR SCREENING     Status: Abnormal   Collection Time    03/29/12  8:29 PM      Result Value Range Status   MRSA by PCR POSITIVE (*) NEGATIVE Final   Comment:            The GeneXpert MRSA Assay (FDA     approved  for NASAL specimens     only), is one component of a     comprehensive MRSA colonization     surveillance program. It is not     intended to diagnose MRSA     infection nor to guide or     monitor treatment for     MRSA infections.     RESULT CALLED TO, READ BACK BY AND VERIFIED WITH:     Johnnette Litter RN [id number] 03/29/12 A BROWNING     Labs: Basic Metabolic Panel:  Recent Labs Lab 03/30/12 0730 03/31/12 0502 04/01/12 0640 04/02/12 0520  04/03/12 0547  NA 137 136 136 137 140  K 3.1* 3.9 4.3 4.0 4.2  CL 101 105 103 104 105  CO2 21 18* 21 22 25   GLUCOSE 97 117* 116* 122* 123*  BUN 22 18 14 14 11   CREATININE 1.19* 0.90 0.98 0.97 1.14*  CALCIUM 8.3* 8.3* 8.8 8.8 8.9   Liver Function Tests:  Recent Labs Lab 03/30/12 0730  AST 29  ALT 22  ALKPHOS 64  BILITOT 0.8  PROT 6.2  ALBUMIN 2.5*   CBC:  Recent Labs Lab 03/29/12 1316 03/30/12 0730 03/31/12 0502 04/02/12 0520  WBC 10.7* 7.0 5.3 5.1  NEUTROABS 8.2*  --   --   --   HGB 11.3* 8.4* 9.5* 8.6*  HCT 32.8* 25.0* 27.1* 25.6*  MCV 86.1 87.1 87.7 87.1  PLT 286 274 294 321   Cardiac Enzymes:  Recent Labs Lab 03/29/12 1936 03/30/12 0038 03/30/12 0731 03/31/12 0502  TROPONINI 6.85* 5.55* 5.02* 3.01*   BNP: BNP (last 3 results)  Recent Labs  02/19/12 1050  PROBNP 1951.0*   CBG:  Recent Labs Lab 04/02/12 0754 04/02/12 1143 04/02/12 1634 04/02/12 2016 04/03/12 0817  GLUCAP 117* 136* 114* 142* 155*     IMAGING STUDIES Dg Chest 2 View  03/29/2012  *RADIOLOGY REPORT*  Clinical Data: Near syncope  CHEST - 2 VIEW  Comparison: 02/19/2012  Findings: Mild cardiac enlargement is stable.  Decreased lung volumes.  No pleural effusion or edema.  No airspace consolidation identified.  IMPRESSION:  1.  No acute cardiopulmonary abnormalities   Original Report Authenticated By: Signa Kell, M.D.     DISCHARGE EXAMINATION: Filed Vitals:   04/02/12 1357 04/02/12 2018 04/02/12 2300 04/03/12 0449  BP: 170/103 167/103 148/86 166/96  Pulse: 90 91  89  Temp: 98 F (36.7 C) 97.9 F (36.6 C)  97.9 F (36.6 C)  TempSrc: Oral Oral  Oral  Resp:  20  20  Height:      Weight:    56.065 kg (123 lb 9.6 oz)  SpO2: 97% 99%  100%   General appearance: alert, cooperative and no distress Resp: clear to auscultation bilaterally Cardio: Irregularly irregular, Systolic murmur at aortic area.  GI: soft, non-tender; bowel sounds normal; no masses,  no  organomegaly Neurologic: No new changes  DISPOSITION: Home with family  Discharge Orders   Future Appointments Provider Department Dept Phone   04/07/2012 12:15 PM Duke Salvia, MD [facility] Main Office [place]) [phone]   Future Orders Complete By Expires     Diet - low sodium heart healthy  As directed     Discharge instructions  As directed     Comments:      Please follow up with your heart doctor within 1-2 weeks. Have the PT/INR (coumadin 'level') checked in 3 days. Seek attention if you develop chest pain, shortness of breath, dizziness or any other  discomfort.    Increase activity slowly  As directed       Current Discharge Medication List    START taking these medications   Details  aspirin EC 81 MG EC tablet Take 1 tablet (81 mg total) by mouth daily. Qty: 30 tablet, Refills: 3    docusate sodium 100 MG CAPS Take 100 mg by mouth 2 (two) times daily. Qty: 60 capsule, Refills: 0    pindolol (VISKEN) 5 MG tablet Take 1 tablet (5 mg total) by mouth 2 (two) times daily. Qty: 60 tablet, Refills: 0    polyethylene glycol (MIRALAX / GLYCOLAX) packet Take 17 g by mouth daily as needed (for constipation). Qty: 14 each, Refills: 0      CONTINUE these medications which have NOT CHANGED   Details  allopurinol (ZYLOPRIM) 100 MG tablet Take 100 mg by mouth daily.     atorvastatin (LIPITOR) 20 MG tablet Take 20 mg by mouth daily.      bimatoprost (LUMIGAN) 0.03 % ophthalmic solution Place 1 drop into both eyes at bedtime.      brimonidine (ALPHAGAN) 0.15 % ophthalmic solution Place 1 drop into both eyes 2 (two) times daily.      brinzolamide (AZOPT) 1 % ophthalmic suspension Place 1 drop into both eyes 2 (two) times daily.      carboxymethylcellulose (REFRESH PLUS) 0.5 % SOLN Place 1 drop into both eyes as needed.      COLCRYS 0.6 MG tablet Take 0.6 mg by mouth 2 (two) times daily. prn    fluticasone (FLONASE) 50 MCG/ACT nasal spray Place 2 sprays into the nose  daily as needed for allergies.     guaiFENesin (MUCINEX) 600 MG 12 hr tablet Take 1,200 mg by mouth daily as needed for congestion.    guanFACINE (TENEX) 2 MG tablet Take 2 mg by mouth at bedtime.    hydrALAZINE (APRESOLINE) 10 MG tablet Take 20 mg by mouth 2 (two) times daily.     Multiple Vitamin (MULTIVITAMIN WITH MINERALS) TABS Take 1 tablet by mouth daily.    oxybutynin (DITROPAN-XL) 10 MG 24 hr tablet Take 10 mg by mouth daily as needed. For bladder control    pilocarpine (PILOCAR) 4 % ophthalmic solution Place 1 drop into both eyes 4 (four) times daily.      warfarin (COUMADIN) 5 MG tablet Take 5 mg by mouth every evening. Take 1 tablet on Mon, Wed, and Fri.  Take 0.5 tablet the rest of the week      STOP taking these medications     doxycycline (VIBRAMYCIN) 100 MG capsule      indomethacin (INDOCIN) 25 MG capsule        Follow-up Information   Follow up with Sherryl Manges, MD. Schedule an appointment as soon as possible for a visit in 1 week.   Contact information:   [address] [place] [address] [phone]       Call Laurena Slimmer, MD. (As needed)    Contact information:   [address] [place] [address] [phone]       TOTAL DISCHARGE TIME: 35 mins  [facility]  [facility] Pager [phone]  04/03/2012, 9:58 AM

## 2012-04-04 NOTE — Progress Notes (Signed)
   CARE MANAGEMENT NOTE 04/04/2012  Patient:  Veronica Powers, Veronica Powers   Account Number:  [id number]  Date Initiated:  04/04/2012  Documentation initiated by:  [facility]  Subjective/Objective Assessment:   HYPERTENSION, HEART CONTROLLED W/O ASSOC CHF     Action/Plan:   lives at home with Dtr, Ruthie   Anticipated DC Date:  04/03/2012   Anticipated DC Plan:  HOME W HOME HEALTH SERVICES      DC Planning Services  CM consult      [facility] Choice  HOME HEALTH   Choice offered to / List presented to:  C-4 Adult Children        HH arranged  HH-1 RN  HH-2 PT      [facility] agency  [facility]   Status of service:  Completed, signed off Medicare Important Message given?   (If response is "NO", the following Medicare IM given date fields will be blank) Date Medicare IM given:   Date Additional Medicare IM given:    Discharge Disposition:  HOME W HOME HEALTH SERVICES  Per UR Regulation:    If discussed at Long Length of Stay Meetings, dates discussed:    Comments:  04/04/212 0845 NCM spoke to dtr, Ruthie # [phone] and she requested [facility] for [facility]. Pt had [facility] in the past. Referral sent to [facility] PT/RN with lab draw on 3/19. Dtr states mother is doing well at home.   Isidoro Donning RN CCM Case Mgmt phone [phone]

## 2012-04-07 ENCOUNTER — Ambulatory Visit (INDEPENDENT_AMBULATORY_CARE_PROVIDER_SITE_OTHER): Payer: PRIVATE HEALTH INSURANCE | Admitting: Internal Medicine

## 2012-04-07 ENCOUNTER — Ambulatory Visit (INDEPENDENT_AMBULATORY_CARE_PROVIDER_SITE_OTHER): Payer: PRIVATE HEALTH INSURANCE

## 2012-04-07 ENCOUNTER — Encounter: Payer: Self-pay | Admitting: Internal Medicine

## 2012-04-07 VITALS — BP 129/78 | HR 136

## 2012-04-07 DIAGNOSIS — Z7901 Long term (current) use of anticoagulants: Secondary | ICD-10-CM

## 2012-04-07 DIAGNOSIS — I4891 Unspecified atrial fibrillation: Secondary | ICD-10-CM

## 2012-04-07 DIAGNOSIS — I498 Other specified cardiac arrhythmias: Secondary | ICD-10-CM

## 2012-04-07 LAB — POCT INR: INR: 1.9

## 2012-04-07 MED ORDER — PINDOLOL 10 MG PO TABS: 10.0000 mg | ORAL_TABLET | Freq: Two times a day (BID) | ORAL | Status: AC

## 2012-04-07 NOTE — Patient Instructions (Addendum)
Your physician has recommended that you wear a 24 hour holter monitor. Holter monitors are medical devices that record the heart's electrical activity. Doctors most often use these monitors to diagnose arrhythmias. Arrhythmias are problems with the speed or rhythm of the heartbeat. The monitor is a small, portable device. You can wear one while you do your normal daily activities. This is usually used to diagnose what is causing palpitations/syncope (passing out). To be placed on her Coumadin appointment date.  Your physician has recommended you make the following change in your medication: Increase Pindolol to 10 mg twice a day.  Your physician recommends that you schedule a follow-up appointment in: 3 months Dr. Marikay Alar

## 2012-04-07 NOTE — Progress Notes (Signed)
Patient Care Team: Laurena Slimmer, MD as PCP - General (Endocrinology)   HPI  Veronica Powers is a 79 y.o. female Seen in followup for atrial fibrillation with tachybradycardia problems. She also has moderate to severe aortic stenosis and chronic HFpEF. She is a history of stroke and hypertension.  She was just hospitalized. We tried to get her back on rate control and hope to avoid bradycardia. We started her on pindolol.  Past Medical History  Diagnosis Date  . Atrial fibrillation     permanent  . Atrial fibrillation with controlled ventricular response   . Aortic stenosis, moderate   . Diabetes     type 2  . Stroke     left sided residual  . CHF (congestive heart failure)   . Hypertension   . Gout   . Anginal pain   . Shortness of breath   . GERD (gastroesophageal reflux disease)     Past Surgical History  Procedure Laterality Date  . Cholecystectomy    . Cesarean section      Current Outpatient Prescriptions  Medication Sig Dispense Refill  . allopurinol (ZYLOPRIM) 100 MG tablet Take 100 mg by mouth daily.       Marland Kitchen aspirin EC 81 MG EC tablet Take 1 tablet (81 mg total) by mouth daily.  30 tablet  3  . atorvastatin (LIPITOR) 20 MG tablet Take 20 mg by mouth daily.        . bimatoprost (LUMIGAN) 0.03 % ophthalmic solution Place 1 drop into both eyes at bedtime.        . brimonidine (ALPHAGAN) 0.15 % ophthalmic solution Place 1 drop into both eyes 2 (two) times daily.        . brinzolamide (AZOPT) 1 % ophthalmic suspension Place 1 drop into both eyes 2 (two) times daily.        . carboxymethylcellulose (REFRESH PLUS) 0.5 % SOLN Place 1 drop into both eyes as needed.        . COLCRYS 0.6 MG tablet Take 0.6 mg by mouth 2 (two) times daily. prn      . docusate sodium 100 MG CAPS Take 100 mg by mouth 2 (two) times daily.  60 capsule  0  . fluticasone (FLONASE) 50 MCG/ACT nasal spray Place 2 sprays into the nose daily as needed for allergies.       Marland Kitchen guaiFENesin (MUCINEX) 600  MG 12 hr tablet Take 1,200 mg by mouth daily as needed for congestion.      Marland Kitchen guanFACINE (TENEX) 2 MG tablet Take 2 mg by mouth at bedtime.      . hydrALAZINE (APRESOLINE) 10 MG tablet Take 20 mg by mouth 2 (two) times daily.       . Multiple Vitamin (MULTIVITAMIN WITH MINERALS) TABS Take 1 tablet by mouth daily.      Marland Kitchen oxybutynin (DITROPAN-XL) 10 MG 24 hr tablet Take 10 mg by mouth daily as needed. For bladder control      . pilocarpine (PILOCAR) 4 % ophthalmic solution Place 1 drop into both eyes 4 (four) times daily.        . pindolol (VISKEN) 5 MG tablet Take 1 tablet (5 mg total) by mouth 2 (two) times daily.  60 tablet  0  . polyethylene glycol (MIRALAX / GLYCOLAX) packet Take 17 g by mouth daily as needed (for constipation).  14 each  0  . warfarin (COUMADIN) 5 MG tablet Take 5 mg by mouth every evening. Take 1 tablet on  Mon, Wed, and Fri.  Take 0.5 tablet the rest of the week       No current facility-administered medications for this visit.    Allergies  Allergen Reactions  . Nifedipine     REACTION: made her weak    Review of Systems negative except from HPI and PMH  Physical Exam BP 129/78  Pulse 136   Well developed and nourished tired old lady sitting in a wheel chair in no acute distress HENT normal Neck supple Clear Rapid and irregular rate, no murmurs or gallops Abd-soft with active BS No Clubbing cyanosis edema Skin-warm and dry A & Oriented  hemiparei  ECG  afib  With rapid ventricular response   Assessment and  Plan

## 2012-04-07 NOTE — Assessment & Plan Note (Signed)
persisitent will increse pindolol and get a holter   i think we are closing in on need for pacer as brady is limiting our ability to control afib With rapid ventricular response.

## 2012-04-07 NOTE — Assessment & Plan Note (Signed)
As above.

## 2012-04-21 ENCOUNTER — Ambulatory Visit (INDEPENDENT_AMBULATORY_CARE_PROVIDER_SITE_OTHER): Payer: PRIVATE HEALTH INSURANCE

## 2012-04-21 ENCOUNTER — Ambulatory Visit (INDEPENDENT_AMBULATORY_CARE_PROVIDER_SITE_OTHER): Payer: PRIVATE HEALTH INSURANCE | Admitting: *Deleted

## 2012-04-21 DIAGNOSIS — Z7901 Long term (current) use of anticoagulants: Secondary | ICD-10-CM

## 2012-04-21 DIAGNOSIS — I4891 Unspecified atrial fibrillation: Secondary | ICD-10-CM

## 2012-04-21 LAB — POCT INR: INR: 1.7

## 2012-04-21 NOTE — Progress Notes (Signed)
Placed a 24 hr holter monitor on patient and went over instructions on how to use it and when to return it

## 2012-04-28 ENCOUNTER — Other Ambulatory Visit: Payer: Self-pay | Admitting: *Deleted

## 2012-04-28 MED ORDER — HYDRALAZINE HCL 10 MG PO TABS: 20.0000 mg | ORAL_TABLET | Freq: Two times a day (BID) | ORAL | Status: DC

## 2012-04-29 ENCOUNTER — Telehealth: Payer: Self-pay | Admitting: Internal Medicine

## 2012-04-29 NOTE — Telephone Encounter (Signed)
Spoke with catherine, she was calling to report how the pt was doing and requested I call the pts home. Left message at pts home number for someone to call me back.

## 2012-04-29 NOTE — Telephone Encounter (Signed)
New problem    Catherine/[facility]-Physical Therapist: vitals on pt lying down BP 164/100.Marland KitchenMarland KitchenMarland Kitchenpulse 97...Marland KitchenMarland Kitchenoxygen 99.......sitting down BP 168/10...hr 80-128.Marland Kitchenrespiratory rate 32.Marland KitchenMarland KitchenPlease call Santina Evans.

## 2012-04-29 NOTE — Telephone Encounter (Signed)
Spoke with dtr, the pt is feeling good today. She has had her medicine. They have a follow up appt on the 18th. Will make sure monitor results are available at that appt. The dtr states they do not need anything at this time.

## 2012-05-04 ENCOUNTER — Telehealth: Payer: Self-pay | Admitting: *Deleted

## 2012-05-04 NOTE — Telephone Encounter (Signed)
Left message for pt to call, pharm is requesting a refill for benazepril 20 mg once daily. This med is not on the pts medicine list.

## 2012-05-05 ENCOUNTER — Telehealth: Payer: Self-pay | Admitting: *Deleted

## 2012-05-05 ENCOUNTER — Ambulatory Visit (INDEPENDENT_AMBULATORY_CARE_PROVIDER_SITE_OTHER): Payer: PRIVATE HEALTH INSURANCE | Admitting: *Deleted

## 2012-05-05 DIAGNOSIS — Z7901 Long term (current) use of anticoagulants: Secondary | ICD-10-CM

## 2012-05-05 DIAGNOSIS — I4891 Unspecified atrial fibrillation: Secondary | ICD-10-CM

## 2012-05-05 NOTE — Telephone Encounter (Signed)
I spoke with the patient's daughter, Windell Moulding. She is aware of the patient's heart monitor results. Per Dr. Graciela Husbands "surprisingly not too bradycardic & mean HR < 90 bpm." Rate stats show - min HR - 47 bpm, max HR- 145 bpm, and mean HR- 87 bpm.

## 2012-05-25 ENCOUNTER — Emergency Department (HOSPITAL_COMMUNITY): Payer: PRIVATE HEALTH INSURANCE

## 2012-05-25 ENCOUNTER — Encounter (HOSPITAL_COMMUNITY): Payer: Self-pay | Admitting: Emergency Medicine

## 2012-05-25 ENCOUNTER — Inpatient Hospital Stay (HOSPITAL_COMMUNITY)
Admission: EM | Admit: 2012-05-25 | Discharge: 2012-05-29 | DRG: 563 | Disposition: A | Discharge status: Home / Self Care | Payer: PRIVATE HEALTH INSURANCE | Attending: Internal Medicine | Admitting: Internal Medicine

## 2012-05-25 DIAGNOSIS — E119 Type 2 diabetes mellitus without complications: Secondary | ICD-10-CM | POA: Diagnosis present

## 2012-05-25 DIAGNOSIS — I5022 Chronic systolic (congestive) heart failure: Secondary | ICD-10-CM | POA: Diagnosis present

## 2012-05-25 DIAGNOSIS — R627 Adult failure to thrive: Secondary | ICD-10-CM | POA: Diagnosis present

## 2012-05-25 DIAGNOSIS — I509 Heart failure, unspecified: Secondary | ICD-10-CM | POA: Diagnosis present

## 2012-05-25 DIAGNOSIS — S42302D Unspecified fracture of shaft of humerus, left arm, subsequent encounter for fracture with routine healing: Secondary | ICD-10-CM

## 2012-05-25 DIAGNOSIS — W19XXXA Unspecified fall, initial encounter: Secondary | ICD-10-CM

## 2012-05-25 DIAGNOSIS — F0390 Unspecified dementia without behavioral disturbance: Secondary | ICD-10-CM | POA: Diagnosis present

## 2012-05-25 DIAGNOSIS — I4891 Unspecified atrial fibrillation: Secondary | ICD-10-CM | POA: Diagnosis present

## 2012-05-25 DIAGNOSIS — K219 Gastro-esophageal reflux disease without esophagitis: Secondary | ICD-10-CM | POA: Diagnosis present

## 2012-05-25 DIAGNOSIS — K1120 Sialoadenitis, unspecified: Secondary | ICD-10-CM

## 2012-05-25 DIAGNOSIS — Z66 Do not resuscitate: Secondary | ICD-10-CM | POA: Diagnosis present

## 2012-05-25 DIAGNOSIS — Y92009 Unspecified place in unspecified non-institutional (private) residence as the place of occurrence of the external cause: Secondary | ICD-10-CM

## 2012-05-25 DIAGNOSIS — E876 Hypokalemia: Secondary | ICD-10-CM

## 2012-05-25 DIAGNOSIS — Z7901 Long term (current) use of anticoagulants: Secondary | ICD-10-CM

## 2012-05-25 DIAGNOSIS — I119 Hypertensive heart disease without heart failure: Secondary | ICD-10-CM | POA: Diagnosis present

## 2012-05-25 DIAGNOSIS — M109 Gout, unspecified: Secondary | ICD-10-CM | POA: Diagnosis present

## 2012-05-25 DIAGNOSIS — S42202A Unspecified fracture of upper end of left humerus, initial encounter for closed fracture: Secondary | ICD-10-CM

## 2012-05-25 DIAGNOSIS — N183 Chronic kidney disease, stage 3 unspecified: Secondary | ICD-10-CM | POA: Diagnosis present

## 2012-05-25 DIAGNOSIS — R7989 Other specified abnormal findings of blood chemistry: Secondary | ICD-10-CM | POA: Diagnosis present

## 2012-05-25 DIAGNOSIS — I359 Nonrheumatic aortic valve disorder, unspecified: Secondary | ICD-10-CM | POA: Diagnosis present

## 2012-05-25 DIAGNOSIS — Z8673 Personal history of transient ischemic attack (TIA), and cerebral infarction without residual deficits: Secondary | ICD-10-CM

## 2012-05-25 DIAGNOSIS — N179 Acute kidney failure, unspecified: Secondary | ICD-10-CM | POA: Diagnosis present

## 2012-05-25 DIAGNOSIS — S42302A Unspecified fracture of shaft of humerus, left arm, initial encounter for closed fracture: Secondary | ICD-10-CM

## 2012-05-25 DIAGNOSIS — Z22322 Carrier or suspected carrier of Methicillin resistant Staphylococcus aureus: Secondary | ICD-10-CM

## 2012-05-25 DIAGNOSIS — W010XXA Fall on same level from slipping, tripping and stumbling without subsequent striking against object, initial encounter: Secondary | ICD-10-CM | POA: Diagnosis present

## 2012-05-25 DIAGNOSIS — S42293A Other displaced fracture of upper end of unspecified humerus, initial encounter for closed fracture: Principal | ICD-10-CM | POA: Diagnosis present

## 2012-05-25 DIAGNOSIS — E860 Dehydration: Secondary | ICD-10-CM | POA: Diagnosis present

## 2012-05-25 DIAGNOSIS — R9431 Abnormal electrocardiogram [ECG] [EKG]: Secondary | ICD-10-CM

## 2012-05-25 DIAGNOSIS — R531 Weakness: Secondary | ICD-10-CM

## 2012-05-25 DIAGNOSIS — I130 Hypertensive heart and chronic kidney disease with heart failure and stage 1 through stage 4 chronic kidney disease, or unspecified chronic kidney disease: Secondary | ICD-10-CM | POA: Diagnosis present

## 2012-05-25 DIAGNOSIS — I498 Other specified cardiac arrhythmias: Secondary | ICD-10-CM

## 2012-05-25 DIAGNOSIS — I252 Old myocardial infarction: Secondary | ICD-10-CM

## 2012-05-25 HISTORY — DX: Chronic systolic (congestive) heart failure: I50.22

## 2012-05-25 HISTORY — DX: Bradycardia, unspecified: R00.1

## 2012-05-25 LAB — URINALYSIS, ROUTINE W REFLEX MICROSCOPIC
Glucose, UA: NEGATIVE mg/dL
Hgb urine dipstick: NEGATIVE
Ketones, ur: 15 mg/dL — AB
pH: 5 (ref 5.0–8.0)

## 2012-05-25 LAB — URINE MICROSCOPIC-ADD ON

## 2012-05-25 LAB — GLUCOSE, CAPILLARY
Glucose-Capillary: 118 mg/dL — ABNORMAL HIGH (ref 70–99)
Glucose-Capillary: 109 mg/dL — ABNORMAL HIGH (ref 70–99)

## 2012-05-25 LAB — COMPREHENSIVE METABOLIC PANEL
Alkaline Phosphatase: 71 U/L (ref 39–117)
BUN: 25 mg/dL — ABNORMAL HIGH (ref 6–23)
GFR calc Af Amer: 35 mL/min — ABNORMAL LOW (ref >90)
Glucose, Bld: 141 mg/dL — ABNORMAL HIGH (ref 70–99)
Potassium: 3.8 mEq/L (ref 3.5–5.1)
Total Protein: 7.2 g/dL (ref 6.0–8.3)

## 2012-05-25 LAB — CBC
HCT: 33.6 % — ABNORMAL LOW (ref 36.0–46.0)
Hemoglobin: 11.6 g/dL — ABNORMAL LOW (ref 12.0–15.0)
MCH: 30.4 pg (ref 26.0–34.0)
MCHC: 34.5 g/dL (ref 30.0–36.0)

## 2012-05-25 LAB — MRSA PCR SCREENING: MRSA by PCR: NEGATIVE

## 2012-05-25 MED ORDER — HYDROMORPHONE HCL PF 1 MG/ML IJ SOLN: 0.5000 mg | INTRAMUSCULAR | Status: AC | PRN

## 2012-05-25 MED ORDER — ALUM & MAG HYDROXIDE-SIMETH 200-200-20 MG/5ML PO SUSP: 30.0000 mL | Freq: Four times a day (QID) | ORAL | Status: DC | PRN

## 2012-05-25 MED ORDER — DILTIAZEM HCL 25 MG/5ML IV SOLN
10.0000 mg | Freq: Once | INTRAVENOUS | Status: AC
  Administered 2012-05-25: 10 mg via INTRAVENOUS

## 2012-05-25 MED ORDER — CARBOXYMETHYLCELLULOSE SODIUM 0.5 % OP SOLN: 1.0000 [drp] | Freq: Every day | OPHTHALMIC | Status: DC | PRN

## 2012-05-25 MED ORDER — ONDANSETRON HCL 4 MG/2ML IJ SOLN: 4.0000 mg | Freq: Three times a day (TID) | INTRAMUSCULAR | Status: AC | PRN

## 2012-05-25 MED ORDER — ONDANSETRON HCL 4 MG/2ML IJ SOLN: 4.0000 mg | Freq: Four times a day (QID) | INTRAMUSCULAR | Status: DC | PRN

## 2012-05-25 MED ORDER — ACETAMINOPHEN 650 MG RE SUPP: 650.0000 mg | Freq: Four times a day (QID) | RECTAL | Status: DC | PRN

## 2012-05-25 MED ORDER — FLUTICASONE PROPIONATE 50 MCG/ACT NA SUSP
2.0000 | Freq: Every day | NASAL | Status: DC | PRN
  Filled 2012-05-25: qty 16

## 2012-05-25 MED ORDER — MORPHINE SULFATE 2 MG/ML IJ SOLN
2.0000 mg | Freq: Once | INTRAMUSCULAR | Status: AC
  Administered 2012-05-25: 2 mg via INTRAMUSCULAR

## 2012-05-25 MED ORDER — INSULIN ASPART 100 UNIT/ML ~~LOC~~ SOLN: 0.0000 [IU] | Freq: Three times a day (TID) | SUBCUTANEOUS | Status: DC

## 2012-05-25 MED ORDER — ASPIRIN EC 81 MG PO TBEC
81.0000 mg | DELAYED_RELEASE_TABLET | Freq: Every day | ORAL | Status: DC
  Administered 2012-05-26 – 2012-05-29 (×4): 81 mg via ORAL
  Filled 2012-05-25 (×4): qty 1

## 2012-05-25 MED ORDER — BRIMONIDINE TARTRATE 0.2 % OP SOLN
1.0000 [drp] | Freq: Two times a day (BID) | OPHTHALMIC | Status: DC
  Administered 2012-05-25 – 2012-05-29 (×8): 1 [drp] via OPHTHALMIC
  Filled 2012-05-25: qty 5

## 2012-05-25 MED ORDER — DOCUSATE SODIUM 100 MG PO CAPS
100.0000 mg | ORAL_CAPSULE | Freq: Two times a day (BID) | ORAL | Status: DC
  Administered 2012-05-25 – 2012-05-29 (×7): 100 mg via ORAL
  Filled 2012-05-25 (×9): qty 1

## 2012-05-25 MED ORDER — ATORVASTATIN CALCIUM 20 MG PO TABS
20.0000 mg | ORAL_TABLET | Freq: Every day | ORAL | Status: DC
  Administered 2012-05-26 – 2012-05-29 (×4): 20 mg via ORAL
  Filled 2012-05-25 (×4): qty 1

## 2012-05-25 MED ORDER — OXYCODONE HCL 5 MG PO TABS
5.0000 mg | ORAL_TABLET | ORAL | Status: DC | PRN
  Administered 2012-05-25 – 2012-05-29 (×4): 5 mg via ORAL
  Filled 2012-05-25 (×4): qty 1

## 2012-05-25 MED ORDER — ACETAMINOPHEN 325 MG PO TABS: 650.0000 mg | ORAL_TABLET | Freq: Four times a day (QID) | ORAL | Status: DC | PRN

## 2012-05-25 MED ORDER — MORPHINE SULFATE 2 MG/ML IJ SOLN
2.0000 mg | Freq: Once | INTRAMUSCULAR | Status: DC
  Filled 2012-05-25: qty 1

## 2012-05-25 MED ORDER — BRINZOLAMIDE 1 % OP SUSP
1.0000 [drp] | Freq: Two times a day (BID) | OPHTHALMIC | Status: DC
  Administered 2012-05-25 – 2012-05-29 (×8): 1 [drp] via OPHTHALMIC
  Filled 2012-05-25: qty 10

## 2012-05-25 MED ORDER — DIPHENHYDRAMINE HCL 25 MG PO CAPS: 25.0000 mg | ORAL_CAPSULE | Freq: Every evening | ORAL | Status: DC | PRN

## 2012-05-25 MED ORDER — BIMATOPROST 0.01 % OP SOLN
1.0000 [drp] | Freq: Every day | OPHTHALMIC | Status: DC
  Administered 2012-05-25 – 2012-05-28 (×4): 1 [drp] via OPHTHALMIC
  Filled 2012-05-25: qty 2.5

## 2012-05-25 MED ORDER — HEPARIN BOLUS VIA INFUSION
2500.0000 [IU] | Freq: Once | INTRAVENOUS | Status: AC
  Administered 2012-05-25: 2500 [IU] via INTRAVENOUS

## 2012-05-25 MED ORDER — SODIUM CHLORIDE 0.9 % IV SOLN
INTRAVENOUS | Status: AC
  Administered 2012-05-25 (×2): via INTRAVENOUS

## 2012-05-25 MED ORDER — ONDANSETRON HCL 4 MG PO TABS: 4.0000 mg | ORAL_TABLET | Freq: Four times a day (QID) | ORAL | Status: DC | PRN

## 2012-05-25 MED ORDER — DILTIAZEM HCL 100 MG IV SOLR
5.0000 mg/h | Freq: Once | INTRAVENOUS | Status: AC
  Administered 2012-05-25: 5 mg/h via INTRAVENOUS

## 2012-05-25 MED ORDER — POLYETHYLENE GLYCOL 3350 17 G PO PACK
17.0000 g | PACK | Freq: Every day | ORAL | Status: DC | PRN
  Filled 2012-05-25: qty 1

## 2012-05-25 MED ORDER — MORPHINE SULFATE 2 MG/ML IJ SOLN
2.0000 mg | INTRAMUSCULAR | Status: DC | PRN
  Administered 2012-05-25: 2 mg via INTRAVENOUS
  Filled 2012-05-25: qty 1

## 2012-05-25 MED ORDER — ONDANSETRON HCL 4 MG/2ML IJ SOLN
4.0000 mg | Freq: Once | INTRAMUSCULAR | Status: AC
  Administered 2012-05-25: 4 mg via INTRAVENOUS
  Filled 2012-05-25: qty 2

## 2012-05-25 MED ORDER — SODIUM CHLORIDE 0.9 % IV SOLN: INTRAVENOUS | Status: AC

## 2012-05-25 MED ORDER — HEPARIN (PORCINE) IN NACL 100-0.45 UNIT/ML-% IJ SOLN
850.0000 [IU]/h | INTRAMUSCULAR | Status: DC
  Administered 2012-05-25: 700 [IU]/h via INTRAVENOUS
  Administered 2012-05-26 – 2012-05-28 (×2): 850 [IU]/h via INTRAVENOUS
  Filled 2012-05-25 (×4): qty 250

## 2012-05-25 MED ORDER — PILOCARPINE HCL 4 % OP SOLN
1.0000 [drp] | Freq: Four times a day (QID) | OPHTHALMIC | Status: DC
  Administered 2012-05-25 – 2012-05-29 (×17): 1 [drp] via OPHTHALMIC
  Filled 2012-05-25: qty 15

## 2012-05-25 MED ORDER — SODIUM CHLORIDE 0.9 % IV SOLN
INTRAVENOUS | Status: DC
  Administered 2012-05-25: 14:00:00 via INTRAVENOUS

## 2012-05-25 MED ORDER — BIMATOPROST 0.03 % OP SOLN
1.0000 [drp] | Freq: Every day | OPHTHALMIC | Status: DC
  Filled 2012-05-25: qty 2.5

## 2012-05-25 MED ORDER — POLYVINYL ALCOHOL 1.4 % OP SOLN
1.0000 [drp] | OPHTHALMIC | Status: DC | PRN
  Filled 2012-05-25: qty 15

## 2012-05-25 MED ORDER — ADULT MULTIVITAMIN W/MINERALS CH
1.0000 | ORAL_TABLET | Freq: Every day | ORAL | Status: DC
  Administered 2012-05-26 – 2012-05-29 (×4): 1 via ORAL
  Filled 2012-05-25 (×4): qty 1

## 2012-05-25 MED ORDER — GUANFACINE HCL 1 MG PO TABS
2.0000 mg | ORAL_TABLET | Freq: Every day | ORAL | Status: DC
  Administered 2012-05-25 – 2012-05-28 (×4): 2 mg via ORAL
  Filled 2012-05-25: qty 2
  Filled 2012-05-25: qty 1
  Filled 2012-05-25 (×3): qty 2

## 2012-05-25 MED ORDER — ZOLPIDEM TARTRATE 5 MG PO TABS
5.0000 mg | ORAL_TABLET | Freq: Every evening | ORAL | Status: DC | PRN
Start: 2012-05-25 — End: 2012-05-29

## 2012-05-25 MED ORDER — BRIMONIDINE TARTRATE 0.15 % OP SOLN
1.0000 [drp] | Freq: Two times a day (BID) | OPHTHALMIC | Status: DC
  Filled 2012-05-25: qty 5

## 2012-05-25 NOTE — ED Notes (Signed)
Pt.has a yellow arm band on high risk band

## 2012-05-25 NOTE — ED Notes (Signed)
Family at bedside. 

## 2012-05-25 NOTE — Consult Note (Signed)
CONSULT NOTE  Date: 05/25/2012               Patient Name:  Veronica Powers MRN: [id number]  DOB: [birth year] Age / Sex: 79 y.o., female        PCP: Laurena Slimmer Primary Cardiologist: Berton Mount, MD            Referring Physician: Algis Downs, PA-C              Reason for Consult: Atrial fib           History of Present Illness: Patient is a 79 y.o. female with a PMHx of chronic atrial fib, NSTEMI, AS,  HTN, failure to thrive who was admitted to [facility] on 05/25/2012 for evaluation of fracture of her left shoulder.   We were asked to see her to follow along with her cardiac issues.  She has chronic AF and is on coumadin.  She had a NSTEMI in March , 2014.  She has had a gradual and steady decline in her general function since that time.  She lives with her daughter.       Medications: Outpatient medications:  (Not in a hospital admission)  Current medications: Current Facility-Administered Medications  Medication Dose Route Frequency Provider Last Rate Last Dose  . 0.9 %  sodium chloride infusion   Intravenous Continuous Suzi Roots, MD 20 mL/hr at 05/25/12 1358    . 0.9 %  sodium chloride infusion   Intravenous STAT Suzi Roots, MD      . heparin ADULT infusion 100 units/mL (25000 units/250 mL)  700 Units/hr Intravenous Continuous Suzi Roots, MD      . heparin bolus via infusion 2,500 Units  2,500 Units Intravenous Once Suzi Roots, MD      . HYDROmorphone (DILAUDID) injection 0.5 mg  0.5 mg Intravenous Q4H PRN Suzi Roots, MD      . insulin aspart (novoLOG) injection 0-9 Units  0-9 Units Subcutaneous TID WC Marianne L York, PA-C      . ondansetron (ZOFRAN) injection 4 mg  4 mg Intravenous Q8H PRN Suzi Roots, MD       Current Outpatient Prescriptions  Medication Sig Dispense Refill  . allopurinol (ZYLOPRIM) 100 MG tablet Take 100 mg by mouth daily.       Marland Kitchen aspirin EC 81 MG EC tablet Take 1 tablet (81 mg total) by mouth daily.  30 tablet  3  .  atorvastatin (LIPITOR) 20 MG tablet Take 20 mg by mouth daily.        . bimatoprost (LUMIGAN) 0.03 % ophthalmic solution Place 1 drop into both eyes at bedtime.        . brimonidine (ALPHAGAN) 0.15 % ophthalmic solution Place 1 drop into both eyes 2 (two) times daily.        . brinzolamide (AZOPT) 1 % ophthalmic suspension Place 1 drop into both eyes 2 (two) times daily.        . carboxymethylcellulose (REFRESH PLUS) 0.5 % SOLN Place 1 drop into both eyes as needed.        . COLCRYS 0.6 MG tablet Take 0.6 mg by mouth 2 (two) times daily. prn      . docusate sodium 100 MG CAPS Take 100 mg by mouth 2 (two) times daily.  60 capsule  0  . fluticasone (FLONASE) 50 MCG/ACT nasal spray Place 2 sprays into the nose daily as needed for allergies.       Marland Kitchen  guaiFENesin (MUCINEX) 600 MG 12 hr tablet Take 1,200 mg by mouth daily as needed for congestion.      Marland Kitchen guanFACINE (TENEX) 2 MG tablet Take 2 mg by mouth at bedtime.      . hydrALAZINE (APRESOLINE) 10 MG tablet Take 2 tablets (20 mg total) by mouth 2 (two) times daily.  120 tablet  6  . Multiple Vitamin (MULTIVITAMIN WITH MINERALS) TABS Take 1 tablet by mouth daily.      Marland Kitchen oxybutynin (DITROPAN-XL) 10 MG 24 hr tablet Take 10 mg by mouth daily as needed. For bladder control      . pilocarpine (PILOCAR) 4 % ophthalmic solution Place 1 drop into both eyes 4 (four) times daily.        . pindolol (VISKEN) 10 MG tablet Take 1 tablet (10 mg total) by mouth 2 (two) times daily.  60 tablet  5  . polyethylene glycol (MIRALAX / GLYCOLAX) packet Take 17 g by mouth daily as needed (for constipation).  14 each  0  . warfarin (COUMADIN) 5 MG tablet Take 5 mg by mouth daily.          Allergies  Allergen Reactions  . Nifedipine     REACTION: made her weak     Past Medical History  Diagnosis Date  . Atrial fibrillation     a. permanent - on coumadin.  . Chronic systolic CHF (congestive heart failure)     a. 02/20/2012 Echo: EF 40-50%, diff HK, mod to sev AS, mild AI  (valve area 1.09cm2 Vmax), mild MR, mod to sev dil LA, mildl dil RV.  Marland Kitchen Aortic stenosis, moderate     a. 02/2012 Echo:  valve area 1.09cm2 Vmax  . Diabetes     type 2  . Stroke     left sided residual  . Hypertension   . Gout   . GERD (gastroesophageal reflux disease)   . Bradycardia     a. noted to limit tx of afib - concern for possible ppm need.    Past Surgical History  Procedure Laterality Date  . Cholecystectomy    . Cesarean section      Family History  Problem Relation Age of Onset  . Coronary artery disease    . Cancer    . Coronary artery disease Mother   . Cancer Father     Social History:  reports that she has never smoked. She has never used smokeless tobacco. She reports that she does not drink alcohol or use illicit drugs.   Review of Systems: Constitutional:  denies fever, chills, diaphoresis, appetite change and fatigue.  HEENT: denies photophobia, eye pain, redness, hearing loss, ear pain, congestion, sore throat, rhinorrhea, sneezing, neck pain, neck stiffness and tinnitus.  Respiratory: denies SOB, DOE, cough, chest tightness, and wheezing.  Cardiovascular: denies chest pain, palpitations and leg swelling.  Gastrointestinal: denies nausea, vomiting, abdominal pain, diarrhea, constipation, blood in stool.  Genitourinary: denies dysuria, urgency, frequency, hematuria, flank pain and difficulty urinating.  Musculoskeletal: denies  myalgias, back pain, joint swelling, arthralgias and gait problem.   Skin: denies pallor, rash and wound.  Neurological: denies dizziness, seizures, syncope, weakness, light-headedness, numbness and headaches.   Hematological: denies adenopathy, easy bruising, personal or family bleeding history.  Psychiatric/ Behavioral: denies suicidal ideation, mood changes, confusion, nervousness, sleep disturbance and agitation.    Physical Exam: BP 149/76  Pulse 77  Temp(Src) 98.1 F (36.7 C) (Oral)  Resp 25  Ht 4' 11.06" (1.5 m)   Wt  123 lb 10.9 oz (56.1 kg)  BMI 24.93 kg/m2  SpO2 99%  General: Vital signs reviewed and noted. Elderly, frail black female  Head: Normocephalic, atraumatic, sclera anicteric, mucus membranes are moist  Neck: Supple. Negative for carotid bruits. JVD not elevated.  Lungs:  Clear bilaterally to auscultation without wheezes, rales, or rhonchi. Breathing is unlabored.  Heart: Irreg. Irreg.  2/6 systolic murmur  Abdomen:  Soft, non-tender, non-distended with normoactive bowel sounds. No hepatomegaly. No rebound/guarding. No obvious abdominal masses  MSK: Strength and the appear normal for age.  Extremities: No clubbing or cyanosis. No edema.  Tender left shoulder.  Neurologic: Alert and oriented X 3. Moves all extremities spontaneously.  Psych: Responds to questions appropriately with a normal affect.    Lab results: Basic Metabolic Panel:  Recent Labs Lab 05/25/12 1316  NA 140  K 3.8  CL 104  CO2 24  GLUCOSE 141*  BUN 25*  CREATININE 1.51*  CALCIUM 9.0    Liver Function Tests:  Recent Labs Lab 05/25/12 1316  AST 23  ALT 33  ALKPHOS 71  BILITOT 1.3*  PROT 7.2  ALBUMIN 3.4*   No results found for this basename: LIPASE, AMYLASE,  in the last 168 hours No results found for this basename: AMMONIA,  in the last 168 hours  CBC:  Recent Labs Lab 05/25/12 1316  WBC 7.0  HGB 11.6*  HCT 33.6*  MCV 88.2  PLT 254    Cardiac Enzymes: No results found for this basename: CKTOTAL, CKMB, CKMBINDEX, TROPONINI,  in the last 168 hours  BNP: No components found with this basename: POCBNP,   CBG: No results found for this basename: GLUCAP,  in the last 168 hours  Coagulation Studies:  Recent Labs  05/25/12 1316  LABPROT 20.3*  INR 1.81*     Other results:  ECG:  Chronic a-fib. Rate is controlled.   Imaging: Dg Shoulder Left  05/25/2012  *RADIOLOGY REPORT*  Clinical Data: Fall  LEFT SHOULDER - 2+ VIEW  Comparison: None.  Findings: Displaced fracture of the  humeral neck with lateral displacement.  No other fracture.  Glenohumeral joint is normal.  Mild AC separation on the left which could be chronic.  IMPRESSION: Displaced fracture of left humeral neck.   Original Report Authenticated By: Janeece Riggers, M.D.    Dg Humerus Left  05/25/2012  *RADIOLOGY REPORT*  Clinical Data: Fall  LEFT HUMERUS - 2+ VIEW  Comparison: None.  Findings: Transverse fracture through the humeral neck with displacement laterally.  No other fracture of the humerus.  IMPRESSION: Displaced fracture of the neck of the humerus.   Original Report Authenticated By: Janeece Riggers, M.D.         Assessment & Plan: 1. Atrial fib- this is a chronic issue.  Her rate was slightly fast when she arrived in the ER - likely due to pain.  I would not change any of her medications at this point.  It does not sound like she had syncope - just lost her balance getting on the commode.   If she remains stable overnight, we will likely sign off tomorrow.   3. Aortic stenosis:  Stable.    DVT PPX -    Alvia Grove., MD, [facility] 05/25/2012, 4:11 PM

## 2012-05-25 NOTE — ED Provider Notes (Addendum)
History     CSN: [id number]  Arrival date & time 05/25/12  1111   First MD Initiated Contact with Patient 05/25/12 1139      Chief Complaint  Patient presents with  . Fall    (Consider location/radiation/quality/duration/timing/severity/associated sxs/prior treatment) Patient is a 79 y.o. female presenting with fall. The history is provided by the patient and a relative.  Fall Pertinent negatives include no fever, no numbness, no abdominal pain, no nausea, no vomiting and no headaches.  pt w hx afib, dm, prior cva, was getting off bedside commode, tried to walk without her cane/walker, and tripped/stumbled, falling forward. Landed on left arm. C/o left shoulder pain. Constant, mod-severe, dull, worse w movement shoulder. Denies prior injury. No associated numbness/weakness. Denies faintness or dizziness prior to fall. Denies head injury, contusion or headache. No neck or back pain. Denies other pain or injury. States recent health has been at baseline.     Past Medical History  Diagnosis Date  . Atrial fibrillation     permanent  . Atrial fibrillation with controlled ventricular response   . Aortic stenosis, moderate   . Diabetes     type 2  . Stroke     left sided residual  . CHF (congestive heart failure)   . Hypertension   . Gout   . Anginal pain   . Shortness of breath   . GERD (gastroesophageal reflux disease)     Past Surgical History  Procedure Laterality Date  . Cholecystectomy    . Cesarean section      Family History  Problem Relation Age of Onset  . Coronary artery disease    . Cancer    . Coronary artery disease Mother   . Cancer Father     History  Substance Use Topics  . Smoking status: Never Smoker   . Smokeless tobacco: Never Used  . Alcohol Use: No    OB History   Grav Para Term Preterm Abortions TAB SAB Ect Mult Living                  Review of Systems  Constitutional: Negative for fever and chills.  HENT: Negative for neck pain.    Eyes: Negative for redness and visual disturbance.  Respiratory: Negative for cough and shortness of breath.   Cardiovascular: Negative for chest pain.  Gastrointestinal: Negative for nausea, vomiting and abdominal pain.  Genitourinary: Negative for flank pain.  Musculoskeletal: Negative for back pain.  Skin: Negative for rash.  Neurological: Negative for syncope, weakness, light-headedness, numbness and headaches.  Hematological: Does not bruise/bleed easily.  Psychiatric/Behavioral: Negative for confusion.    Allergies  Nifedipine  Home Medications   Current Outpatient Rx  Name  Route  Sig  Dispense  Refill  . allopurinol (ZYLOPRIM) 100 MG tablet   Oral   Take 100 mg by mouth daily.          Marland Kitchen aspirin EC 81 MG EC tablet   Oral   Take 1 tablet (81 mg total) by mouth daily.   30 tablet   3   . atorvastatin (LIPITOR) 20 MG tablet   Oral   Take 20 mg by mouth daily.           . bimatoprost (LUMIGAN) 0.03 % ophthalmic solution   Both Eyes   Place 1 drop into both eyes at bedtime.           . brimonidine (ALPHAGAN) 0.15 % ophthalmic solution   Both Eyes  Place 1 drop into both eyes 2 (two) times daily.           . brinzolamide (AZOPT) 1 % ophthalmic suspension   Both Eyes   Place 1 drop into both eyes 2 (two) times daily.           . carboxymethylcellulose (REFRESH PLUS) 0.5 % SOLN   Both Eyes   Place 1 drop into both eyes as needed.           . COLCRYS 0.6 MG tablet   Oral   Take 0.6 mg by mouth 2 (two) times daily. prn         . docusate sodium 100 MG CAPS   Oral   Take 100 mg by mouth 2 (two) times daily.   60 capsule   0   . fluticasone (FLONASE) 50 MCG/ACT nasal spray   Nasal   Place 2 sprays into the nose daily as needed for allergies.          Marland Kitchen guaiFENesin (MUCINEX) 600 MG 12 hr tablet   Oral   Take 1,200 mg by mouth daily as needed for congestion.         Marland Kitchen guanFACINE (TENEX) 2 MG tablet   Oral   Take 2 mg by mouth at  bedtime.         . hydrALAZINE (APRESOLINE) 10 MG tablet   Oral   Take 2 tablets (20 mg total) by mouth 2 (two) times daily.   120 tablet   6   . Multiple Vitamin (MULTIVITAMIN WITH MINERALS) TABS   Oral   Take 1 tablet by mouth daily.         Marland Kitchen oxybutynin (DITROPAN-XL) 10 MG 24 hr tablet   Oral   Take 10 mg by mouth daily as needed. For bladder control         . pilocarpine (PILOCAR) 4 % ophthalmic solution   Both Eyes   Place 1 drop into both eyes 4 (four) times daily.           . pindolol (VISKEN) 10 MG tablet   Oral   Take 1 tablet (10 mg total) by mouth 2 (two) times daily.   60 tablet   5   . polyethylene glycol (MIRALAX / GLYCOLAX) packet   Oral   Take 17 g by mouth daily as needed (for constipation).   14 each   0   . warfarin (COUMADIN) 5 MG tablet   Oral   Take 5 mg by mouth every evening. Take 1 tablet on Mon, Wed, and Fri.  Take 0.5 tablet the rest of the week           BP 107/75  Pulse 118  Temp(Src) 97.3 F (36.3 C) (Oral)  Resp 16  SpO2 100%  Physical Exam  Nursing note and vitals reviewed. Constitutional: She appears well-developed and well-nourished. No distress.  HENT:  Head: Atraumatic.  Nose: Nose normal.  Mouth/Throat: Oropharynx is clear and moist.  No facial or scalp sts, contusion or bruising.   Eyes: Conjunctivae are normal. Pupils are equal, round, and reactive to light. No scleral icterus.  Neck: Normal range of motion. Neck supple. No tracheal deviation present.  Cardiovascular: Normal heart sounds and intact distal pulses.   Pulmonary/Chest: Effort normal and breath sounds normal. No respiratory distress. She exhibits no tenderness.  Abdominal: Soft. Normal appearance and bowel sounds are normal. She exhibits no distension. There is no tenderness.  Musculoskeletal: She exhibits tenderness. She  exhibits no edema.  Tenderness left shoulder, proximal humerus. Skin intact. Limited rom at shoulder due to pain, otherwise good  rom bil ext. Distal pulses palp. CTLS spine, non tender, aligned, no step off.   Neurological: She is alert.  Alert, oriented. Speech clear. Pt w left sided/left arm weakness at baseline from prior cva, w very little/no use of left arm at baseline - no new neuro deficit on exam.   Skin: Skin is warm and dry. No rash noted.  Psychiatric: She has a normal mood and affect.    ED Course  Procedures (including critical care time)  Results for orders placed during the hospital encounter of 05/25/12  PROTIME-INR      Result Value Range   Prothrombin Time 20.3 (*) 11.6 - 15.2 seconds   INR 1.81 (*) 0.00 - 1.49  URINALYSIS, ROUTINE W REFLEX MICROSCOPIC      Result Value Range   Color, Urine YELLOW  YELLOW   APPearance CLEAR  CLEAR   Specific Gravity, Urine 1.022  1.005 - 1.030   pH 5.0  5.0 - 8.0   Glucose, UA NEGATIVE  NEGATIVE mg/dL   Hgb urine dipstick NEGATIVE  NEGATIVE   Bilirubin Urine SMALL (*) NEGATIVE   Ketones, ur 15 (*) NEGATIVE mg/dL   Protein, ur >147 (*) NEGATIVE mg/dL   Urobilinogen, UA 0.2  0.0 - 1.0 mg/dL   Nitrite NEGATIVE  NEGATIVE   Leukocytes, UA TRACE (*) NEGATIVE  CBC      Result Value Range   WBC 7.0  4.0 - 10.5 K/uL   RBC 3.81 (*) 3.87 - 5.11 MIL/uL   Hemoglobin 11.6 (*) 12.0 - 15.0 g/dL   HCT 82.9 (*) 56.2 - 13.0 %   MCV 88.2  78.0 - 100.0 fL   MCH 30.4  26.0 - 34.0 pg   MCHC 34.5  30.0 - 36.0 g/dL   RDW 86.5 (*) 78.4 - 69.6 %   Platelets 254  150 - 400 K/uL  COMPREHENSIVE METABOLIC PANEL      Result Value Range   Sodium 140  135 - 145 mEq/L   Potassium 3.8  3.5 - 5.1 mEq/L   Chloride 104  96 - 112 mEq/L   CO2 24  19 - 32 mEq/L   Glucose, Bld 141 (*) 70 - 99 mg/dL   BUN 25 (*) 6 - 23 mg/dL   Creatinine, Ser 2.95 (*) 0.50 - 1.10 mg/dL   Calcium 9.0  8.4 - 28.4 mg/dL   Total Protein 7.2  6.0 - 8.3 g/dL   Albumin 3.4 (*) 3.5 - 5.2 g/dL   AST 23  0 - 37 U/L   ALT 33  0 - 35 U/L   Alkaline Phosphatase 71  39 - 117 U/L   Total Bilirubin 1.3 (*) 0.3  - 1.2 mg/dL   GFR calc non Af Amer 31 (*) >90 mL/min   GFR calc Af Amer 35 (*) >90 mL/min  URINE MICROSCOPIC-ADD ON      Result Value Range   Squamous Epithelial / LPF RARE  RARE   WBC, UA 0-2  <3 WBC/hpf   Bacteria, UA FEW (*) RARE   Casts HYALINE CASTS (*) NEGATIVE   Dg Shoulder Left  05/25/2012  *RADIOLOGY REPORT*  Clinical Data: Fall  LEFT SHOULDER - 2+ VIEW  Comparison: None.  Findings: Displaced fracture of the humeral neck with lateral displacement.  No other fracture.  Glenohumeral joint is normal.  Mild AC separation on the left which  could be chronic.  IMPRESSION: Displaced fracture of left humeral neck.   Original Report Authenticated By: Janeece Riggers, M.D.    Dg Humerus Left  05/25/2012  *RADIOLOGY REPORT*  Clinical Data: Fall  LEFT HUMERUS - 2+ VIEW  Comparison: None.  Findings: Transverse fracture through the humeral neck with displacement laterally.  No other fracture of the humerus.  IMPRESSION: Displaced fracture of the neck of the humerus.   Original Report Authenticated By: Janeece Riggers, M.D.        MDM  Iv ns. Morphine iv. zofran iv. Labs. Jay Schlichter.  Reviewed nursing notes and prior charts for additional history.    Date: 05/25/2012  Rate: 110  Rhythm: atrial fibrillation  QRS Axis: left  Intervals: normal  ST/T Wave abnormalities: nonspecific ST/T changes  Conduction Disutrbances:afib  Narrative Interpretation:   Old EKG Reviewed: unchanged x rate  Recheck hr 130's, afib w rvr.  cardizem bolus and gtt.   Reviewed xrays and labs.   Shoulder immobilizer/sling applied.  Pain improved. Radial pulse 2+.   Given afib rvr, fall w humerus fx, renal insuff/AKI new as compared to prior labs, will admit to med service.   [facility]-  Indicates temp order to stepdown, requests ortho consult re proximal humerus fx.  Discussed pt with Dorene Grebe, ortho on call, he will see pt/leave consult note.        Suzi Roots, MD 05/25/12 1504

## 2012-05-25 NOTE — Progress Notes (Signed)
ANTICOAGULATION CONSULT NOTE - Initial Consult  Pharmacy Consult for UFH Indication: atrial fibrillation  Allergies  Allergen Reactions  . Nifedipine     REACTION: made her weak    Patient Measurements: Height: 4' 11.06" (150 cm) Weight: 123 lb 10.9 oz (56.1 kg) IBW/kg (Calculated) : 43.33 Heparin Dosing Weight: 56kg  Vital Signs: Temp: 97.3 F (36.3 C) (05/07 1122) Temp src: Oral (05/07 1122) BP: 154/113 mmHg (05/07 1400) Pulse Rate: 115 (05/07 1400)  Labs:  Recent Labs  05/25/12 1316  HGB 11.6*  HCT 33.6*  PLT 254  LABPROT 20.3*  INR 1.81*  CREATININE 1.51*    Estimated Creatinine Clearance: 21.2 ml/min (by C-G formula based on Cr of 1.51).   Medical History: Past Medical History  Diagnosis Date  . Atrial fibrillation     permanent  . Atrial fibrillation with controlled ventricular response   . Aortic stenosis, moderate   . Diabetes     type 2  . Stroke     left sided residual  . CHF (congestive heart failure)   . Hypertension   . Gout   . Anginal pain   . Shortness of breath   . GERD (gastroesophageal reflux disease)     Medications:   (Not in a hospital admission)  Assessment: 79 y/o female patient admitted s/p fall with proximal humerus fx, on chronic coumadin for h/o afib. INR subtherapeutic, plan to bridge with heparin pending decision for surgery.   Goal of Therapy:  Heparin level 0.3-0.7 units/ml Monitor platelets by anticoagulation protocol: Yes   Plan:  Heparin 2500 unit IV bolus followed by infusion at 700 units/hr. Check 8 hour heparin level with daily cbc and heparin level.  Verlene Mayer, PharmD, BCPS Pager [phone] 05/25/2012,3:46 PM

## 2012-05-25 NOTE — ED Notes (Signed)
Was getting on commode and fell  And now left arm hurts  Pt is paralized on left side and already has some swelling

## 2012-05-25 NOTE — ED Notes (Signed)
Displaced fracture of the neck of the humerus.

## 2012-05-25 NOTE — H&P (Signed)
[facility] History and Physical  Khyleigh Furney AVW:[id number] DOB: Nov 24, 1925 DOA: 05/25/2012  Referring physician: Dr. Denton Lank PCP: Laurena Slimmer, MD  Specialists: Dr. August Saucer  Chief Complaint: Shoulder Pain  HPI: Veronica Powers is a 79 y.o. female with history of recent MI, A-fib, CVA, CHF, HTN, DM type 2.  Presents for left shoulder pain secondary to fall while trying to stand after using the rest room last night. Brought to ED by daughter after pain was uncontrollable at home.  History obtained from [name] her daughter and primary care taker. She was in good health, recently released from rehab. She has no history of falling or recent illness. No LOC at time of injury. No indication of head trauma. In ED patient was found to be in A-fib with RVR, denies chest pain, palpitations and SOB. X-ray indicated displaced closed left humeral fracture. Dr. August Saucer has be consulted.  Review of Systems: Pt denies head ache, vomiting, abdominal pain, changes in bowel habits, dysuria, cuts, scraps and bruises. All of systems were found to be negative.   Past Medical History  Diagnosis Date  . Atrial fibrillation     permanent  . Chronic systolic CHF (congestive heart failure)     a. 02/20/2012 Echo: EF 40-50%, diff HK, mod to sev AS, mild AI (valve area 1.09cm2 Vmax), mild MR, mod to sev dil LA, mildl dil RV.  Marland Kitchen Aortic stenosis, moderate     a. 02/2012 Echo:  valve area 1.09cm2 Vmax  . Diabetes     type 2  . Stroke     left sided residual  . Hypertension   . Gout   . GERD (gastroesophageal reflux disease)    Past Surgical History  Procedure Laterality Date  . Cholecystectomy    . Cesarean section     Social History:  reports that she has never smoked. She has never used smokeless tobacco. She reports that she does not drink alcohol or use illicit drugs. Patient lives at home with daughter who assist in ADLs.   Allergies  Allergen Reactions  . Nifedipine     REACTION: made her weak     Family History  Problem Relation Age of Onset  . Coronary artery disease    . Cancer    . Coronary artery disease Mother   . Cancer Father     Prior to Admission medications   Medication Sig Start Date End Date Taking? Authorizing Provider  allopurinol (ZYLOPRIM) 100 MG tablet Take 100 mg by mouth daily.    Yes Historical Provider, MD  aspirin EC 81 MG EC tablet Take 1 tablet (81 mg total) by mouth daily. 04/03/12  Yes Osvaldo Shipper, MD  atorvastatin (LIPITOR) 20 MG tablet Take 20 mg by mouth daily.     Yes Historical Provider, MD  bimatoprost (LUMIGAN) 0.03 % ophthalmic solution Place 1 drop into both eyes at bedtime.     Yes Historical Provider, MD  brimonidine (ALPHAGAN) 0.15 % ophthalmic solution Place 1 drop into both eyes 2 (two) times daily.     Yes Historical Provider, MD  brinzolamide (AZOPT) 1 % ophthalmic suspension Place 1 drop into both eyes 2 (two) times daily.     Yes Historical Provider, MD  carboxymethylcellulose (REFRESH PLUS) 0.5 % SOLN Place 1 drop into both eyes as needed.     Yes Historical Provider, MD  COLCRYS 0.6 MG tablet Take 0.6 mg by mouth 2 (two) times daily. prn 09/14/11  Yes Historical Provider, MD  docusate sodium 100  MG CAPS Take 100 mg by mouth 2 (two) times daily. 04/03/12  Yes Osvaldo Shipper, MD  fluticasone (FLONASE) 50 MCG/ACT nasal spray Place 2 sprays into the nose daily as needed for allergies.  09/02/11  Yes Historical Provider, MD  guaiFENesin (MUCINEX) 600 MG 12 hr tablet Take 1,200 mg by mouth daily as needed for congestion.   Yes Historical Provider, MD  guanFACINE (TENEX) 2 MG tablet Take 2 mg by mouth at bedtime.   Yes Historical Provider, MD  hydrALAZINE (APRESOLINE) 10 MG tablet Take 2 tablets (20 mg total) by mouth 2 (two) times daily. 04/28/12  Yes Duke Salvia, MD  Multiple Vitamin (MULTIVITAMIN WITH MINERALS) TABS Take 1 tablet by mouth daily.   Yes Historical Provider, MD  oxybutynin (DITROPAN-XL) 10 MG 24 hr tablet Take 10 mg by  mouth daily as needed. For bladder control   Yes Historical Provider, MD  pilocarpine (PILOCAR) 4 % ophthalmic solution Place 1 drop into both eyes 4 (four) times daily.     Yes Historical Provider, MD  pindolol (VISKEN) 10 MG tablet Take 1 tablet (10 mg total) by mouth 2 (two) times daily. 04/07/12  Yes Duke Salvia, MD  polyethylene glycol [facility] Ethelene Hal) packet Take 17 g by mouth daily as needed (for constipation). 04/03/12  Yes Osvaldo Shipper, MD  warfarin (COUMADIN) 5 MG tablet Take 5 mg by mouth daily.    Yes Historical Provider, MD   Physical Exam: Filed Vitals:   05/25/12 1200 05/25/12 1330 05/25/12 1335 05/25/12 1400  BP: 121/93 124/79 124/79 154/113  Pulse: 114 104 110 115  Temp:      TempSrc:      Resp: 29 30 37 14  Height:   4' 11.06" (1.5 m)   Weight:   56.1 kg (123 lb 10.9 oz)   SpO2: 100% 100% 100% 96%     General:  WDWN Awake and alert, in mild distress.  Eyes: Pupil are equal   ENT: Oral pharynx is pink without exudates  Neck: supple without lymphadenopathy or obvious JVD  Cardiovascular: Irregularly irregular rhythm without MGR  Respiratory: CTA B, no accessory muscle use  Abdomen: soft NT, ND, with Bs+ without masses  Skin: without rashes or lesions. Small bruises on proximal left arm.  Musculoskeletal: Painful decrease strength and ROM of left arm.   Psychiatric: Awake, alert, cooperative, and well groomed  Neurologic: CN II-XII intake, non-focal   Labs on Admission:  Basic Metabolic Panel:  Recent Labs Lab 05/25/12 1316  NA 140  K 3.8  CL 104  CO2 24  GLUCOSE 141*  BUN 25*  CREATININE 1.51*  CALCIUM 9.0   Liver Function Tests:  Recent Labs Lab 05/25/12 1316  AST 23  ALT 33  ALKPHOS 71  BILITOT 1.3*  PROT 7.2  ALBUMIN 3.4*   No results found for this basename: LIPASE, AMYLASE,  in the last 168 hours No results found for this basename: AMMONIA,  in the last 168 hours CBC:  Recent Labs Lab 05/25/12 1316  WBC 7.0  HGB  11.6*  HCT 33.6*  MCV 88.2  PLT 254   Cardiac Enzymes: No results found for this basename: CKTOTAL, CKMB, CKMBINDEX, TROPONINI,  in the last 168 hours  BNP (last 3 results)  Recent Labs  02/19/12 1050  PROBNP 1951.0*   CBG: No results found for this basename: GLUCAP,  in the last 168 hours  Radiological Exams on Admission: Dg Shoulder Left  05/25/2012  *RADIOLOGY REPORT*  Clinical  Data: Fall  LEFT SHOULDER - 2+ VIEW  Comparison: None.  Findings: Displaced fracture of the humeral neck with lateral displacement.  No other fracture.  Glenohumeral joint is normal.  Mild AC separation on the left which could be chronic.  IMPRESSION: Displaced fracture of left humeral neck.   Original Report Authenticated By: Janeece Riggers, M.D.    Dg Humerus Left  05/25/2012  *RADIOLOGY REPORT*  Clinical Data: Fall  LEFT HUMERUS - 2+ VIEW  Comparison: None.  Findings: Transverse fracture through the humeral neck with displacement laterally.  No other fracture of the humerus.  IMPRESSION: Displaced fracture of the neck of the humerus.   Original Report Authenticated By: Janeece Riggers, M.D.     EKG: Pending  Assessment/Plan Principal Problem:   Left humeral fracture Active Problems:   Atrial fibrillation   Chronic anticoagulation   Acute on chronic diastolic CHF (congestive heart failure)   Diabetes   Displaced Left humeral fracture - Management per orthopedics, Dr. August Saucer consulted -Pain controled with tylenol, oxy, and morphine prn -Coumadin held for potential procedure  A-fib with RVR -diltazem drip started in ED -admit to step down [facility] cardiology consulted -IV heparin per pharmacy  CHF - Last Echo 2/14 LVEF of 40-50% with diffuse hypokinesis  - Cont pindolol and lipitor  -[place] cardiology consulted  Acute on chronic renal failure -IV fluids at 129ml/hr for 24hr  -hold colcrys, oxybutynin and allopurinol -A.M BMP  DM2 -check HBA1C -sliding scale sensitive -carb modified  diet  HTN -hold hydralizine  -Cont pindolol   Code Status: DNR Family Communication:Daughter [name] at bedside Disposition Plan: inpatient likely discharge to SNF Time spent:  Allie Kelly PAS Stephani [employer] [facility] Pager [phone]   If 7PM-7AM, please contact night-coverage [email or web address] Password [facility] 05/25/2012, 3:50 PM    Attending Patient seen and examined, agree with the assessment and plan. Left humeral fracture following what sounds like a mechanical fall-likely in Afib with RVR due to pain. Await ortho and cards eval. Monitor in SDU-and titrate off cardizem gtt.  S Lehi Phifer

## 2012-05-25 NOTE — ED Notes (Addendum)
Cardizem stopped due to HR dropped to 54. Dr. Denton Lank notified and floor Paulino Rily.

## 2012-05-26 DIAGNOSIS — S42309D Unspecified fracture of shaft of humerus, unspecified arm, subsequent encounter for fracture with routine healing: Secondary | ICD-10-CM

## 2012-05-26 DIAGNOSIS — Y92009 Unspecified place in unspecified non-institutional (private) residence as the place of occurrence of the external cause: Secondary | ICD-10-CM

## 2012-05-26 DIAGNOSIS — W19XXXA Unspecified fall, initial encounter: Secondary | ICD-10-CM

## 2012-05-26 DIAGNOSIS — E860 Dehydration: Secondary | ICD-10-CM | POA: Diagnosis present

## 2012-05-26 DIAGNOSIS — R7989 Other specified abnormal findings of blood chemistry: Secondary | ICD-10-CM | POA: Diagnosis present

## 2012-05-26 LAB — CBC
HCT: 33.9 % — ABNORMAL LOW (ref 36.0–46.0)
MCH: 30.7 pg (ref 26.0–34.0)
MCHC: 34.8 g/dL (ref 30.0–36.0)
Platelets: 212 10*3/uL (ref 150–400)
RBC: 3.42 MIL/uL — ABNORMAL LOW (ref 3.87–5.11)
RDW: 17.6 % — ABNORMAL HIGH (ref 11.5–15.5)
WBC: 5.6 10*3/uL (ref 4.0–10.5)

## 2012-05-26 LAB — HEMOGLOBIN A1C: Hgb A1c MFr Bld: 5.7 % — ABNORMAL HIGH (ref <5.7)

## 2012-05-26 LAB — BASIC METABOLIC PANEL
CO2: 18 mEq/L — ABNORMAL LOW (ref 19–32)
Calcium: 7.9 mg/dL — ABNORMAL LOW (ref 8.4–10.5)
GFR calc non Af Amer: 33 mL/min — ABNORMAL LOW (ref >90)
Sodium: 140 mEq/L (ref 135–145)

## 2012-05-26 LAB — GLUCOSE, CAPILLARY
Glucose-Capillary: 101 mg/dL — ABNORMAL HIGH (ref 70–99)
Glucose-Capillary: 61 mg/dL — ABNORMAL LOW (ref 70–99)
Glucose-Capillary: 94 mg/dL (ref 70–99)

## 2012-05-26 LAB — HEPARIN LEVEL (UNFRACTIONATED): Heparin Unfractionated: 0.19 IU/mL — ABNORMAL LOW (ref 0.30–0.70)

## 2012-05-26 MED ORDER — SODIUM CHLORIDE 0.9 % IV BOLUS (SEPSIS)
250.0000 mL | Freq: Once | INTRAVENOUS | Status: AC
  Administered 2012-05-26: 250 mL via INTRAVENOUS

## 2012-05-26 MED ORDER — GLUCERNA 1.2 CAL PO LIQD
1000.0000 mL | ORAL | Status: DC
  Filled 2012-05-26 (×2): qty 1000

## 2012-05-26 MED ORDER — SODIUM CHLORIDE 0.9 % IV BOLUS (SEPSIS)
500.0000 mL | Freq: Once | INTRAVENOUS | Status: AC
  Administered 2012-05-26: 500 mL via INTRAVENOUS

## 2012-05-26 MED ORDER — GLUCERNA SHAKE PO LIQD
237.0000 mL | ORAL | Status: DC
  Administered 2012-05-26 – 2012-05-29 (×4): 237 mL via ORAL

## 2012-05-26 NOTE — Evaluation (Signed)
Occupational Therapy Evaluation Patient Details Name: Veronica Powers MRN: [id number] DOB: May 28, 1925 Today's Date: 05/26/2012 Time: 1914-7829 OT Time Calculation (min): 24 min  OT Assessment / Plan / Recommendation Clinical Impression   This 79 y.o. Female with h/o of Lt. Hemiplegia due to old CVA, admitted after falling at home and sustaining Lt. Humeral fracture.  Pt utilizes a w/c as primary mode of mobilty at home, but reports she was ambulating to Bathroom with assist from family and she was assisting minimally with BADLs.   Anticipate pt is approaching baseline level of functioning from a functional mobility standpoint.  She will benefit from continued OT to instruct family on safety with BADLs, and improve pt's ability to assist safely with ADLs.     OT Assessment  Patient needs continued OT Services    Follow Up Recommendations  Home health OT;Supervision/Assistance - 24 hour    Barriers to Discharge None    Equipment Recommendations  3 in 1 bedside comode    Recommendations for Other Services    Frequency  Min 2X/week    Precautions / Restrictions Precautions Precautions: Fall Required Braces or Orthoses:  (sling on L shoulder) Restrictions Weight Bearing Restrictions: No       ADL  Eating/Feeding: Minimal assistance Where Assessed - Eating/Feeding: Bed level Grooming: Wash/dry hands;Wash/dry face;Teeth care;Set up Where Assessed - Grooming: Supine, head of bed up Upper Body Bathing: Maximal assistance Where Assessed - Upper Body Bathing: Supported sitting Lower Body Bathing: Maximal assistance Where Assessed - Lower Body Bathing: Supported sit to stand Upper Body Dressing: +1 Total assistance Where Assessed - Upper Body Dressing: Unsupported sitting Lower Body Dressing: +1 Total assistance Where Assessed - Lower Body Dressing: Supported sit to Pharmacist, hospital: Moderate assistance Toilet Transfer Method: Sit to stand Toilet Transfer Equipment: Comfort height  toilet Toileting - Clothing Manipulation and Hygiene: +1 Total assistance Where Assessed - Toileting Clothing Manipulation and Hygiene: Standing Transfers/Ambulation Related to ADLs: Sit to stand with mod A ADL Comments: Pt moved to EOB with mod A.  PROM performed elbow flex/ext; forearm flex/ext; wrist flex/ext and digit flex/ext 10 reps each    OT Diagnosis: Generalized weakness;Acute pain;Hemiplegia non-dominant side  OT Problem List: Decreased strength;Decreased range of motion;Decreased activity tolerance;Impaired balance (sitting and/or standing);Decreased cognition;Decreased knowledge of use of DME or AE;Impaired UE functional use;Pain OT Treatment Interventions: Self-care/ADL training;Therapeutic exercise;DME and/or AE instruction;Therapeutic activities;Patient/family education;Balance training   OT Goals Acute Rehab OT Goals OT Goal Formulation: With patient Time For Goal Achievement: 06/09/12 Potential to Achieve Goals: Good ADL Goals Pt Will Transfer to Toilet: with min assist;Stand pivot transfer;3-in-1 ADL Goal: Toilet Transfer - Progress: Goal set today Additional ADL Goal #1: Family will be independent with PROM Lt elbow, wrist, hand, and forearm ADL Goal: Additional Goal #1 - Progress: Goal set today Additional ADL Goal #2: Family will be independent with safely assisting pt with UB ADLs and sling wear and care ADL Goal: Additional Goal #2 - Progress: Goal set today  Visit Information  Last OT Received On: 05/26/12 Assistance Needed: +1    Subjective Data  Subjective: "I feel so much better than I did.  They are giving me pain medication and it doesn't hurt anymore" Patient Stated Goal: To get better   Prior Functioning     Home Living Lives With: Family Available Help at Discharge: Family Type of Home: House Home Access: Stairs to enter [employer] of Steps: 2 Home Layout: Two level;Bed/bath upstairs Alternate Level Stairs-Number of Steps:  14 Bathroom Shower/Tub: Network engineer: Wheelchair - manual Additional Comments: pt reports she lives upstairs but has been w/c bound for an undetermined amount of time.  (Pt states she ambulates short distances with assist) Prior Function Level of Independence: Needs assistance Needs Assistance: Bathing;Dressing;Feeding;Grooming;Toileting;Meal Prep;Light Housekeeping;Transfers Bath: Moderate Dressing: Total Feeding: Minimal Grooming: Minimal Toileting: Moderate Meal Prep: Total Light Housekeeping: Total Transfer Assistance: unable to determine accurately.  Pt does state she was ambulating into BR with family, and that she was receiving HHPT and was feeling stronger and more independent PTA Driving: No Vocation: Retired Comments: Daughter assists pt with all ADLs Communication Communication: No difficulties Dominant Hand: Right         Vision/Perception Vision - History Patient Visual Report: No change from baseline Vision - Assessment Vision Assessment: Vision not tested   [employer] Arousal/Alertness: Awake/alert Behavior During Therapy: WFL for tasks assessed/performed Overall Cognitive Status: No family/caregiver present to determine baseline cognitive functioning    Extremity/Trunk Assessment Right Upper Extremity Assessment RUE ROM/Strength/Tone: Deficits RUE ROM/Strength/Tone Deficits: strength grossly 3/5 RUE Coordination: WFL - gross/fine motor Left Upper Extremity Assessment LUE ROM/Strength/Tone: Due to precautions;Unable to fully assess LUE Coordination: Deficits LUE Coordination Deficits: Pt with h/o Lt hemiplegia due to CVA - pt reports Lt. UE non functional.  Now with new proximal humeral fracture Trunk Assessment Trunk Assessment: Kyphotic     Mobility Bed Mobility Bed Mobility: Supine to Sit;Sitting - Scoot to Edge of Bed;Sit to Supine Supine to Sit: 3: Mod assist;HOB elevated;With  rails Sitting - Scoot to Edge of Bed: 3: Mod assist Sit to Supine: 3: Mod assist;With rail;HOB elevated Details for Bed Mobility Assistance: Pt assisted with all aspects of bed mobility Transfers Transfers: Sit to Stand;Stand to Sit Sit to Stand: 3: Mod assist;Without upper extremity assist;From bed Stand to Sit: 3: Mod assist;Without upper extremity assist;To bed Details for Transfer Assistance: Pt with posterior bias     Exercise     Balance Balance Balance Assessed: Yes Static Sitting Balance Static Sitting - Balance Support: Right upper extremity supported;Feet unsupported Static Sitting - Level of Assistance: 5: Stand by assistance   End of Session OT - End of Session Activity Tolerance: Patient tolerated treatment well Patient left: in bed;with call bell/phone within reach;with nursing in room Nurse Communication: Mobility status  GO     Jeani Hawking M 05/26/2012, 3:24 PM

## 2012-05-26 NOTE — Evaluation (Signed)
Physical Therapy Evaluation Patient Details Name: Veronica Powers MRN: [id number] DOB: [birth year] Today's Date: 05/26/2012 Time: 0810-0840 PT Time Calculation (min): 30 min  PT Assessment / Plan / Recommendation Clinical Impression  Pt is an 79 yo female w/ a L humeral fracture and a h/o CHF. Pt is w/c bound and has extensive help at home. It is unclear how long pt has needed assist w/ ADLs but pt functioning near PTA baseline. Pt is limited by generalized weakness and L UE mobility. Acute PT is idicated to address generalized weakness and limit further decline in mobility w/ hospital stay. Pt would recquire 24/7 assistance for return home and HHPT to improve activity tolerance.     PT Assessment  Patient needs continued PT services    Follow Up Recommendations  Supervision/Assistance - 24 hour;Home health PT    Does the patient have the potential to tolerate intense rehabilitation      Barriers to Discharge        Equipment Recommendations       Recommendations for Other Services     Frequency Min 3X/week    Precautions / Restrictions Precautions Precautions: Fall Required Braces or Orthoses:  (sling on L shoulder) Restrictions Weight Bearing Restrictions: No   Pertinent Vitals/Pain Pt denies pain      Mobility  Bed Mobility Bed Mobility: Supine to Sit;Sitting - Scoot to Edge of Bed Supine to Sit: 2: Max assist;HOB elevated Sitting - Scoot to [employer] of Bed: 2: Max assist Details for Bed Mobility Assistance: pt w/ limited bed mobility secondary to weakness and inability to use L arm (sling) Transfers Transfers: Editor, commissioning Transfers: 1: +1 Total assist Details for Transfer Assistance: Pt w/ posterior trunk lean, required v/c's to lean forward and wrap R arm around therapist. Pt able to bear 20% of weight in standing. Stand pivot from bed to chair. Ambulation/Gait Ambulation/Gait Assistance: Not tested (comment) (pt w/c bound) Wheelchair  Mobility Wheelchair Mobility: No (pt w/ sling on L UE)    Exercises     PT Diagnosis: Generalized weakness  PT Problem List: Decreased strength;Decreased range of motion;Decreased activity tolerance;Decreased coordination;Decreased balance PT Treatment Interventions: Functional mobility training;Therapeutic activities;Therapeutic exercise;Balance training;Neuromuscular re-education   PT Goals Acute Rehab PT Goals Time For Goal Achievement: 06/09/12 Potential to Achieve Goals: Fair Pt will go Supine/Side to Sit: with mod assist;with HOB 0 degrees PT Goal: Supine/Side to Sit - Progress: Goal set today Pt will Sit at Edge of Bed: with supervision;3-5 min;with unilateral upper extremity support PT Goal: Sit at Edge Of Bed - Progress: Goal set today Pt will Transfer Bed to Chair/Chair to Bed: with max assist;with cues (comment type and amount) PT Transfer Goal: Bed to Chair/Chair to Bed - Progress: Goal set today Pt will Perform Home Exercise Program: with supervision, verbal cues required/provided PT Goal: Perform Home Exercise Program - Progress: Goal set today  Visit Information  Last PT Received On: 05/26/12 Assistance Needed: +1    Subjective Data  Subjective: Pt recieved in bed, agreeable to PT   Prior Functioning  Home Living Lives With: Family Available Help at Discharge: Family Type of Home: House Home Access: Stairs to enter Secretary/administrator of Steps: 2 Home Layout: Two level;Bed/bath upstairs Alternate Level Stairs-Number of Steps: 14 Bathroom Shower/Tub: Engineer, manufacturing systems: Standard Home Adaptive Equipment: Wheelchair - manual Additional Comments: pt reports she lives upstairs but has been w/c bound for an undetermined amount of time.  Prior Function Level of Independence: Needs assistance  Needs Assistance: Bathing;Dressing;Feeding;Grooming;Toileting;Meal Prep;Light Housekeeping;Transfers Driving: No Vocation: Retired Comments: Daughter assists  pt with all ADLs Communication Communication: No difficulties Dominant Hand: Right    Cognition  Cognition Arousal/Alertness: Lethargic Behavior During Therapy: Flat affect Overall Cognitive Status: No family/caregiver present to determine baseline cognitive functioning    Extremity/Trunk Assessment Right Upper Extremity Assessment RUE ROM/Strength/Tone: Deficits RUE ROM/Strength/Tone Deficits: Pt with limited range to cross midline  Left Upper Extremity Assessment LUE ROM/Strength/Tone: Unable to fully assess (L arm in sling with humeral fracture) Right Lower Extremity Assessment RLE ROM/Strength/Tone: Deficits RLE ROM/Strength/Tone Deficits: pt is w/c bound and w/ limited strength Left Lower Extremity Assessment LLE ROM/Strength/Tone: Deficits LLE ROM/Strength/Tone Deficits: pt is w/c bound and w/ limited strength Trunk Assessment Trunk Assessment: Kyphotic   Balance Balance Balance Assessed: Yes Static Sitting Balance Static Sitting - Balance Support: Right upper extremity supported;Feet unsupported Static Sitting - Level of Assistance: 5: Stand by assistance Static Sitting - Comment/# of Minutes: 2 mins. Pt able to remain in sitting but w/ increased posterior trunk lean with fatigue leading to LOB. Min-mod assist to return to upright position  End of Session PT - End of Session Equipment Utilized During Treatment: Gait belt;Oxygen Activity Tolerance: Patient tolerated treatment well Patient left: in chair;with call bell/phone within reach (breakfast present) Nurse Communication: Mobility status (RUE  AROM for eating and +1 Total transfer)  GP     05/26/2012, 11:24 AM Marvis Moeller, Student Physical Therapist Office #: [phone]

## 2012-05-26 NOTE — Consult Note (Signed)
Reason for Consult:Left arm pain Referring Physician: Dr. Danie Powers is an 79 y.o. female.  HPI: Veronica Powers is an 79 year old patient who lives with her 2 daughters. Patient went in atrial fibrillation 2 days ago and sustained a fall. Patient describes injury to the left arm. She is a minimal ambulator and has a stroke which affected her left hand side. She doesn't report some pain in the left shoulder region and denies any other orthopedic complaints  Past Medical History  Diagnosis Date  . Atrial fibrillation     a. permanent - on coumadin.  . Chronic systolic CHF (congestive heart failure)     a. 02/20/2012 Echo: EF 40-50%, diff HK, mod to sev AS, mild AI (valve area 1.09cm2 Vmax), mild MR, mod to sev dil LA, mildl dil RV.  Marland Kitchen Aortic stenosis, moderate     a. 02/2012 Echo:  valve area 1.09cm2 Vmax  . Diabetes     type 2  . Stroke     left sided residual  . Hypertension   . Gout   . GERD (gastroesophageal reflux disease)   . Bradycardia     a. noted to limit tx of afib - concern for possible ppm need.    Past Surgical History  Procedure Laterality Date  . Cholecystectomy    . Cesarean section      Family History  Problem Relation Age of Onset  . Coronary artery disease    . Cancer    . Coronary artery disease Mother   . Cancer Father     Social History:  reports that she has never smoked. She has never used smokeless tobacco. She reports that she does not drink alcohol or use illicit drugs.  Allergies:  Allergies  Allergen Reactions  . Nifedipine     REACTION: made her weak    Medications: I have reviewed the patient's current medications.  Results for orders placed during the hospital encounter of 05/25/12 (from the past 48 hour(s))  HEPARIN LEVEL (UNFRACTIONATED)     Status: Abnormal   Collection Time    05/25/12 12:40 AM      Result Value Range   Heparin Unfractionated 0.19 (*) 0.30 - 0.70 IU/mL   Comment:            IF HEPARIN RESULTS ARE BELOW      EXPECTED VALUES, AND PATIENT     DOSAGE HAS BEEN CONFIRMED,     SUGGEST FOLLOW UP TESTING     OF ANTITHROMBIN III LEVELS.  CBC     Status: Abnormal   Collection Time    05/25/12 12:40 AM      Result Value Range   WBC 6.4  4.0 - 10.5 K/uL   RBC 3.82 (*) 3.87 - 5.11 MIL/uL   Hemoglobin 11.8 (*) 12.0 - 15.0 g/dL   HCT 16.1 (*) 09.6 - 04.5 %   MCV 88.7  78.0 - 100.0 fL   MCH 30.9  26.0 - 34.0 pg   MCHC 34.8  30.0 - 36.0 g/dL   RDW 40.9 (*) 81.1 - 91.4 %   Platelets 245  150 - 400 K/uL  URINALYSIS, ROUTINE W REFLEX MICROSCOPIC     Status: Abnormal   Collection Time    05/25/12  1:05 PM      Result Value Range   Color, Urine YELLOW  YELLOW   APPearance CLEAR  CLEAR   Specific Gravity, Urine 1.022  1.005 - 1.030   pH 5.0  5.0 - 8.0  Glucose, UA NEGATIVE  NEGATIVE mg/dL   Hgb urine dipstick NEGATIVE  NEGATIVE   Bilirubin Urine SMALL (*) NEGATIVE   Ketones, ur 15 (*) NEGATIVE mg/dL   Protein, ur >454 (*) NEGATIVE mg/dL   Urobilinogen, UA 0.2  0.0 - 1.0 mg/dL   Nitrite NEGATIVE  NEGATIVE   Leukocytes, UA TRACE (*) NEGATIVE  URINE MICROSCOPIC-ADD ON     Status: Abnormal   Collection Time    05/25/12  1:05 PM      Result Value Range   Squamous Epithelial / LPF RARE  RARE   WBC, UA 0-2  <3 WBC/hpf   Bacteria, UA FEW (*) RARE   Casts HYALINE CASTS (*) NEGATIVE  PROTIME-INR     Status: Abnormal   Collection Time    05/25/12  1:16 PM      Result Value Range   Prothrombin Time 20.3 (*) 11.6 - 15.2 seconds   INR 1.81 (*) 0.00 - 1.49  CBC     Status: Abnormal   Collection Time    05/25/12  1:16 PM      Result Value Range   WBC 7.0  4.0 - 10.5 K/uL   RBC 3.81 (*) 3.87 - 5.11 MIL/uL   Hemoglobin 11.6 (*) 12.0 - 15.0 g/dL   HCT 09.8 (*) 11.9 - 14.7 %   MCV 88.2  78.0 - 100.0 fL   MCH 30.4  26.0 - 34.0 pg   MCHC 34.5  30.0 - 36.0 g/dL   RDW 82.9 (*) 56.2 - 13.0 %   Platelets 254  150 - 400 K/uL  COMPREHENSIVE METABOLIC PANEL     Status: Abnormal   Collection Time     05/25/12  1:16 PM      Result Value Range   Sodium 140  135 - 145 mEq/L   Potassium 3.8  3.5 - 5.1 mEq/L   Chloride 104  96 - 112 mEq/L   CO2 24  19 - 32 mEq/L   Glucose, Bld 141 (*) 70 - 99 mg/dL   BUN 25 (*) 6 - 23 mg/dL   Creatinine, Ser 8.65 (*) 0.50 - 1.10 mg/dL   Calcium 9.0  8.4 - 78.4 mg/dL   Total Protein 7.2  6.0 - 8.3 g/dL   Albumin 3.4 (*) 3.5 - 5.2 g/dL   AST 23  0 - 37 U/L   ALT 33  0 - 35 U/L   Alkaline Phosphatase 71  39 - 117 U/L   Total Bilirubin 1.3 (*) 0.3 - 1.2 mg/dL   GFR calc non Af Amer 31 (*) >90 mL/min   GFR calc Af Amer 35 (*) >90 mL/min   Comment:            The eGFR has been calculated     using the CKD EPI equation.     This calculation has not been     validated in all clinical     situations.     eGFR's persistently     <90 mL/min signify     possible Chronic Kidney Disease.  MRSA PCR SCREENING     Status: None   Collection Time    05/25/12  5:15 PM      Result Value Range   MRSA by PCR NEGATIVE  NEGATIVE   Comment:            The GeneXpert MRSA Assay (FDA     approved for NASAL specimens     only), is one component of a  comprehensive MRSA colonization     surveillance program. It is not     intended to diagnose MRSA     infection nor to guide or     monitor treatment for     MRSA infections.  GLUCOSE, CAPILLARY     Status: Abnormal   Collection Time    05/25/12  5:18 PM      Result Value Range   Glucose-Capillary 118 (*) 70 - 99 mg/dL   Comment 1 Notify RN    GLUCOSE, CAPILLARY     Status: Abnormal   Collection Time    05/25/12  9:19 PM      Result Value Range   Glucose-Capillary 109 (*) 70 - 99 mg/dL  CBC     Status: Abnormal   Collection Time    05/26/12  5:25 AM      Result Value Range   WBC 5.6  4.0 - 10.5 K/uL   RBC 3.42 (*) 3.87 - 5.11 MIL/uL   Hemoglobin 10.5 (*) 12.0 - 15.0 g/dL   HCT 40.9 (*) 81.1 - 91.4 %   MCV 89.2  78.0 - 100.0 fL   MCH 30.7  26.0 - 34.0 pg   MCHC 34.4  30.0 - 36.0 g/dL   RDW 78.2 (*)  95.6 - 15.5 %   Platelets 212  150 - 400 K/uL  BASIC METABOLIC PANEL     Status: Abnormal   Collection Time    05/26/12  5:25 AM      Result Value Range   Sodium 140  135 - 145 mEq/L   Potassium 4.6  3.5 - 5.1 mEq/L   Comment: HEMOLYSIS AT THIS LEVEL MAY AFFECT RESULT   Chloride 106  96 - 112 mEq/L   CO2 18 (*) 19 - 32 mEq/L   Glucose, Bld 104 (*) 70 - 99 mg/dL   BUN 26 (*) 6 - 23 mg/dL   Creatinine, Ser 2.13 (*) 0.50 - 1.10 mg/dL   Calcium 7.9 (*) 8.4 - 10.5 mg/dL   GFR calc non Af Amer 33 (*) >90 mL/min   GFR calc Af Amer 39 (*) >90 mL/min   Comment:            The eGFR has been calculated     using the CKD EPI equation.     This calculation has not been     validated in all clinical     situations.     eGFR's persistently     <90 mL/min signify     possible Chronic Kidney Disease.  GLUCOSE, CAPILLARY     Status: Abnormal   Collection Time    05/26/12  7:54 AM      Result Value Range   Glucose-Capillary 61 (*) 70 - 99 mg/dL  GLUCOSE, CAPILLARY     Status: Abnormal   Collection Time    05/26/12  8:18 AM      Result Value Range   Glucose-Capillary 101 (*) 70 - 99 mg/dL    Dg Shoulder Left  0/08/6576  *RADIOLOGY REPORT*  Clinical Data: Fall  LEFT SHOULDER - 2+ VIEW  Comparison: None.  Findings: Displaced fracture of the humeral neck with lateral displacement.  No other fracture.  Glenohumeral joint is normal.  Mild AC separation on the left which could be chronic.  IMPRESSION: Displaced fracture of left humeral neck.   Original Report Authenticated By: Janeece Riggers, M.D.    Dg Humerus Left  05/25/2012  *RADIOLOGY REPORT*  Clinical Data: Fall  LEFT HUMERUS - 2+ VIEW  Comparison: None.  Findings: Transverse fracture through the humeral neck with displacement laterally.  No other fracture of the humerus.  IMPRESSION: Displaced fracture of the neck of the humerus.   Original Report Authenticated By: Janeece Riggers, M.D.     Review of Systems  Constitutional: Negative.   Eyes:  Negative.   Respiratory: Negative.   Cardiovascular: Positive for palpitations.  Gastrointestinal: Negative.   Musculoskeletal: Positive for joint pain.  Skin: Negative.   Neurological: Negative.   Psychiatric/Behavioral: Negative.    Blood pressure 118/72, pulse 70, temperature 97.6 F (36.4 C), temperature source Oral, resp. rate 19, height 4\' 11"  (1.499 m), weight 50.9 kg (112 lb 3.4 oz), SpO2 100.00%. Physical Exam  Constitutional: She appears well-developed.  HENT:  Head: Normocephalic.  Eyes: Pupils are equal, round, and reactive to light.  Neck: Normal range of motion.  Cardiovascular: Normal rate.   Respiratory: Effort normal.  Neurological: She is alert.  Skin: Skin is warm.   examination of the left arm demonstrates no real voluntary motor movement of the left hand consistent with her stroke she does have a perfused hand bruising is noted in left shoulder region elbow range of motion has no crepitus the left-hand side  Assessment/Plan: Impression is left shoulder proximal humerus fracture three-part with mild displacement plan sling immobilization with return to clinic in 2 weeks for repeat radiographs she is not a great operative candidate and I think this fracture is appreciated chance of healing without operative intervention see her back in 2 weeks for clinical recheck and did talk with the daughters about avoiding pulling out through that shoulder the recent old fracture healing has occurred  Harvie Morua SCOTT 05/26/2012, 10:44 AM

## 2012-05-26 NOTE — Progress Notes (Signed)
Hypoglycemic Event  CBG:61  Treatment: 15 GM carbohydrate snack  Symptoms: None  Follow-up CBG: [id number] CBG Result:101  Possible Reasons for Event: Unknown and Other: Unknown  Comments/MD notified:NP Junious Silk informed    Veronica Powers  Remember to initiate Hypoglycemia Order Set & complete

## 2012-05-26 NOTE — Progress Notes (Signed)
[facility]  Patient Status: Active (receiving services up to time of hospitalization)  [facility] is providing the following services: RN  If patient discharges after hours, please call [phone].   Veronica Powers 05/26/2012, 11:13 AM

## 2012-05-26 NOTE — Progress Notes (Signed)
ANTICOAGULATION CONSULT NOTE   Pharmacy Consult for UFH Indication: atrial fibrillation  Allergies  Allergen Reactions  . Nifedipine     REACTION: made her weak    Patient Measurements: Height: 4\' 11"  (149.9 cm) Weight: 112 lb 3.4 oz (50.9 kg) IBW/kg (Calculated) : 43.2 Heparin Dosing Weight: 56kg  Vital Signs: Temp: 97.7 F (36.5 C) (05/08 1135) Temp src: Oral (05/08 1135) BP: 122/85 mmHg (05/08 1400) Pulse Rate: 91 (05/08 1400)  Labs:  Recent Labs  05/25/12 0040 05/25/12 1316 05/26/12 0525 05/26/12 1100  HGB 11.8* 11.6* 10.5*  --   HCT 33.9* 33.6* 30.5*  --   PLT 245 254 212  --   LABPROT  --  20.3*  --   --   INR  --  1.81*  --   --   HEPARINUNFRC 0.19*  --   --  0.36  CREATININE  --  1.51* 1.40*  --     Estimated Creatinine Clearance: 20.4 ml/min (by C-G formula based on Cr of 1.4).  Assessment: 79 y/o female with h/o Afib CVR ont on any rate controlling meds.   Coumadin pta INR 1.8 on admit - currently  on hold pending possible surgery for arm fx.  Ortho note seems to defer surgery for now - will f/u plan for restart warfarin.   Heparin while couamdin being held -drip rate 850 uts/hr HL 0.36 at goal.  No bleeding noted, cbc stable.   Goal of Therapy:  Heparin level 0.3-0.7 units/ml Monitor platelets by anticoagulation protocol: Yes   Plan:  Continue Heparin 850 units/hr Check daily CBC, Heparin level Follow up restart Coumadin   [name] Pharm.D. CPP, BCPS Clinical Pharmacist [phone] 05/26/2012 2:49 PM

## 2012-05-26 NOTE — Plan of Care (Signed)
Problem: Phase I Progression Outcomes Goal: OOB as tolerated unless otherwise ordered Outcome: Progressing Pt was placed in chair by PT this am

## 2012-05-26 NOTE — Progress Notes (Signed)
Pt has not voided for 12 hours. States she does not have the urge to urinate at this time. Bladder scan showed 150cc in bladder. K.Kirby, NP notified. New orders received. Will monitor.  M.Foster Simpson, RN

## 2012-05-26 NOTE — Progress Notes (Signed)
ANTICOAGULATION CONSULT NOTE   Pharmacy Consult for UFH Indication: atrial fibrillation  Allergies  Allergen Reactions  . Nifedipine     REACTION: made her weak    Patient Measurements: Height: 4\' 11"  (149.9 cm) Weight: 112 lb 3.4 oz (50.9 kg) IBW/kg (Calculated) : 43.2 Heparin Dosing Weight: 56kg  Vital Signs: Temp: 97.5 F (36.4 C) (05/07 2347) Temp src: Oral (05/07 2347) BP: 155/88 mmHg (05/07 2347) Pulse Rate: 82 (05/07 2347)  Labs:  Recent Labs  05/25/12 0040 05/25/12 1316  HGB 11.8* 11.6*  HCT 33.9* 33.6*  PLT 245 254  LABPROT  --  20.3*  INR  --  1.81*  HEPARINUNFRC 0.19*  --   CREATININE  --  1.51*    Estimated Creatinine Clearance: 18.9 ml/min (by C-G formula based on Cr of 1.51).  Assessment: 79 y/o female with h/o Afib, Coumadin on hold, for heparin   Goal of Therapy:  Heparin level 0.3-0.7 units/ml Monitor platelets by anticoagulation protocol: Yes   Plan:  Increase Heparin 850 units/hr Check heparin level in 8 hours.  Geannie Risen, PharmD, BCPS  05/26/2012,2:15 AM

## 2012-05-26 NOTE — Progress Notes (Signed)
[facility] Progress Note [place] TEAM 1 - Stepdown/ICU TEAM   Veronica Powers ZOX:[id number] DOB: Jun 28, 1925 DOA: 05/25/2012 PCP: Laurena Slimmer, MD  Brief narrative: 79 year old female patient with known vascular disease including previous CVA and chronic itch or fibrillation as well as chronic systolic heart failure and diabetes. Recent admission March 2014 for non-STEMI related to acute kidney injury and dehydration and atrial fibrillation with rapid ventricular response. She presented to the emergency department after experiencing left shoulder pain after falling the night before while attempting to utilize the restroom. The pain was uncontrollable at home therefore the patient presented to the emergency department. In the emergency department the patient was in quite a bit of pain and was also experiencing rapid ventricular response with her atrophic fibrillation but had no chest pain palpitations or shortness of breath. X-ray of the left upper extremity revealed a closed but displaced left humeral neck fracture. The ER physician has already notified Dr. August Saucer of need for consultation.  Assessment/Plan:   Fall at home/Left humeral neck fracture-displaced -confirmed w/ Dr. Diamantina Providence ofc he will see pt today -cont immobilization -medically stable for OR if indicated- moderate risk given age, recent NSTEMI, underlying AFib and aortic stenosis but benefit outweighs risks -eventual PT eval    Atrial fibrillation with RVR -now rate controlled and likely RVR at presentation 2/2 pain -appreciate Cards eval     Chronic systolic congestive heart failure, NYHA class 2 -compensated and actually clinically dry    Diabetes mellitus type II, controlled -diet controlled -CBG low this am so will dc SSI and follow    CKD (chronic kidney disease) stage 3, GFR 30-59 ml/min with Prerenal azotemia & Dehydration -low UOP and bladder scan OK -mildly acidotic and BUN/Scr have changed little since  admit -give 500 cc NS bolus and cont IVF at 100/hr -not on diuretics at home -orthostatic: 137/88 supine/121/84 sitting/118/72 standing with associated transient increase in HR    HYPERTENSION -BP OK with sitting but since orthostatic will cont IVF as above and hold anti-HTN meds    AORTIC STENOSIS (moderate to severe) -avoid dehydration- suspect DH may have contributed to her fall    Chronic anticoagulation -INR not therapeutic so on IV Heparin -may need to reverse further if OR indicated -management per pharmacy    H/O: CVA (cerebrovascular accident)/FTT (failure to thrive) in adult -seems to have dementia like process -will need eventual PT/OT eval   DVT prophylaxis: IV heparin Code Status: DO NOT RESUSCITATE  Family Communication: Spoke with patient-daughter not at bedside Disposition Plan: Stepdown Isolation: Contact isolation for MRSA PCR positive status  Consultants: Cardiology Orthopedics  Procedures: None  Antibiotics: None  HPI/Subjective: Patient awake and endorses left upper extremity discomfort. States remember falling but does not remember how or why she fell but did not describe any symptoms that would be consistent with loss of consciousness preceding the fall or true syncope. Currently denies chest pain or shortness of breath   Objective: Blood pressure 118/72, pulse 70, temperature 97.6 F (36.4 C), temperature source Oral, resp. rate 19, height 4\' 11"  (1.499 m), weight 50.9 kg (112 lb 3.4 oz), SpO2 100.00%.  Intake/Output Summary (Last 24 hours) at 05/26/12 1025 Last data filed at 05/26/12 1000  Gross per 24 hour  Intake 1869.65 ml  Output      0 ml  Net 1869.65 ml     Exam: General: No acute respiratory distress Lungs: Clear to auscultation bilaterally without wheezes or crackles, 2 L Cardiovascular: Iregular rate and  rhythm without gallop or rub, grade 2/6 systolic murmur  Over aortic area, atrial fibrillation, no peripheral edema or JVD,  IV heparin Abdomen: Nontender, nondistended, soft, bowel sounds positive, no rebound, no ascites, no appreciable mass Musculoskeletal: No significant cyanosis, clubbing of bilateral lower extremities, left arm in sling and tender to light palpation over humerus Neurological: Alert and oriented x 3, moves all extremities x 4 without focal neurological deficits, CN 2-12 intact  Data Reviewed: Basic Metabolic Panel:  Recent Labs Lab 05/25/12 1316 05/26/12 0525  NA 140 140  K 3.8 4.6  CL 104 106  CO2 24 18*  GLUCOSE 141* 104*  BUN 25* 26*  CREATININE 1.51* 1.40*  CALCIUM 9.0 7.9*   Liver Function Tests:  Recent Labs Lab 05/25/12 1316  AST 23  ALT 33  ALKPHOS 71  BILITOT 1.3*  PROT 7.2  ALBUMIN 3.4*   No results found for this basename: LIPASE, AMYLASE,  in the last 168 hours No results found for this basename: AMMONIA,  in the last 168 hours CBC:  Recent Labs Lab 05/25/12 0040 05/25/12 1316 05/26/12 0525  WBC 6.4 7.0 5.6  HGB 11.8* 11.6* 10.5*  HCT 33.9* 33.6* 30.5*  MCV 88.7 88.2 89.2  PLT 245 254 212   Cardiac Enzymes: No results found for this basename: CKTOTAL, CKMB, CKMBINDEX, TROPONINI,  in the last 168 hours BNP (last 3 results)  Recent Labs  02/19/12 1050  PROBNP 1951.0*   CBG:  Recent Labs Lab 05/25/12 1718 05/25/12 2119 05/26/12 0754 05/26/12 0818  GLUCAP 118* 109* 61* 101*    Recent Results (from the past 240 hour(s))  MRSA PCR SCREENING     Status: None   Collection Time    05/25/12  5:15 PM      Result Value Range Status   MRSA by PCR NEGATIVE  NEGATIVE Final   Comment:            The GeneXpert MRSA Assay (FDA     approved for NASAL specimens     only), is one component of a     comprehensive MRSA colonization     surveillance program. It is not     intended to diagnose MRSA     infection nor to guide or     monitor treatment for     MRSA infections.     Studies:  Recent x-ray studies have been reviewed in detail by the  Attending Physician  Scheduled Meds:  Reviewed in detail by the Attending Physician   Junious Silk, ANP [facility] Office  [phone] Pager [phone]  On-Call/Text Page:      [email or web address]      password [email or web address]  If 7PM-7AM, please contact night-coverage [email or web address] Password [facility] 05/26/2012, 10:25 AM   LOS: 1 day   I have examined the patient, reviewed the chart and modified the above note which I agree with.   Andrienne Havener,MD [phone] 05/26/2012, 3:16 PM

## 2012-05-26 NOTE — Progress Notes (Addendum)
1400:  Nurse communicated to PA that pt still had not voided post last bolus.  PA provided the following instructions; nurse to perform in/out cath with the maximum occurences of 2, if at that time pt continues to retain urine nurse should contact MD for foley catheter order.   Nurse will carry out orders as instructed    1011: Pt has not voided since admit to unit, MD aware.  Pt received 250 bolus during 0600 hour per intervention ordered by MD.  Nurse performed bladder scan during 1000 hour and 291 was noted in bladder.  Nurse contacted MD; 500 bolus was ordered.  Nurse will administer as ordered and will continue to monitor.

## 2012-05-26 NOTE — Progress Notes (Signed)
INITIAL NUTRITION ASSESSMENT  DOCUMENTATION CODES Per approved criteria  -Not Applicable   INTERVENTION: 1.  Supplements; Glucerna once daily 2.  Recommend obtain new wt  NUTRITION DIAGNOSIS: Unintended wt loss related to recent illness as evidenced by >50 lbs wt loss in ~2 years.   Goal: Pt to meet >/=90% estimated needs  Monitor:  PO intake, wt, I/Os  Reason for Assessment: MST  79 y.o. female  Admitting Dx: Left humeral fracture  ASSESSMENT: Pt admitted with L arm pain s/p fall 2 days ago.  Found to have L proximal humerus fx. Pt with limited mobility at baseline.  Lives at home with daughters.  Chart review shows pt's usual wt is ~165 lbs, last weighed in 2012.  Pt has progressively lost 53 lbs in the past 2 years. Pt lost 28 lbs in the past 6 months (20% UBW).   Pt reports a great deal of wt loss due to recent illness when she was unable to eat.  Pt states that now her appetite is excellent and she eats well.  RN in room confirms pt consumed >75% of breakfast this am.  Nutrition Focused Physical Exam:  Subcutaneous Fat:  Orbital Region: wnl Upper Arm Region: wnl Thoracic and Lumbar Region: n/a  Muscle:  Temple Region: wnl Clavicle Bone Region: wnl Clavicle and Acromion Bone Region: wnl Scapular Bone Region: n/a Dorsal Hand: wnl Patellar Region: n/a Anterior Thigh Region: n/a Posterior Calf Region: moderate  Edema: none present  Pt with h/o poor intake and wt loss.  Wt may have stabilized more recently.  No evidence of wasting on physical exam and good intake as inpatient.  RD suspects pt with h/o of malnutrition which is improving with good intake at home.  Pt reports she drinks Glucerna which will be ordered.  RD to follow.  Recommend re-weigh due to discrepancy in weight on admission.  Height: Ht Readings from Last 1 Encounters:  05/25/12 4\' 11"  (1.499 m)    Weight: Wt Readings from Last 1 Encounters:  05/25/12 112 lb 3.4 oz (50.9 kg)    Ideal Body  Weight: 98 lbs  % Ideal Body Weight: 115%  Wt Readings from Last 10 Encounters:  05/25/12 112 lb 3.4 oz (50.9 kg)  04/03/12 123 lb 9.6 oz (56.065 kg)  03/08/12 139 lb (63.05 kg)  02/22/12 126 lb (57.153 kg)  10/02/11 139 lb (63.05 kg)  07/21/10 164 lb 12.8 oz (74.753 kg)  12/18/09 165 lb (74.844 kg)  12/04/08 169 lb (76.658 kg)  10/24/08 167 lb (75.751 kg)    Usual Body Weight: 165 lbs, last weighed in 2012  % Usual Body Weight: 67%  BMI:  Body mass index is 22.65 kg/(m^2).  Estimated Nutritional Needs: Kcal: 1250-1400 Protein: 60-70g Fluid: ~1.5 L/day  Skin: intact, non-pitting edema  Diet Order: Carb Control  EDUCATION NEEDS: -Education needs addressed   Intake/Output Summary (Last 24 hours) at 05/26/12 1126 Last data filed at 05/26/12 1000  Gross per 24 hour  Intake 1869.65 ml  Output      0 ml  Net 1869.65 ml    Last BM: none since admission  Labs:   Recent Labs Lab 05/25/12 1316 05/26/12 0525  NA 140 140  K 3.8 4.6  CL 104 106  CO2 24 18*  BUN 25* 26*  CREATININE 1.51* 1.40*  CALCIUM 9.0 7.9*  GLUCOSE 141* 104*    CBG (last 3)   Recent Labs  05/25/12 2119 05/26/12 0754 05/26/12 0818  GLUCAP 109* 61* 101*  Scheduled Meds: . sodium chloride   Intravenous STAT  . aspirin EC  81 mg Oral Daily  . atorvastatin  20 mg Oral Daily  . bimatoprost  1 drop Both Eyes QHS  . brimonidine  1 drop Both Eyes BID  . brinzolamide  1 drop Both Eyes BID  . docusate sodium  100 mg Oral BID  . guanFACINE  2 mg Oral QHS  . multivitamin with minerals  1 tablet Oral Daily  . pilocarpine  1 drop Both Eyes QID    Continuous Infusions: . sodium chloride 20 mL/hr at 05/25/12 1358  . sodium chloride 100 mL/hr at 05/25/12 2326  . heparin 850 Units/hr (05/26/12 0227)    Past Medical History  Diagnosis Date  . Atrial fibrillation     a. permanent - on coumadin.  . Chronic systolic CHF (congestive heart failure)     a. 02/20/2012 Echo: EF 40-50%, diff  HK, mod to sev AS, mild AI (valve area 1.09cm2 Vmax), mild MR, mod to sev dil LA, mildl dil RV.  Marland Kitchen Aortic stenosis, moderate     a. 02/2012 Echo:  valve area 1.09cm2 Vmax  . Diabetes     type 2  . Stroke     left sided residual  . Hypertension   . Gout   . GERD (gastroesophageal reflux disease)   . Bradycardia     a. noted to limit tx of afib - concern for possible ppm need.    Past Surgical History  Procedure Laterality Date  . Cholecystectomy    . Cesarean section      Loyce Dys, MS RD LDN Clinical Inpatient Dietitian Pager: [phone] Weekend/After hours pager: [phone]

## 2012-05-26 NOTE — Evaluation (Signed)
Agree with PT evaluation.  Iasiah Ozment, PT DPT [phone]  

## 2012-05-27 LAB — GLUCOSE, CAPILLARY: Glucose-Capillary: 142 mg/dL — ABNORMAL HIGH (ref 70–99)

## 2012-05-27 LAB — CBC
HCT: 30.1 % — ABNORMAL LOW (ref 36.0–46.0)
MCHC: 32.9 g/dL (ref 30.0–36.0)
RDW: 17.6 % — ABNORMAL HIGH (ref 11.5–15.5)

## 2012-05-27 LAB — BASIC METABOLIC PANEL
BUN: 17 mg/dL (ref 6–23)
GFR calc Af Amer: 49 mL/min — ABNORMAL LOW (ref >90)
GFR calc non Af Amer: 42 mL/min — ABNORMAL LOW (ref >90)
Potassium: 3.8 mEq/L (ref 3.5–5.1)

## 2012-05-27 MED ORDER — BISACODYL 10 MG RE SUPP: 10.0000 mg | Freq: Every day | RECTAL | Status: DC | PRN

## 2012-05-27 MED ORDER — SODIUM CHLORIDE 0.9 % IV SOLN
INTRAVENOUS | Status: DC
  Administered 2012-05-27 (×2): 1000 mL via INTRAVENOUS

## 2012-05-27 MED ORDER — FLEET ENEMA 7-19 GM/118ML RE ENEM
1.0000 | ENEMA | Freq: Every day | RECTAL | Status: DC | PRN
  Filled 2012-05-27: qty 1

## 2012-05-27 MED ORDER — DOCUSATE SODIUM 100 MG PO CAPS: 100.0000 mg | ORAL_CAPSULE | Freq: Two times a day (BID) | ORAL | Status: DC

## 2012-05-27 MED ORDER — POLYETHYLENE GLYCOL 3350 17 G PO PACK
17.0000 g | PACK | Freq: Every day | ORAL | Status: DC
  Administered 2012-05-27 – 2012-05-29 (×3): 17 g via ORAL
  Filled 2012-05-27 (×3): qty 1

## 2012-05-27 NOTE — Progress Notes (Addendum)
   1138: Nurse contacted PA to inform lab was unable to draw blood after two unsuccessful attempts.  Nurse also informed PA that during assessment pt demonstrated increased work of breathing with RR 20-30's for a brief period, difficulty measuring consistent oxygen saturation on pt (Nurse changed out probes, location of probes, cables and called facilities). Facilities determined monitor was not faulty.  MD acknowledged nurse concerns,no interventions were ordered at this time.  Nurse will continue to monitor and reassess.

## 2012-05-27 NOTE — Progress Notes (Signed)
At around 8pm, other staff nurse informed primary that there was some difficulty waking patient even with sternal rub while rounding. Once awakened, patient alert and oriented x 4 and relates having heard and felt nurse attempting to wake her.  Vitals 138/83, pulse rate 60-80's afib, 96% on 2 Liters nasal cannula. Continuing to monitor patient.

## 2012-05-27 NOTE — Progress Notes (Addendum)
Nurse communicated with PA that pt has not yet voided on her own, only 500 total urine collected since admit via in/out catheter.  PA wrote for foley catheter placement, nurse will continue to monitor

## 2012-05-27 NOTE — Progress Notes (Signed)
[facility] Progress Note [place] TEAM 1 - Stepdown/ICU TEAM   Anel Creighton ZOX:[id number] DOB: 02-04-1925 DOA: 05/25/2012 PCP: Laurena Slimmer, MD  Brief narrative: 79 year old female patient with known vascular disease including previous CVA and chronic itch or fibrillation as well as chronic systolic heart failure and diabetes. Recent admission March 2014 for non-STEMI related to acute kidney injury and dehydration and atrial fibrillation with rapid ventricular response. She presented to the emergency department after experiencing left shoulder pain after falling the night before while attempting to utilize the restroom. The pain was uncontrollable at home therefore the patient presented to the emergency department. In the emergency department the patient was in quite a bit of pain and was also experiencing rapid ventricular response with her atrophic fibrillation but had no chest pain palpitations or shortness of breath. X-ray of the left upper extremity revealed a closed but displaced left humeral neck fracture. The ER physician has already notified Dr. August Saucer of need for consultation.  Assessment/Plan:   Fall at home/Left humeral neck fracture-displaced -Dr. August Saucer recommends conservative non surgical management with immobilization and ofc FU in 2 weeks -eventual PT eval    Atrial fibrillation with RVR -now rate controlled and likely RVR at presentation 2/2 pain and volume depletion -appreciate Cards eval     Chronic systolic congestive heart failure, NYHA class 2 -compensated and actually clinically dry    Diabetes mellitus type II, controlled -diet controlled -CBG low this am so will dc SSI and follow    CKD (chronic kidney disease) stage 3, GFR 30-59 ml/min with Prerenal azotemia & Dehydration -low UOP and bladder scan OK -mildly acidotic and BUN/Scr have changed little since admit -given 500 cc NS bolus and cont IVF at 100/hr-order expired overnite but has been reordered  this am (5/9) -not on diuretics at home -orthostatic (5/8): 137/88 supine/121/84 sitting/118/72 standing with associated transient increase in HR   Constipation -add LOC    HYPERTENSION -BP OK with sitting but since orthostatic will cont IVF as above and hold anti-HTN meds    AORTIC STENOSIS (moderate to severe) -avoid dehydration- suspect DH may have contributed to her fall    Chronic anticoagulation -INR not therapeutic so on IV Heparin -management per pharmacy    H/O: CVA (cerebrovascular accident)/FTT (failure to thrive) in adult -seems to have dementia like process -will need eventual PT/OT eval   DVT prophylaxis: IV heparin Code Status: DO NOT RESUSCITATE  Family Communication: Spoke with patient-daughter not at bedside Disposition Plan: Stepdown Isolation: Contact isolation for MRSA PCR positive status  Consultants: Cardiology Orthopedics  Procedures: None  Antibiotics: None  HPI/Subjective: Patient awake and endorses left upper extremity discomfort. States remember falling but does not remember how or why she fell but did not describe any symptoms that would be consistent with loss of consciousness preceding the fall or true syncope. Currently denies chest pain or shortness of breath   Objective: Blood pressure 129/76, pulse 76, temperature 98 F (36.7 C), temperature source Oral, resp. rate 30, height 4\' 11"  (1.499 m), weight 57.7 kg (127 lb 3.3 oz), SpO2 100.00%.  Intake/Output Summary (Last 24 hours) at 05/27/12 1029 Last data filed at 05/27/12 0900  Gross per 24 hour  Intake    970 ml  Output    500 ml  Net    470 ml     Exam: General: No acute respiratory distress Lungs: Clear to auscultation bilaterally without wheezes or crackles, 2 L Cardiovascular: Iregular rate and rhythm without gallop or  rub, grade 2/6 systolic murmur  Over aortic area, atrial fibrillation, no peripheral edema or JVD, IV heparin Abdomen: Nontender, nondistended, soft,  bowel sounds positive, no rebound, no ascites, no appreciable mass Musculoskeletal: No significant cyanosis, clubbing of bilateral lower extremities, left arm in sling and tender to light palpation over humerus Neurological: Alert and oriented x 3, moves all extremities x 4 without focal neurological deficits, CN 2-12 intact  Data Reviewed: Basic Metabolic Panel:  Recent Labs Lab 05/25/12 1316 05/26/12 0525  NA 140 140  K 3.8 4.6  CL 104 106  CO2 24 18*  GLUCOSE 141* 104*  BUN 25* 26*  CREATININE 1.51* 1.40*  CALCIUM 9.0 7.9*   Liver Function Tests:  Recent Labs Lab 05/25/12 1316  AST 23  ALT 33  ALKPHOS 71  BILITOT 1.3*  PROT 7.2  ALBUMIN 3.4*   No results found for this basename: LIPASE, AMYLASE,  in the last 168 hours No results found for this basename: AMMONIA,  in the last 168 hours CBC:  Recent Labs Lab 05/25/12 0040 05/25/12 1316 05/26/12 0525  WBC 6.4 7.0 5.6  HGB 11.8* 11.6* 10.5*  HCT 33.9* 33.6* 30.5*  MCV 88.7 88.2 89.2  PLT 245 254 212   Cardiac Enzymes: No results found for this basename: CKTOTAL, CKMB, CKMBINDEX, TROPONINI,  in the last 168 hours BNP (last 3 results)  Recent Labs  02/19/12 1050  PROBNP 1951.0*   CBG:  Recent Labs Lab 05/26/12 0818 05/26/12 1151 05/26/12 1655 05/26/12 2134 05/27/12 0743  GLUCAP 101* 158* 94 119* 92    Recent Results (from the past 240 hour(s))  MRSA PCR SCREENING     Status: None   Collection Time    05/25/12  5:15 PM      Result Value Range Status   MRSA by PCR NEGATIVE  NEGATIVE Final   Comment:            The GeneXpert MRSA Assay (FDA     approved for NASAL specimens     only), is one component of a     comprehensive MRSA colonization     surveillance program. It is not     intended to diagnose MRSA     infection nor to guide or     monitor treatment for     MRSA infections.     Studies:  Recent x-ray studies have been reviewed in detail by the Attending Physician  Scheduled  Meds:  Reviewed in detail by the Attending Physician   Junious Silk, ANP [facility] Office  [phone] Pager [phone]  On-Call/Text Page:      [email or web address]      password [email or web address]  If 7PM-7AM, please contact night-coverage [email or web address] Password [email or web address] 05/27/2012, 10:29 AM   LOS: 2 days   I have examined the patient, reviewed the chart and modified the above note which I agree with.   Kenniya Westrich,MD [phone] 05/27/2012, 4:29 PM

## 2012-05-27 NOTE — Progress Notes (Signed)
Clinical Social Worker received referral for SNF.  CSW reviewed chart and noted PT/OT currently recommending Home Health.  Per chart review it appears pt is being followed by [facility].  CSW staffed case with RNCM.  CSW to sign off, please re consult if needed.     Angelia Mould, MSW, [place] [phone]

## 2012-05-27 NOTE — Progress Notes (Addendum)
ANTICOAGULATION CONSULT NOTE   Pharmacy Consult for UFH Indication: atrial fibrillation  Allergies  Allergen Reactions  . Nifedipine     REACTION: made her weak    Patient Measurements: Height: 4\' 11"  (149.9 cm) Weight: 127 lb 3.3 oz (57.7 kg) IBW/kg (Calculated) : 43.2 Heparin Dosing Weight: 56kg  Vital Signs: Temp: 98.4 F (36.9 C) (05/09 1132) Temp src: Oral (05/09 1132) BP: 121/80 mmHg (05/09 1200) Pulse Rate: 100 (05/09 1200)  Labs:  Recent Labs  05/25/12 0040 05/25/12 1316 05/26/12 0525 05/26/12 1100 05/27/12 1317  HGB 11.8* 11.6* 10.5*  --  9.9*  HCT 33.9* 33.6* 30.5*  --  30.1*  PLT 245 254 212  --  220  LABPROT  --  20.3*  --   --   --   INR  --  1.81*  --   --   --   HEPARINUNFRC 0.19*  --   --  0.36 0.58  CREATININE  --  1.51* 1.40*  --  1.16*    Estimated Creatinine Clearance: 27.9 ml/min (by C-G formula based on Cr of 1.16).  Assessment: 79 y/o female with h/o Afib CVR ont on any rate controlling meds.   Coumadin pta INR 1.8 on admit - currently  on hold.  Ortho note seems to defer surgery for now for fracture.   Heparin while Coumadin being held -drip rate 850 uts/hr HL 0.58 at goal.  No bleeding noted, Hgb with downward trend, Platelet ct stable.   Goal of Therapy:  Heparin level 0.3-0.7 units/ml Monitor platelets by anticoagulation protocol: Yes   Plan:  Continue Heparin 850 units/hr Check daily CBC, Heparin level Follow up restart Coumadin (will check INR with AM labs).  Tad Moore, BCPS  Clinical Pharmacist Pager [phone]  05/27/2012 3:09 PM

## 2012-05-27 NOTE — Progress Notes (Signed)
PT collected orthostatic measurements for pt and were documented in Epic.  Pt was unable to stand per protocol and therefore measurements were recorded for supine and sitting.  MD was made aware.

## 2012-05-28 DIAGNOSIS — I509 Heart failure, unspecified: Secondary | ICD-10-CM

## 2012-05-28 DIAGNOSIS — I5022 Chronic systolic (congestive) heart failure: Secondary | ICD-10-CM

## 2012-05-28 LAB — GLUCOSE, CAPILLARY: Glucose-Capillary: 135 mg/dL — ABNORMAL HIGH (ref 70–99)

## 2012-05-28 LAB — CBC
HCT: 30.8 % — ABNORMAL LOW (ref 36.0–46.0)
Hemoglobin: 10 g/dL — ABNORMAL LOW (ref 12.0–15.0)
MCH: 30.5 pg (ref 26.0–34.0)
MCHC: 32.5 g/dL (ref 30.0–36.0)

## 2012-05-28 LAB — BASIC METABOLIC PANEL
BUN: 14 mg/dL (ref 6–23)
CO2: 20 mEq/L (ref 19–32)
Calcium: 7.9 mg/dL — ABNORMAL LOW (ref 8.4–10.5)
Creatinine, Ser: 0.98 mg/dL (ref 0.50–1.10)
Glucose, Bld: 103 mg/dL — ABNORMAL HIGH (ref 70–99)

## 2012-05-28 LAB — HEPARIN LEVEL (UNFRACTIONATED): Heparin Unfractionated: 0.3 IU/mL (ref 0.30–0.70)

## 2012-05-28 MED ORDER — WARFARIN SODIUM 5 MG PO TABS
5.0000 mg | ORAL_TABLET | Freq: Once | ORAL | Status: AC
  Administered 2012-05-28: 5 mg via ORAL
  Filled 2012-05-28: qty 1

## 2012-05-28 MED ORDER — COLCHICINE 0.6 MG PO TABS
0.6000 mg | ORAL_TABLET | Freq: Two times a day (BID) | ORAL | Status: DC | PRN
  Filled 2012-05-28: qty 1

## 2012-05-28 MED ORDER — ENOXAPARIN SODIUM 60 MG/0.6ML ~~LOC~~ SOLN: 1.0000 mg/kg | Freq: Two times a day (BID) | SUBCUTANEOUS | Status: DC

## 2012-05-28 MED ORDER — ALLOPURINOL 100 MG PO TABS
100.0000 mg | ORAL_TABLET | Freq: Every day | ORAL | Status: DC
  Administered 2012-05-28 – 2012-05-29 (×2): 100 mg via ORAL
  Filled 2012-05-28 (×2): qty 1

## 2012-05-28 MED ORDER — WARFARIN - PHARMACIST DOSING INPATIENT: Freq: Every day | Status: DC

## 2012-05-28 MED ORDER — PINDOLOL 10 MG PO TABS
10.0000 mg | ORAL_TABLET | Freq: Two times a day (BID) | ORAL | Status: DC
  Administered 2012-05-28 – 2012-05-29 (×3): 10 mg via ORAL
  Filled 2012-05-28 (×4): qty 1

## 2012-05-28 NOTE — Progress Notes (Signed)
Pt remains in afib and is typically 85-110's, but is having very frequent events where the heart rate is 126-142.  She says that she is always a little short of breath, but denies any chest pain, or feelings of palpitations.  BPs remain stable 130's/70-90's, last 137/93.  Notified Lenny Pastel NP of same and medications have been ordered.  Will give when available to floor. Will continue to assess.

## 2012-05-28 NOTE — Progress Notes (Signed)
ANTICOAGULATION CONSULT NOTE - Follow Up Consult  Pharmacy Consult for coumadin Indication: atrial fibrillation  Allergies  Allergen Reactions  . Nifedipine     REACTION: made her weak    Patient Measurements: Height: 4\' 11"  (149.9 cm) Weight: 132 lb 15 oz (60.3 kg) IBW/kg (Calculated) : 43.2   Vital Signs: Temp: 97.9 F (36.6 C) (05/10 0416) Temp src: Oral (05/10 0416) BP: 137/93 mmHg (05/10 0636) Pulse Rate: 110 (05/10 0822)  Labs:  Recent Labs  05/25/12 1316 05/26/12 0525 05/26/12 1100 05/27/12 1317 05/28/12 0804  HGB 11.6* 10.5*  --  9.9* 10.0*  HCT 33.6* 30.5*  --  30.1* 30.8*  PLT 254 212  --  220 200  LABPROT 20.3*  --   --   --  21.3*  INR 1.81*  --   --   --  1.93*  HEPARINUNFRC  --   --  0.36 0.58 0.30  CREATININE 1.51* 1.40*  --  1.16*  --     Estimated Creatinine Clearance: 28.5 ml/min (by C-G formula based on Cr of 1.16).   Medications:  Scheduled:  . aspirin EC  81 mg Oral Daily  . atorvastatin  20 mg Oral Daily  . bimatoprost  1 drop Both Eyes QHS  . brimonidine  1 drop Both Eyes BID  . brinzolamide  1 drop Both Eyes BID  . docusate sodium  100 mg Oral BID  . enoxaparin (LOVENOX) injection  1 mg/kg Subcutaneous Q12H  . feeding supplement  237 mL Oral Q24H  . guanFACINE  2 mg Oral QHS  . multivitamin with minerals  1 tablet Oral Daily  . pilocarpine  1 drop Both Eyes QID  . pindolol  10 mg Oral BID  . polyethylene glycol  17 g Oral Daily  . [DISCONTINUED] docusate sodium  100 mg Oral BID    Assessment: 79 y/o female admitted with fracture and h/o Afib on coumadin pta. Patient is s/p heparin drip while awaiting surgery which has been deferred per Ortho, therefore to restart coumadin. INR this AM 1.93 PTA coumadin dose 5 mg daily No bleeding noted, Hgb slight downward trend, Platelet ct stable.   Goal of Therapy:  INR 2-3 Monitor platelets by anticoagulation protocol: Yes   Plan:  1. Restart coumadin 5mg  tonight x1 2.F/u AM INR 3.  Monitor s/sx of bleeidng  Dub Mikes 05/28/2012,8:52 AM

## 2012-05-28 NOTE — Progress Notes (Addendum)
[facility] Progress Note [place] TEAM 1 - Stepdown/ICU TEAM   Veronica Powers ZOX:[id number] DOB: Apr 20, 1925 DOA: 05/25/2012 PCP: Laurena Slimmer, MD  Brief narrative: 79 year old female patient with known vascular disease including previous CVA and chronic itch or fibrillation as well as chronic systolic heart failure and diabetes. Recent admission March 2014 for non-STEMI related to acute kidney injury and dehydration and atrial fibrillation with rapid ventricular response. She presented to the emergency department after experiencing left shoulder pain after falling the night before while attempting to utilize the restroom. The pain was uncontrollable at home therefore the patient presented to the emergency department. In the emergency department the patient was in quite a bit of pain and was also experiencing rapid ventricular response with her atrophic fibrillation but had no chest pain palpitations or shortness of breath. X-ray of the left upper extremity revealed a closed but displaced left humeral neck fracture. The ER physician has already notified Dr. August Saucer of need for consultation.  Assessment/Plan:   Fall at home/Left humeral neck fracture-displaced -Dr. August Saucer recommends conservative non surgical management with immobilization and ofc FU in 2 weeks -Home health PT and OT recommended and ordered- pt lives with daughter and son in law and has 24 hr supervision    Atrial fibrillation with RVR -were replacing volume to assist in rate control- will d/c IVF today -Pindolol was on hold due to low normal BPs but now resumed - titrate up as needed to further control rate -appreciate Cards eval    Chronic anticoagulation -INR not therapeutic so on IV Heparin- will change to Lovenox to prevent repeat blood draws for PTT (pt difficult stick) -management per pharmacy    Chronic systolic congestive heart failure, NYHA class 2- EF 40-45% -compensated and was actually clinically dry     Diabetes mellitus type II, controlled -diet controlled -CBG low, therefore, dc SSI - will d/c CBGs today    CKD (chronic kidney disease) stage 3, GFR 30-59 ml/min with Prerenal azotemia & Dehydration -low UOP and bladder scan OK -orthostatic (5/8): 137/88 supine/121/84 sitting/118/72 standing with associated transient increase in HR   Constipation -add LOC    HYPERTENSION -resuming Pindolol today    AORTIC STENOSIS (moderate to severe) -avoid dehydration- suspect DH may have contributed to her fall     H/O: CVA (cerebrovascular accident)/FTT (failure to thrive) in adult   DVT prophylaxis: IV heparin Code Status: DO NOT RESUSCITATE  Family Communication: Spoke with patient-daughter not at bedside Disposition Plan: can transfer to telemetry Isolation: Contact isolation for MRSA PCR positive status  Consultants: Cardiology Orthopedics  Procedures: None  Antibiotics: None  HPI/Subjective: Patient awake- has no complaints today  Objective: Blood pressure 137/93, pulse 122, temperature 97.9 F (36.6 C), temperature source Oral, resp. rate 23, height 4\' 11"  (1.499 m), weight 60.3 kg (132 lb 15 oz), SpO2 95.00%.  Intake/Output Summary (Last 24 hours) at 05/28/12 0806 Last data filed at 05/28/12 0400  Gross per 24 hour  Intake 1981.5 ml  Output    495 ml  Net 1486.5 ml     Exam: General: No acute respiratory distress Lungs: Clear to auscultation bilaterally without wheezes or crackles, 2 L Cardiovascular: Iregular rate and rhythm without gallop or rub, grade 2/6 systolic murmur  Over aortic area, atrial fibrillation, no peripheral edema or JVD, IV heparin Abdomen: Nontender, nondistended, soft, bowel sounds positive, no rebound, no ascites, no appreciable mass Musculoskeletal: No significant cyanosis, clubbing of bilateral lower extremities, left arm in sling and tender to  light palpation over humerus Neurological: Alert and oriented x 3, moves all extremities x 4  without focal neurological deficits, CN 2-12 intact  Data Reviewed: Basic Metabolic Panel:  Recent Labs Lab 05/25/12 1316 05/26/12 0525 05/27/12 1317  NA 140 140 137  K 3.8 4.6 3.8  CL 104 106 107  CO2 24 18* 20  GLUCOSE 141* 104* 127*  BUN 25* 26* 17  CREATININE 1.51* 1.40* 1.16*  CALCIUM 9.0 7.9* 7.6*   Liver Function Tests:  Recent Labs Lab 05/25/12 1316  AST 23  ALT 33  ALKPHOS 71  BILITOT 1.3*  PROT 7.2  ALBUMIN 3.4*   No results found for this basename: LIPASE, AMYLASE,  in the last 168 hours No results found for this basename: AMMONIA,  in the last 168 hours CBC:  Recent Labs Lab 05/25/12 0040 05/25/12 1316 05/26/12 0525 05/27/12 1317  WBC 6.4 7.0 5.6 4.2  HGB 11.8* 11.6* 10.5* 9.9*  HCT 33.9* 33.6* 30.5* 30.1*  MCV 88.7 88.2 89.2 92.6  PLT 245 254 212 220   Cardiac Enzymes: No results found for this basename: CKTOTAL, CKMB, CKMBINDEX, TROPONINI,  in the last 168 hours BNP (last 3 results)  Recent Labs  02/19/12 1050  PROBNP 1951.0*   CBG:  Recent Labs Lab 05/26/12 2134 05/27/12 0743 05/27/12 1135 05/27/12 1723 05/28/12 0031  GLUCAP 119* 92 142* 134* 96    Recent Results (from the past 240 hour(s))  MRSA PCR SCREENING     Status: None   Collection Time    05/25/12  5:15 PM      Result Value Range Status   MRSA by PCR NEGATIVE  NEGATIVE Final   Comment:            The GeneXpert MRSA Assay (FDA     approved for NASAL specimens     only), is one component of a     comprehensive MRSA colonization     surveillance program. It is not     intended to diagnose MRSA     infection nor to guide or     monitor treatment for     MRSA infections.     Studies:  Recent x-ray studies have been reviewed in detail by the Attending Physician  Scheduled Meds:  Reviewed in detail by the Attending Physician   Calvert Cantor, MD [phone]  On-Call/Text Page:      [email or web address]      password [email or web address]  If 7PM-7AM, please contact  night-coverage [email or web address] Password [email or web address] 05/28/2012, 8:06 AM   LOS: 3 days

## 2012-05-29 DIAGNOSIS — E860 Dehydration: Secondary | ICD-10-CM

## 2012-05-29 LAB — GLUCOSE, CAPILLARY: Glucose-Capillary: 121 mg/dL — ABNORMAL HIGH (ref 70–99)

## 2012-05-29 MED ORDER — WARFARIN SODIUM 5 MG PO TABS
5.0000 mg | ORAL_TABLET | Freq: Once | ORAL | Status: AC
  Administered 2012-05-29: 5 mg via ORAL
  Filled 2012-05-29: qty 1

## 2012-05-29 NOTE — Progress Notes (Signed)
Report from lab 3 attempts to collect and unable, MD made aware

## 2012-05-29 NOTE — Discharge Summary (Signed)
Physician Discharge Summary  Veronica Powers ZOX:[id number] DOB: [birth year] DOA: 05/25/2012  PCP: Laurena Slimmer, MD  Admit date: 05/25/2012 Discharge date: 05/29/2012  Time spent: >45 minutes  Recommendations for Outpatient Follow-up:  1. Resume Hydralazine if BP becomes elevated  Discharge Diagnoses:  Principal Problem:   Left humeral neck fracture-displaced Active Problems:   HYPERTENSION, HEART CONTROLLED W/O ASSOC CHF   AORTIC STENOSIS   Atrial fibrillation with RVR   Chronic anticoagulation   Chronic systolic congestive heart failure, NYHA class 2   H/O: CVA (cerebrovascular accident)   Diabetes mellitus type II, controlled   FTT (failure to thrive) in adult   CKD (chronic kidney disease) stage 3, GFR 30-59 ml/min   Prerenal azotemia   Dehydration   Fall at home   Discharge Condition: stable  Diet recommendation: heart healthy, diabetic diet  Filed Weights   05/25/12 1655 05/27/12 0400 05/28/12 0033  Weight: 50.9 kg (112 lb 3.4 oz) 57.7 kg (127 lb 3.3 oz) 60.3 kg (132 lb 15 oz)    History of present illness (43/34):  79 year old female patient with known vascular disease including previous CVA and chronic itch or fibrillation as well as chronic systolic heart failure and diabetes. Recent admission March 2014 for non-STEMI related to acute kidney injury and dehydration and atrial fibrillation with rapid ventricular response. She presented to the emergency department after experiencing left shoulder pain after falling the night before while attempting to utilize the restroom. The pain was uncontrollable at home therefore the patient presented to the emergency department. In the emergency department the patient was in quite a bit of pain and was also experiencing rapid ventricular response with her atrial fibrillation but had no chest pain palpitations or shortness of breath. X-ray of the left upper extremity revealed a closed but displaced left humeral neck fracture.    Hospital  Course:  Fall at home/Left humeral neck fracture-displaced  -Dr. August Saucer recommends conservative non surgical management with immobilization and ofc FU in 2 weeks  -Home health PT and OT recommended and ordered- pt lives with daughter and son in law and has 24 hr supervision   Atrial fibrillation with RVR  -were replacing volume to assist in rate control-  -Pindolol was on hold due to low normal BPs but now resumed  -was evaluated by Cardiology as well  Chronic anticoagulation  -cont Coumadin  Chronic systolic congestive heart failure, NYHA class 2- EF 40-45%  -compensated and was actually clinically dry   Diabetes mellitus type II, controlled  -diet controlled  CKD (chronic kidney disease) stage 3, GFR 30-59 ml/min with Prerenal azotemia & Dehydration  -low UOP and bladder scan OK  -orthostatic (5/8): 137/88 supine/121/84 sitting/118/72 standing with associated transient increase in HR  - fluid replaced  Constipation  -resolved with treatment  HYPERTENSION  -BP stable- actually low normal today but pt asymptomatic - hold Hydralazine for now  AORTIC STENOSIS (moderate to severe)  -avoid dehydration- suspect DH may have contributed to her fall   H/O: CVA (cerebrovascular accident)/FTT (failure to thrive) in adult    Consultations:  Cardio  ortho  Discharge Exam: Filed Vitals:   05/28/12 2100 05/29/12 0500 05/29/12 1045 05/29/12 1400  BP: 123/94 131/90 129/60 102/57  Pulse: 95 70 93 87  Temp: 98.4 F (36.9 C) 97.6 F (36.4 C)  97.3 F (36.3 C)  TempSrc:    Oral  Resp: 22 20  20   Height:      Weight:      SpO2: 100% 100%  95%    General: AAO x3, no distress Cardiovascular: IIRR, 2/6 murmurs Respiratory: CTA b/l   Discharge Instructions  Discharge Orders   Future Appointments Provider Department Dept Phone   07/11/2012 1:45 PM Duke Salvia, MD [facility] Main Office [place]) [phone]   Future Orders Complete By Expires     Diet - low sodium  heart healthy  As directed     Increase activity slowly  As directed         Medication List    STOP taking these medications       hydrALAZINE 10 MG tablet  Commonly known as:  APRESOLINE      TAKE these medications       allopurinol 100 MG tablet  Commonly known as:  ZYLOPRIM  Take 100 mg by mouth daily.     aspirin 81 MG EC tablet  Take 1 tablet (81 mg total) by mouth daily.     atorvastatin 20 MG tablet  Commonly known as:  LIPITOR  Take 20 mg by mouth daily.     bimatoprost 0.03 % ophthalmic solution  Commonly known as:  LUMIGAN  Place 1 drop into both eyes at bedtime.     brimonidine 0.15 % ophthalmic solution  Commonly known as:  ALPHAGAN  Place 1 drop into both eyes 2 (two) times daily.     brinzolamide 1 % ophthalmic suspension  Commonly known as:  AZOPT  Place 1 drop into both eyes 2 (two) times daily.     COLCRYS 0.6 MG tablet  Generic drug:  colchicine  Take 0.6 mg by mouth 2 (two) times daily. prn     DSS 100 MG Caps  Take 100 mg by mouth 2 (two) times daily.     fluticasone 50 MCG/ACT nasal spray  Commonly known as:  FLONASE  Place 2 sprays into the nose daily as needed for allergies.     guaiFENesin 600 MG 12 hr tablet  Commonly known as:  MUCINEX  Take 1,200 mg by mouth daily as needed for congestion.     guanFACINE 2 MG tablet  Commonly known as:  TENEX  Take 2 mg by mouth at bedtime.     multivitamin with minerals Tabs  Take 1 tablet by mouth daily.     oxybutynin 10 MG 24 hr tablet  Commonly known as:  DITROPAN-XL  Take 10 mg by mouth daily as needed. For bladder control     pilocarpine 4 % ophthalmic solution  Commonly known as:  PILOCAR  Place 1 drop into both eyes 4 (four) times daily.     pindolol 10 MG tablet  Commonly known as:  VISKEN  Take 1 tablet (10 mg total) by mouth 2 (two) times daily.     polyethylene glycol packet  Commonly known as:  MIRALAX / GLYCOLAX  Take 17 g by mouth daily as needed (for constipation).      REFRESH PLUS 0.5 % Soln  Generic drug:  carboxymethylcellulose  Place 1 drop into both eyes as needed.     warfarin 5 MG tablet  Commonly known as:  COUMADIN  Take 5 mg by mouth daily.       Allergies  Allergen Reactions  . Nifedipine     REACTION: made her weak       Follow-up Information   Follow up with Cammy Copa, MD. Schedule an appointment as soon as possible for a visit in 2 weeks.   Contact information:   [address] [place] [address] [phone]        The results of significant diagnostics from this hospitalization (including imaging, microbiology, ancillary and laboratory) are listed below for reference.    Significant Diagnostic Studies: Dg Shoulder Left  05/25/2012  *RADIOLOGY REPORT*  Clinical Data: Fall  LEFT SHOULDER - 2+ VIEW  Comparison: None.  Findings: Displaced fracture of the humeral neck with lateral displacement.  No other fracture.  Glenohumeral joint is normal.  Mild AC separation on the left which could be chronic.  IMPRESSION: Displaced fracture of left humeral neck.   Original Report Authenticated By: Janeece Riggers, M.D.    Dg Humerus Left  05/25/2012  *RADIOLOGY REPORT*  Clinical Data: Fall  LEFT HUMERUS - 2+ VIEW  Comparison: None.  Findings: Transverse fracture through the humeral neck with displacement laterally.  No other fracture of the humerus.  IMPRESSION: Displaced fracture of the neck of the humerus.   Original Report Authenticated By: Janeece Riggers, M.D.     Microbiology: Recent Results (from the past 240 hour(s))  MRSA PCR SCREENING     Status: None   Collection Time    05/25/12  5:15 PM      Result Value Range Status   MRSA by PCR NEGATIVE  NEGATIVE Final   Comment:            The GeneXpert MRSA Assay (FDA     approved for NASAL specimens     only), is one component of a     comprehensive MRSA colonization     surveillance program. It is not     intended to diagnose MRSA     infection nor to guide or      monitor treatment for     MRSA infections.     Labs: Basic Metabolic Panel:  Recent Labs Lab 05/25/12 1316 05/26/12 0525 05/27/12 1317 05/28/12 0804  NA 140 140 137 137  K 3.8 4.6 3.8 3.8  CL 104 106 107 108  CO2 24 18* 20 20  GLUCOSE 141* 104* 127* 103*  BUN 25* 26* 17 14  CREATININE 1.51* 1.40* 1.16* 0.98  CALCIUM 9.0 7.9* 7.6* 7.9*   Liver Function Tests:  Recent Labs Lab 05/25/12 1316  AST 23  ALT 33  ALKPHOS 71  BILITOT 1.3*  PROT 7.2  ALBUMIN 3.4*   No results found for this basename: LIPASE, AMYLASE,  in the last 168 hours No results found for this basename: AMMONIA,  in the last 168 hours CBC:  Recent Labs Lab 05/25/12 0040 05/25/12 1316 05/26/12 0525 05/27/12 1317 05/28/12 0804  WBC 6.4 7.0 5.6 4.2 4.8  HGB 11.8* 11.6* 10.5* 9.9* 10.0*  HCT 33.9* 33.6* 30.5* 30.1* 30.8*  MCV 88.7 88.2 89.2 92.6 93.9  PLT 245 254 212 220 200   Cardiac Enzymes: No results found for this basename: CKTOTAL, CKMB, CKMBINDEX, TROPONINI,  in the last 168 hours BNP: BNP (last 3 results)  Recent Labs  02/19/12 1050  PROBNP 1951.0*   CBG:  Recent Labs Lab 05/28/12 1710 05/28/12 2053 05/29/12 0757 05/29/12 1206 05/29/12 1701  GLUCAP 109* 113* 101* 78 121*       SignedCalvert Cantor, MD  [facility] 05/29/2012, 5:30 PM

## 2012-05-29 NOTE — Progress Notes (Signed)
ANTICOAGULATION CONSULT NOTE - Follow Up Consult  Pharmacy Consult for coumadin Indication: atrial fibrillation  Allergies  Allergen Reactions  . Nifedipine     REACTION: made her weak    Patient Measurements: Height: 4\' 11"  (149.9 cm) Weight: 132 lb 15 oz (60.3 kg) IBW/kg (Calculated) : 43.2   Vital Signs: Temp: 97.6 F (36.4 C) (05/11 0500) BP: 129/60 mmHg (05/11 1045) Pulse Rate: 93 (05/11 1045)  Labs:  Recent Labs  05/27/12 1317 05/28/12 0804  HGB 9.9* 10.0*  HCT 30.1* 30.8*  PLT 220 200  LABPROT  --  21.3*  INR  --  1.93*  HEPARINUNFRC 0.58 0.30  CREATININE 1.16* 0.98    Estimated Creatinine Clearance: 33.7 ml/min (by C-G formula based on Cr of 0.98).   Assessment: Patient is an 79 y.o F on coumadin PTA for afib.  Patient is a hardstick-- Lab personal not able to obtain blood samples this morning.  INR was 1.93 yesterday with coumadin 5mg  given last night.  Goal of Therapy:  INR 2-3    Plan:  1) repeat coumadin 5mg  PO x1 today  Veronica Powers P 05/29/2012,1:55 PM

## 2012-05-30 ENCOUNTER — Emergency Department (HOSPITAL_COMMUNITY)
Admission: EM | Admit: 2012-05-30 | Discharge: 2012-06-19 | Disposition: E | Payer: PRIVATE HEALTH INSURANCE | Attending: Emergency Medicine | Admitting: Emergency Medicine

## 2012-05-30 ENCOUNTER — Emergency Department (HOSPITAL_COMMUNITY): Payer: PRIVATE HEALTH INSURANCE

## 2012-05-30 ENCOUNTER — Encounter (HOSPITAL_COMMUNITY): Payer: Self-pay | Admitting: Radiology

## 2012-05-30 DIAGNOSIS — E119 Type 2 diabetes mellitus without complications: Secondary | ICD-10-CM | POA: Insufficient documentation

## 2012-05-30 DIAGNOSIS — Z7901 Long term (current) use of anticoagulants: Secondary | ICD-10-CM | POA: Insufficient documentation

## 2012-05-30 DIAGNOSIS — Z8679 Personal history of other diseases of the circulatory system: Secondary | ICD-10-CM | POA: Insufficient documentation

## 2012-05-30 DIAGNOSIS — Z79899 Other long term (current) drug therapy: Secondary | ICD-10-CM | POA: Insufficient documentation

## 2012-05-30 DIAGNOSIS — IMO0002 Reserved for concepts with insufficient information to code with codable children: Secondary | ICD-10-CM | POA: Insufficient documentation

## 2012-05-30 DIAGNOSIS — I10 Essential (primary) hypertension: Secondary | ICD-10-CM | POA: Insufficient documentation

## 2012-05-30 DIAGNOSIS — Z7982 Long term (current) use of aspirin: Secondary | ICD-10-CM | POA: Insufficient documentation

## 2012-05-30 DIAGNOSIS — I469 Cardiac arrest, cause unspecified: Secondary | ICD-10-CM | POA: Insufficient documentation

## 2012-05-30 DIAGNOSIS — M109 Gout, unspecified: Secondary | ICD-10-CM | POA: Insufficient documentation

## 2012-05-30 DIAGNOSIS — K219 Gastro-esophageal reflux disease without esophagitis: Secondary | ICD-10-CM | POA: Insufficient documentation

## 2012-05-30 DIAGNOSIS — I4891 Unspecified atrial fibrillation: Secondary | ICD-10-CM | POA: Insufficient documentation

## 2012-05-30 DIAGNOSIS — I5022 Chronic systolic (congestive) heart failure: Secondary | ICD-10-CM | POA: Insufficient documentation

## 2012-05-30 DIAGNOSIS — Z8673 Personal history of transient ischemic attack (TIA), and cerebral infarction without residual deficits: Secondary | ICD-10-CM | POA: Insufficient documentation

## 2012-05-30 LAB — URINALYSIS, ROUTINE W REFLEX MICROSCOPIC
Nitrite: NEGATIVE
Protein, ur: 100 mg/dL — AB
Urobilinogen, UA: 0.2 mg/dL (ref 0.0–1.0)

## 2012-05-30 LAB — URINE MICROSCOPIC-ADD ON

## 2012-05-30 MED ORDER — EPINEPHRINE HCL 0.1 MG/ML IJ SOSY
0.1000 mg | PREFILLED_SYRINGE | Freq: Once | INTRAMUSCULAR | Status: AC
  Administered 2012-05-30: 0.1 mg via INTRAVENOUS

## 2012-05-30 MED ORDER — LIDOCAINE HCL (CARDIAC) 20 MG/ML IV SOLN: 100.0000 mg | Freq: Once | INTRAVENOUS | Status: DC

## 2012-05-30 MED ORDER — SUCCINYLCHOLINE CHLORIDE 20 MG/ML IJ SOLN
INTRAMUSCULAR | Status: AC
  Filled 2012-05-30: qty 1

## 2012-05-30 MED ORDER — ROCURONIUM BROMIDE 50 MG/5ML IV SOLN
INTRAVENOUS | Status: AC
  Filled 2012-05-30: qty 2

## 2012-05-30 MED ORDER — EPINEPHRINE HCL 1 MG/ML IJ SOLN: 1.0000 mg | Freq: Once | INTRAMUSCULAR | Status: DC

## 2012-05-30 MED ORDER — ETOMIDATE 2 MG/ML IV SOLN
INTRAVENOUS | Status: AC
  Filled 2012-05-30: qty 20

## 2012-05-30 MED ORDER — ETOMIDATE 2 MG/ML IV SOLN: 20.0000 mg | Freq: Once | INTRAVENOUS | Status: DC

## 2012-05-30 MED ORDER — ATROPINE SULFATE 1 MG/ML IJ SOLN
1.0000 mg | Freq: Once | INTRAMUSCULAR | Status: AC
  Administered 2012-05-30: 1 mg via INTRAVENOUS

## 2012-05-30 MED ORDER — LIDOCAINE HCL (CARDIAC) 20 MG/ML IV SOLN
INTRAVENOUS | Status: AC
  Filled 2012-05-30: qty 5

## 2012-05-30 MED ORDER — SUCCINYLCHOLINE CHLORIDE 20 MG/ML IJ SOLN: 100.0000 mg | Freq: Once | INTRAMUSCULAR | Status: DC

## 2012-05-30 MED FILL — Medication: Qty: 1 | Status: AC

## 2012-06-19 NOTE — ED Notes (Signed)
Family at bedside. Family requested to go to family room at this time

## 2012-06-19 NOTE — ED Notes (Signed)
MD at bedside. 

## 2012-06-19 NOTE — ED Notes (Signed)
At 12:40 21 at lips 7.5

## 2012-06-19 NOTE — ED Provider Notes (Addendum)
History     CSN: [id number]  Arrival date & time 06/13/12  1217   First MD Initiated Contact with Patient 06-13-2012 1250      No chief complaint on file.  level 5 caveat patient less responsive, dyspneic. History is obtained from EMS and from family who accompany patient  (Consider location/radiation/quality/duration/timing/severity/associated sxs/prior treatment) HPI Patient was doing well until possibly one hour prior to arrival when she was less responsive. Patient was discharged from the hospital yesterday for treatment of atrial fibrillation and pain control for a fractured left humerus. Her daughter reports that she a good night last night at approximately one hour prior to arrival she became less responsive and dyspneic. EMS noted to be blood glucose of less than 10 treated patient with intraosseous line. D. 10 and Narcan 2 mg intravenously without improvement of mental status. Past Medical History  Diagnosis Date  . Atrial fibrillation     a. permanent - on coumadin.  . Chronic systolic CHF (congestive heart failure)     a. 02/20/2012 Echo: EF 40-50%, diff HK, mod to sev AS, mild AI (valve area 1.09cm2 Vmax), mild MR, mod to sev dil LA, mildl dil RV.  Marland Kitchen Aortic stenosis, moderate     a. 02/2012 Echo:  valve area 1.09cm2 Vmax  . Diabetes     type 2  . Stroke     left sided residual  . Hypertension   . Gout   . GERD (gastroesophageal reflux disease)   . Bradycardia     a. noted to limit tx of afib - concern for possible ppm need.    Past Surgical History  Procedure Laterality Date  . Cholecystectomy    . Cesarean section      Family History  Problem Relation Age of Onset  . Coronary artery disease    . Cancer    . Coronary artery disease Mother   . Cancer Father     History  Substance Use Topics  . Smoking status: Never Smoker   . Smokeless tobacco: Never Used  . Alcohol Use: No    OB History   Grav Para Term Preterm Abortions TAB SAB Ect Mult Living                   Review of Systems  Unable to perform ROS: Mental status change    Allergies  Nifedipine  Home Medications   Current Outpatient Rx  Name  Route  Sig  Dispense  Refill  . allopurinol (ZYLOPRIM) 100 MG tablet   Oral   Take 100 mg by mouth daily.          Marland Kitchen aspirin EC 81 MG EC tablet   Oral   Take 1 tablet (81 mg total) by mouth daily.   30 tablet   3   . atorvastatin (LIPITOR) 20 MG tablet   Oral   Take 20 mg by mouth daily.           . bimatoprost (LUMIGAN) 0.03 % ophthalmic solution   Both Eyes   Place 1 drop into both eyes at bedtime.           . brimonidine (ALPHAGAN) 0.15 % ophthalmic solution   Both Eyes   Place 1 drop into both eyes 2 (two) times daily.           . brinzolamide (AZOPT) 1 % ophthalmic suspension   Both Eyes   Place 1 drop into both eyes 2 (two) times daily.           Marland Kitchen  carboxymethylcellulose (REFRESH PLUS) 0.5 % SOLN   Both Eyes   Place 1 drop into both eyes as needed.           . COLCRYS 0.6 MG tablet   Oral   Take 0.6 mg by mouth 2 (two) times daily. prn         . docusate sodium 100 MG CAPS   Oral   Take 100 mg by mouth 2 (two) times daily.   60 capsule   0   . fluticasone (FLONASE) 50 MCG/ACT nasal spray   Nasal   Place 2 sprays into the nose daily as needed for allergies.          Marland Kitchen guaiFENesin (MUCINEX) 600 MG 12 hr tablet   Oral   Take 1,200 mg by mouth daily as needed for congestion.         Marland Kitchen guanFACINE (TENEX) 2 MG tablet   Oral   Take 2 mg by mouth at bedtime.         . Multiple Vitamin (MULTIVITAMIN WITH MINERALS) TABS   Oral   Take 1 tablet by mouth daily.         Marland Kitchen oxybutynin (DITROPAN-XL) 10 MG 24 hr tablet   Oral   Take 10 mg by mouth daily as needed. For bladder control         . pilocarpine (PILOCAR) 4 % ophthalmic solution   Both Eyes   Place 1 drop into both eyes 4 (four) times daily.           . pindolol (VISKEN) 10 MG tablet   Oral   Take 1 tablet (10 mg total)  by mouth 2 (two) times daily.   60 tablet   5   . polyethylene glycol (MIRALAX / GLYCOLAX) packet   Oral   Take 17 g by mouth daily as needed (for constipation).   14 each   0   . warfarin (COUMADIN) 5 MG tablet   Oral   Take 5 mg by mouth daily.            BP 112/68  Pulse 43  Temp(Src) 99.4 F (37.4 C) (Rectal)  Resp 42  Ht 6\' 5"  (1.956 m)  SpO2 100%  Physical Exam  Nursing note and vitals reviewed. Constitutional:  Ill appearing unable to speak, respiratory distress  HENT:  Head: Normocephalic and atraumatic.  Mucous membranes pale  Eyes: Conjunctivae are normal. Pupils are equal, round, and reactive to light.  Neck: Neck supple. No tracheal deviation present. No thyromegaly present.  Cardiovascular: Normal rate and regular rhythm.   No murmur heard. Pulmonary/Chest: Effort normal.   respiratory distress shallow respirations  Abdominal: Soft. Bowel sounds are normal. She exhibits no distension. There is no tenderness.  Obese  Musculoskeletal: Normal range of motion.  Moves all extremities nonpurposeful movement to noxious stimulus with deformity at left upper extremity  Neurological: Coordination normal.  Gag reflex intact moves all extremities  Skin: Skin is warm and dry. No rash noted.  Psychiatric:  Unobtainable    ED Course  Procedures (including critical care time)  Labs Reviewed  URINALYSIS, ROUTINE W REFLEX MICROSCOPIC  BLOOD GAS, ARTERIAL  URINALYSIS, ROUTINE W REFLEX MICROSCOPIC   No results found.   Nursing unable to establish peripheral iv access Angiocath insertion Performed by: Doug Sou  Consent: Verbal consent obtained. Risks and benefits: risks, benefits and alternatives were discussed Time out: Immediately prior to procedure a "time out" was called to verify the correct patient, procedure,  equipment, support staff and site/side marked as required.  Preparation: Patient was prepped and draped in the usual sterile  fashion.  Vein Location: left external jugular    Gauge: 20  Normal blood return and flush without difficulty Patient tolerance: Patient tolerated the procedure well with no immediate complications.   Date: 06-01-12  Rate: 45  Rhythm: Junctional bradycardia  QRS Axis: left  Intervals: normal  ST/T Wave abnormalities: nonspecific ST/T changes  Conduction Disutrbances:nonspecific intraventricular conduction delay  Narrative Interpretation:   Old EKG Reviewed: Tracing from 05/25/2012 showed atrial fibrillation rate of 110 beats per minute interpreted by me   No diagnosis found. Intubation performed at 12:40 PM INTUBATION Performed by: Doug Sou  Required items: required blood products, implants, devices, and special equipment available Patient identity confirmed: provided demographic data and hospital-assigned identification number Time out: Immediately prior to procedure a "time out" was called to verify the correct patient, procedure, equipment, support staff and site/side marked as required.  Indications: Respiratory failure   Intubation method: Direct Laryngoscopy   Preoxygenation: 100% oxygen BVM  Sedatives: 20 mgEtomidate Paralytic: 100 mgSuccinylcholine  lidocaine 100 mg  Tube Size: 7.5 cuffed  Post-procedure assessment: chest rise and ETCO2 monitor Breath sounds: equal and absent over the epigastrium Tube secured with: ETT holder .   Patient tolerated the procedure well with no immediate complications.   I had a lengthy discussion with the patient's family members including several of her daughters after she was intubated and I made my way to the family room. The patient was to be a DO NOT RESUSCITATE CODE STATUS. I discussed at length that we could extubate the patient however they wished to leave her on the ventilator for the present time. At 1 PM patient was noted to be bradycardic without pulses. Cardiac activity was checked with ultrasound. There  was no cardiac activity. CPR was begun briefly she was administered one amp of epinephrine and one amp of atropine intravenously. Family was initially brought back to the examining room in question again whether or not resuscitation was requested. Family decided that they and patient did not wish further resuscitation. All efforts were stopped and patient was pronounced dead at 1302 p.m. in the family's presence. MDM   I spoke with Dr. Audrie Lia who knows the patient has long-standing history of cardiovascular disease . He will sign death certificate. Diagnosis death in the emergency department     CRITICAL CARE Performed by: Doug Sou Total critical care time:  Critical care time was exclusive of separately billable procedures and treating other patients. Critical care was necessary to treat or prevent imminent or life-threatening deterioration. Critical care was time spent personally by me on the following activities: development of treatment plan with patient and/or surrogate as well as nursing, discussions with consultants, evaluation of patient's response to treatment, examination of patient, obtaining history from patient or surrogate, ordering and performing treatments and interventions, ordering and review of laboratory studies, ordering and review of radiographic studies, pulse oximetry and re-evaluation of patient's condition.   Doug Sou, MD 06-01-12 1341  Doug Sou, MD 06/01/2012 1404  Doug Sou, MD 06/01/12 1430

## 2012-06-19 NOTE — Progress Notes (Signed)
Responded to page to provide emotional and spiritual support to  Ms. Silva's family already in Consultation room C.  Pt. reported to ED non responsive and passed away shortly thereafter. Escorted two daughters and 2 grandchildren to bedside. One daughter is a Producer, television/film/video. Provided grief support and prayer to family upon death of pt. Promoted information sharing between staff and family. Will continue to support staff in the days ahead.   2012-06-05 1300  Clinical Encounter Type  Visited With Family;Health care provider  Visit Type Spiritual support;Death;Trauma  Referral From Nurse  Spiritual Encounters  Spiritual Needs Prayer;Emotional;Grief support  Stress Factors  Patient Stress Factors Loss;Major life changes

## 2012-06-19 NOTE — Progress Notes (Signed)
   CARE MANAGEMENT NOTE 2012-06-13  Patient:  MACAILA, TAHIR   Account Number:  [id number]  Date Initiated:  05/27/2012  Documentation initiated by:  Alvira Philips Assessment:   79 yr-old female adm with (L) humeral neck fx; lives with dtr, has w/c, active with [facility]     Action/Plan:   Anticipated DC Date:  05/31/2012   Anticipated DC Plan:  HOME W HOME HEALTH SERVICES      DC Planning Services  CM consult      [facility] Choice  HOME HEALTH   Choice offered to / List presented to:  C-1 Patient        HH arranged  HH-2 PT  HH-3 OT      [facility] agency  [facility]   Status of service:  Completed, signed off Medicare Important Message given?   (If response is "NO", the following Medicare IM given date fields will be blank) Date Medicare IM given:   Date Additional Medicare IM given:    Discharge Disposition:  HOME W HOME HEALTH SERVICES  Per UR Regulation:    If discussed at Long Length of Stay Meetings, dates discussed:    Comments:  2012-06-13 0945 NCM notified [facility] of pt's dc home. Isidoro Donning RN CCM Case Mgmt phone [phone]  Contact:  Irish Lack, dtr # [phone]

## 2012-06-19 NOTE — ED Notes (Signed)
Og 16 french

## 2012-06-19 NOTE — ED Notes (Signed)
Per family altered mental X 1 hr with 1 episode of vomitting. Pt agonal upon ems arrival  cbg initial cbg -  10. 1 amp d10 given 3rd cbg 137 . 2mg  narcan. p thas a broken left arm. Nasal trumpet in place. IO with 50 og lidocaine given.

## 2012-06-19 NOTE — ED Notes (Addendum)
EDP at bed side Pulse check with [place] confirmed no cardiac activity. Confirmation with 2 ECG strips.

## 2012-06-19 NOTE — ED Notes (Signed)
Pt extubated at this time per ED MD verbal order

## 2012-06-19 NOTE — ED Notes (Signed)
Vent settings -  fio2  100 peep 5 rate 16.

## 2012-06-19 DEATH — deceased

## 2012-06-21 ENCOUNTER — Ambulatory Visit: Payer: Self-pay | Admitting: Internal Medicine

## 2012-06-21 DIAGNOSIS — I4891 Unspecified atrial fibrillation: Secondary | ICD-10-CM

## 2012-06-21 DIAGNOSIS — Z7901 Long term (current) use of anticoagulants: Secondary | ICD-10-CM

## 2012-07-11 ENCOUNTER — Ambulatory Visit: Payer: PRIVATE HEALTH INSURANCE | Admitting: Internal Medicine

## 2013-06-02 IMAGING — CR DG CHEST 2V
2 series · 2 of 2 positions shown · non-contrast
Comparison: 02/19/2012

CLINICAL DATA: Near syncope

CHEST - 2 VIEW

[w chest lat]
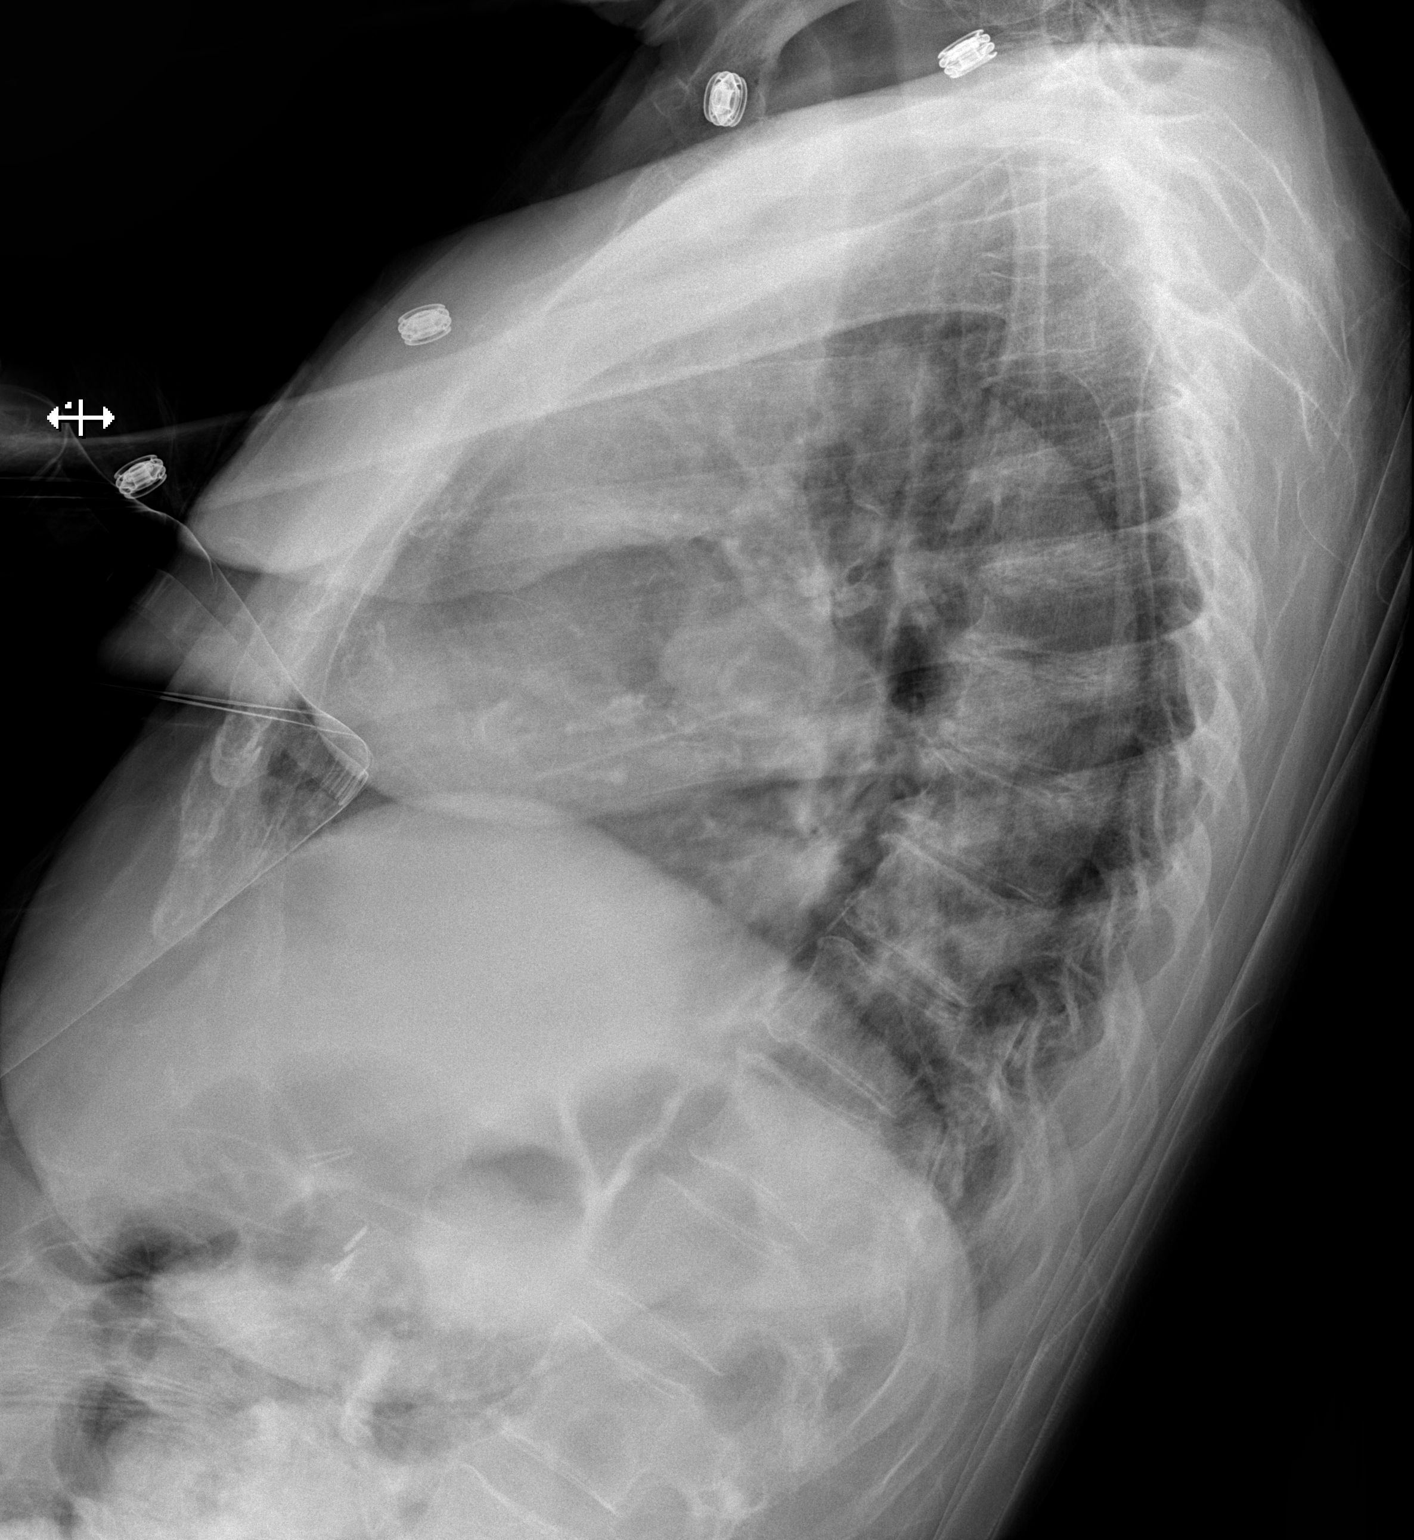

[x chest ap]
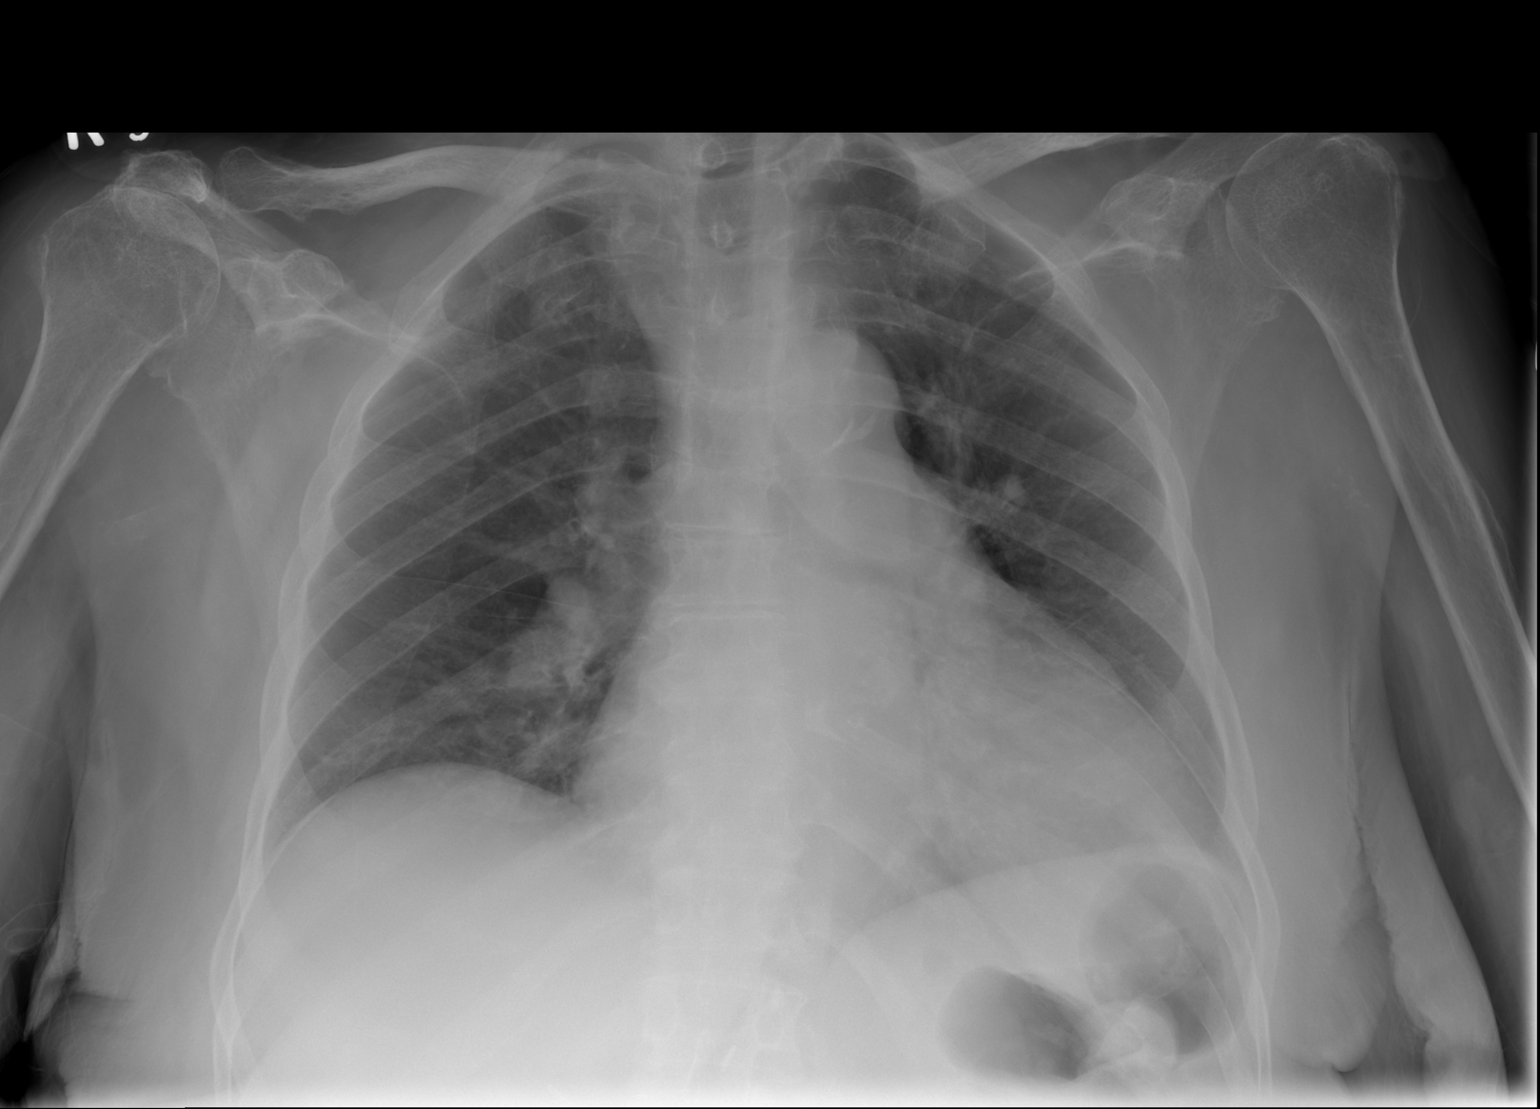

[2 of 2 positions shown; findings below may reference images not displayed]

FINDINGS: Mild cardiac enlargement is stable.  Decreased lung
volumes.  No pleural effusion or edema.  No airspace consolidation
identified.
IMPRESSION: 1.  No acute cardiopulmonary abnormalities

## 2013-07-29 IMAGING — CR DG SHOULDER 2+V*L*
2 series · 2 of 2 positions shown · non-contrast
Comparison: None.

CLINICAL DATA: Fall

LEFT SHOULDER - 2+ VIEW

[x shoulder ap left (1 of 2)]
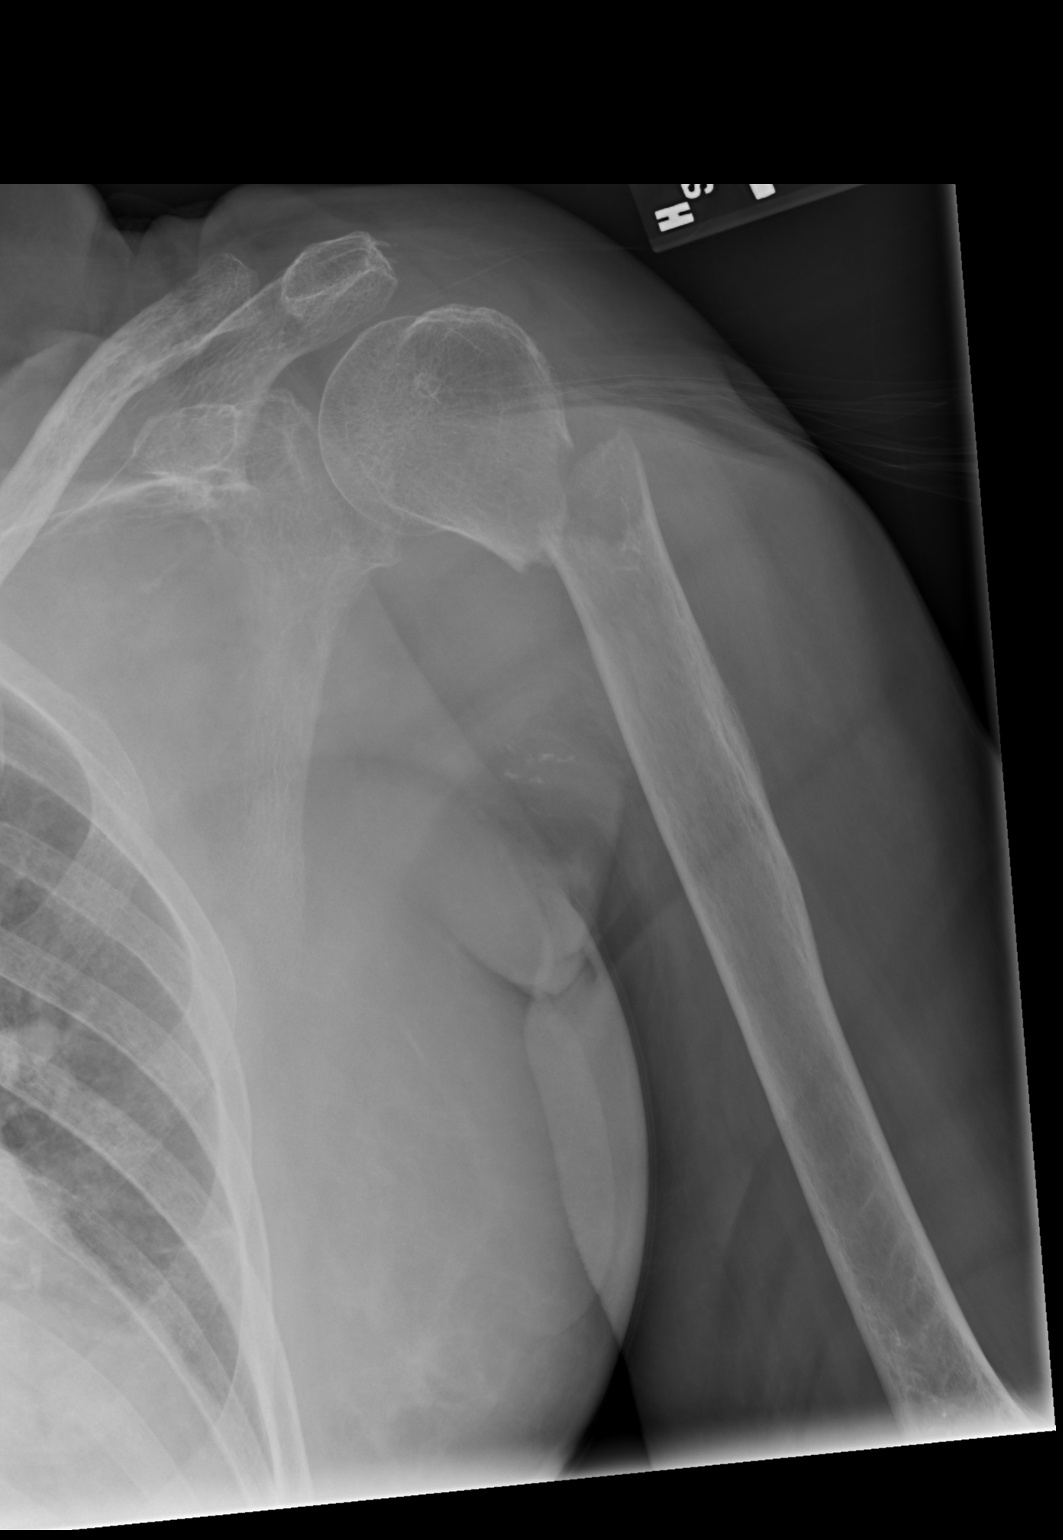

[x shoulder ap left (2 of 2)]
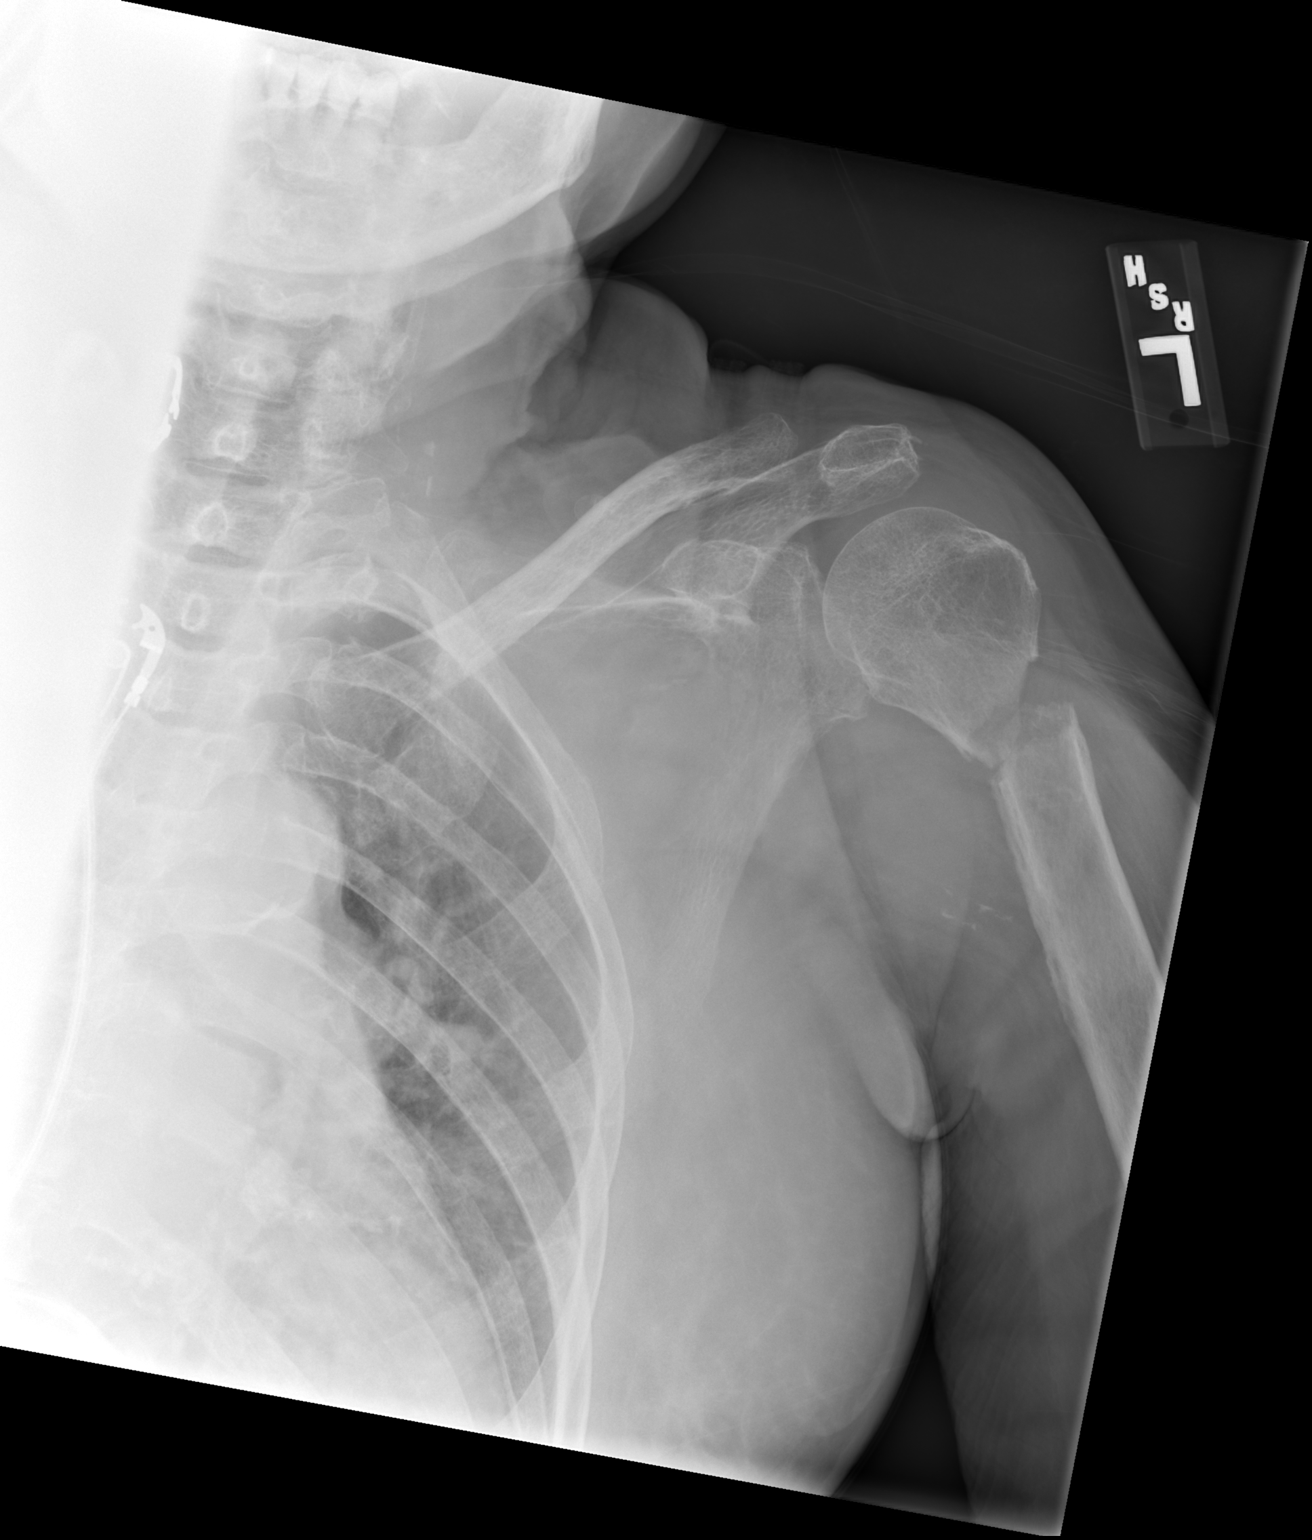

[2 of 2 positions shown; findings below may reference images not displayed]

FINDINGS: Displaced fracture of the humeral neck with lateral
displacement.  No other fracture.

Glenohumeral joint is normal.

Mild AC separation on the left which could be chronic.
IMPRESSION: Displaced fracture of left humeral neck.

## 2013-07-29 IMAGING — CR DG HUMERUS 2V *L*
1 series · 1 of 1 positions shown · non-contrast
Comparison: None.

CLINICAL DATA: Fall

LEFT HUMERUS - 2+ VIEW

[x humerus ap left]
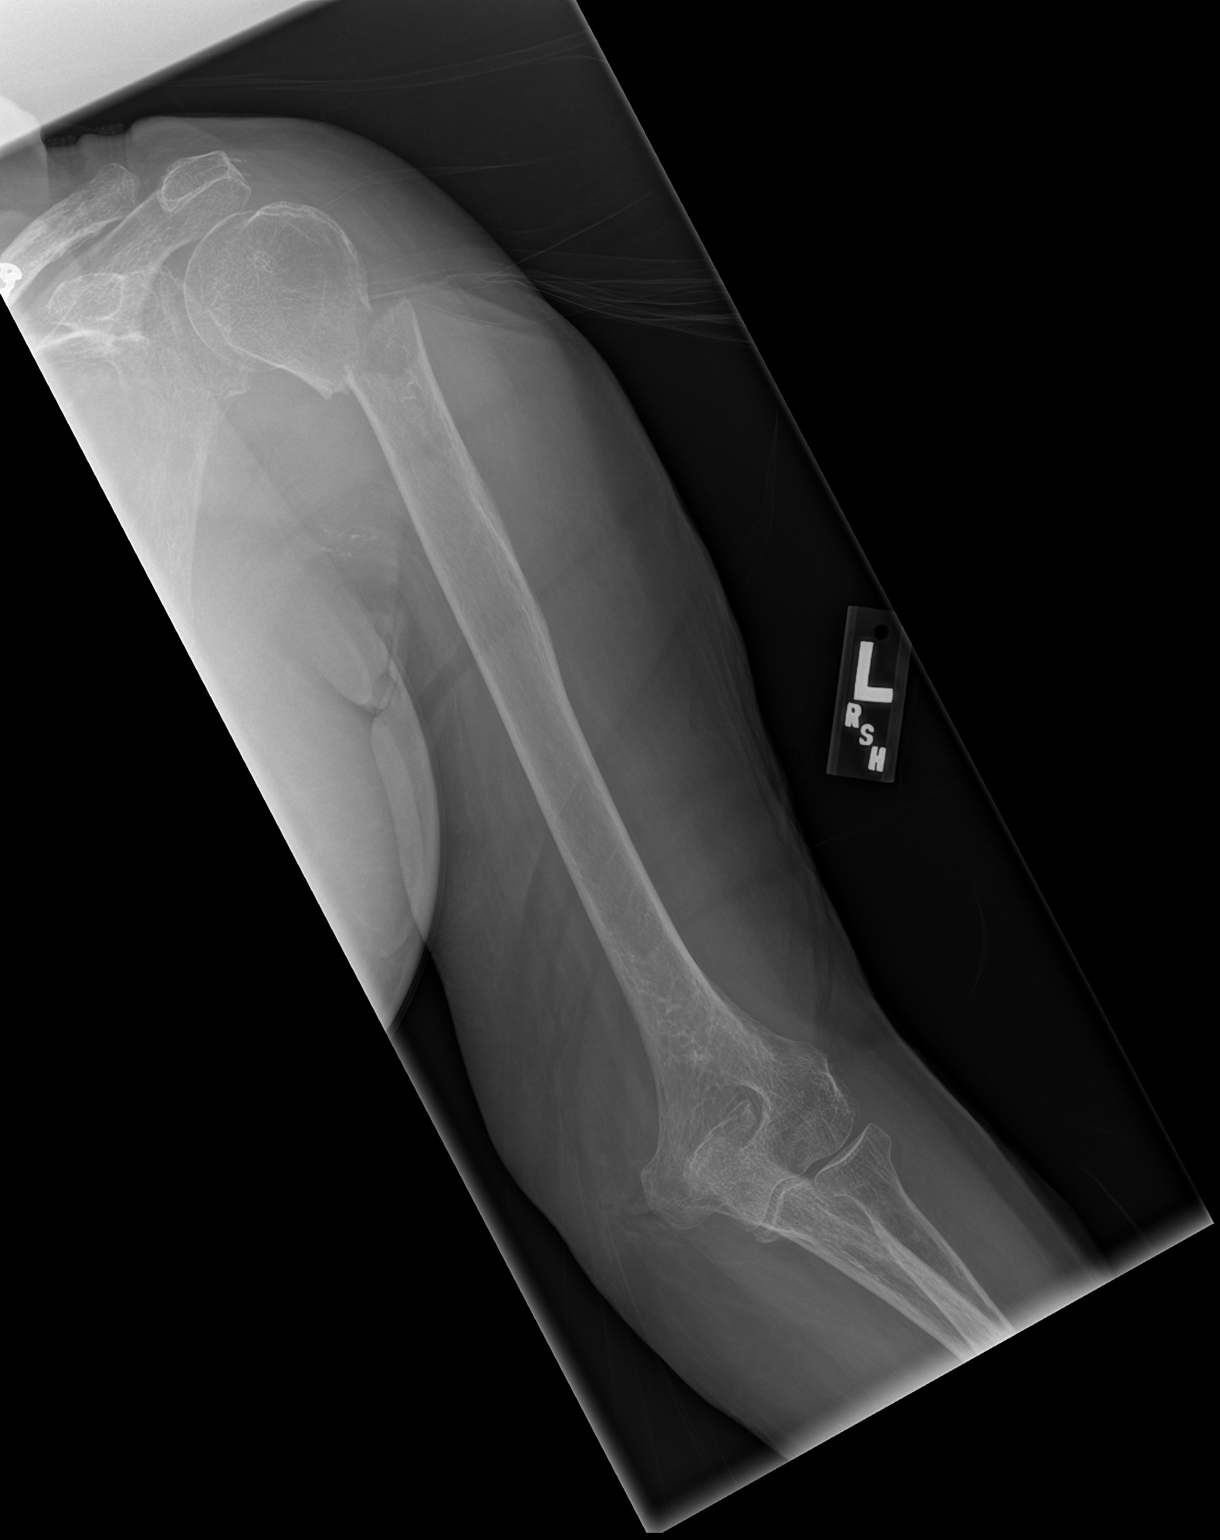

[1 of 1 positions shown; findings below may reference images not displayed]

FINDINGS: Transverse fracture through the humeral neck with
displacement laterally.  No other fracture of the humerus.
IMPRESSION: Displaced fracture of the neck of the humerus.

## 2020-01-24 NOTE — Telephone Encounter (Signed)
Closing encounter
